# Patient Record
Sex: Female | Born: 1950 | Race: Black or African American | Hispanic: No | Marital: Married | Smoking: Never smoker
Health system: Southern US, Community
[De-identification: ages and names within clinical notes are randomized; demographics above are authoritative.]

## PROBLEM LIST (undated history)

## (undated) DIAGNOSIS — I10 Essential (primary) hypertension: Secondary | ICD-10-CM

## (undated) DIAGNOSIS — K219 Gastro-esophageal reflux disease without esophagitis: Secondary | ICD-10-CM

## (undated) DIAGNOSIS — M109 Gout, unspecified: Secondary | ICD-10-CM

## (undated) DIAGNOSIS — N183 Chronic kidney disease, stage 3 unspecified: Secondary | ICD-10-CM

## (undated) DIAGNOSIS — T7840XA Allergy, unspecified, initial encounter: Secondary | ICD-10-CM

## (undated) DIAGNOSIS — D649 Anemia, unspecified: Secondary | ICD-10-CM

## (undated) DIAGNOSIS — F419 Anxiety disorder, unspecified: Secondary | ICD-10-CM

## (undated) DIAGNOSIS — M1990 Unspecified osteoarthritis, unspecified site: Secondary | ICD-10-CM

## (undated) DIAGNOSIS — E538 Deficiency of other specified B group vitamins: Secondary | ICD-10-CM

## (undated) HISTORY — DX: Anxiety disorder, unspecified: F41.9

## (undated) HISTORY — DX: Deficiency of other specified B group vitamins: E53.8

## (undated) HISTORY — DX: Allergy, unspecified, initial encounter: T78.40XA

## (undated) HISTORY — PX: TUBAL LIGATION: SHX77

## (undated) HISTORY — DX: Essential (primary) hypertension: I10

## (undated) HISTORY — DX: Gout, unspecified: M10.9

## (undated) HISTORY — DX: Chronic kidney disease, stage 3 unspecified: N18.30

## (undated) HISTORY — DX: Gastro-esophageal reflux disease without esophagitis: K21.9

---

## 2002-06-25 ENCOUNTER — Ambulatory Visit (HOSPITAL_COMMUNITY): Admission: RE | Admit: 2002-06-25 | Discharge: 2002-06-25 | Payer: Self-pay | Admitting: *Deleted

## 2002-06-25 ENCOUNTER — Encounter (INDEPENDENT_AMBULATORY_CARE_PROVIDER_SITE_OTHER): Payer: Self-pay

## 2006-05-04 ENCOUNTER — Emergency Department (HOSPITAL_COMMUNITY): Admission: EM | Admit: 2006-05-04 | Discharge: 2006-05-04 | Payer: Self-pay | Admitting: Family Medicine

## 2007-01-23 ENCOUNTER — Ambulatory Visit: Payer: Self-pay | Admitting: Family Medicine

## 2007-01-23 DIAGNOSIS — I10 Essential (primary) hypertension: Secondary | ICD-10-CM | POA: Insufficient documentation

## 2007-01-23 DIAGNOSIS — R519 Headache, unspecified: Secondary | ICD-10-CM | POA: Insufficient documentation

## 2007-01-23 DIAGNOSIS — R51 Headache: Secondary | ICD-10-CM | POA: Insufficient documentation

## 2007-02-02 ENCOUNTER — Ambulatory Visit: Payer: Self-pay | Admitting: Family Medicine

## 2007-02-10 ENCOUNTER — Encounter: Payer: Self-pay | Admitting: Family Medicine

## 2007-02-24 ENCOUNTER — Ambulatory Visit: Payer: Self-pay | Admitting: Family Medicine

## 2007-02-25 ENCOUNTER — Telehealth: Payer: Self-pay | Admitting: Family Medicine

## 2007-02-25 ENCOUNTER — Ambulatory Visit: Payer: Self-pay | Admitting: Family Medicine

## 2007-02-25 DIAGNOSIS — E538 Deficiency of other specified B group vitamins: Secondary | ICD-10-CM

## 2007-02-25 LAB — CONVERTED CEMR LAB
ALT: 13 units/L (ref 0–35)
Albumin: 2.9 g/dL — ABNORMAL LOW (ref 3.5–5.2)
BUN: 10 mg/dL (ref 6–23)
Basophils Absolute: 0.1 10*3/uL (ref 0.0–0.1)
Bilirubin, Direct: 0.1 mg/dL (ref 0.0–0.3)
Cholesterol: 105 mg/dL (ref 0–200)
Creatinine, Ser: 0.9 mg/dL (ref 0.4–1.2)
Eosinophils Absolute: 0.1 10*3/uL (ref 0.0–0.6)
Eosinophils Relative: 2 % (ref 0.0–5.0)
GFR calc non Af Amer: 69 mL/min
Hemoglobin: 11.8 g/dL — ABNORMAL LOW (ref 12.0–15.0)
LDL Cholesterol: 71 mg/dL (ref 0–99)
Lymphocytes Relative: 24.9 % (ref 12.0–46.0)
Neutro Abs: 4.3 10*3/uL (ref 1.4–7.7)
Neutrophils Relative %: 68 % (ref 43.0–77.0)
Platelets: 347 10*3/uL (ref 150–400)
Potassium: 3.8 meq/L (ref 3.5–5.1)
RDW: 13.2 % (ref 11.5–14.6)
TSH: 0.63 microintl units/mL (ref 0.35–5.50)
Total CHOL/HDL Ratio: 4.3
Total Protein: 7 g/dL (ref 6.0–8.3)
Triglycerides: 48 mg/dL (ref 0–149)
VLDL: 10 mg/dL (ref 0–40)

## 2007-02-27 ENCOUNTER — Encounter: Payer: Self-pay | Admitting: Family Medicine

## 2007-02-27 ENCOUNTER — Ambulatory Visit: Payer: Self-pay

## 2007-02-27 ENCOUNTER — Telehealth: Payer: Self-pay | Admitting: Family Medicine

## 2007-03-06 ENCOUNTER — Ambulatory Visit: Payer: Self-pay | Admitting: Family Medicine

## 2007-03-16 ENCOUNTER — Ambulatory Visit: Payer: Self-pay | Admitting: Family Medicine

## 2007-03-23 ENCOUNTER — Ambulatory Visit: Payer: Self-pay | Admitting: Family Medicine

## 2007-03-30 ENCOUNTER — Ambulatory Visit: Payer: Self-pay | Admitting: Family Medicine

## 2007-04-01 ENCOUNTER — Ambulatory Visit: Payer: Self-pay | Admitting: Family Medicine

## 2007-04-01 DIAGNOSIS — R531 Weakness: Secondary | ICD-10-CM

## 2007-04-02 LAB — CONVERTED CEMR LAB: Vitamin B-12: >1500 pg/mL — ABNORMAL HIGH (ref 211–911)

## 2007-04-06 ENCOUNTER — Ambulatory Visit: Payer: Self-pay | Admitting: Cardiology

## 2007-04-13 ENCOUNTER — Ambulatory Visit: Payer: Self-pay | Admitting: Family Medicine

## 2007-04-13 LAB — CONVERTED CEMR LAB
Bilirubin Urine: NEGATIVE
pH: 7

## 2007-04-14 LAB — CONVERTED CEMR LAB
Alkaline Phosphatase: 76 units/L (ref 39–117)
BUN: 11 mg/dL (ref 6–23)
Basophils Absolute: 0 10*3/uL (ref 0.0–0.1)
Basophils Relative: 0.2 % (ref 0.0–1.0)
Bilirubin, Direct: 0.1 mg/dL (ref 0.0–0.3)
Calcium: 8.9 mg/dL (ref 8.4–10.5)
Chloride: 98 meq/L (ref 96–112)
Cholesterol: 121 mg/dL (ref 0–200)
Creatinine, Ser: 0.8 mg/dL (ref 0.4–1.2)
Eosinophils Absolute: 0.1 10*3/uL (ref 0.0–0.6)
Eosinophils Relative: 1.6 % (ref 0.0–5.0)
Glucose, Bld: 93 mg/dL (ref 70–99)
HCT: 35.1 % — ABNORMAL LOW (ref 36.0–46.0)
Lymphocytes Relative: 29.5 % (ref 12.0–46.0)
Monocytes Relative: 7.9 % (ref 3.0–11.0)
Neutrophils Relative %: 60.8 % (ref 43.0–77.0)
TSH: 1.19 microintl units/mL (ref 0.35–5.50)
Total CHOL/HDL Ratio: 3.9
VLDL: 11 mg/dL (ref 0–40)
WBC: 6.5 10*3/uL (ref 4.5–10.5)

## 2007-04-20 ENCOUNTER — Ambulatory Visit: Payer: Self-pay | Admitting: Family Medicine

## 2007-04-20 DIAGNOSIS — F411 Generalized anxiety disorder: Secondary | ICD-10-CM | POA: Insufficient documentation

## 2007-04-27 ENCOUNTER — Encounter: Payer: Self-pay | Admitting: Family Medicine

## 2007-04-27 ENCOUNTER — Ambulatory Visit: Payer: Self-pay | Admitting: Family Medicine

## 2007-04-27 ENCOUNTER — Other Ambulatory Visit: Admission: RE | Admit: 2007-04-27 | Discharge: 2007-04-27 | Payer: Self-pay | Admitting: Family Medicine

## 2007-05-05 ENCOUNTER — Encounter: Payer: Self-pay | Admitting: Family Medicine

## 2007-05-13 ENCOUNTER — Telehealth: Payer: Self-pay | Admitting: Family Medicine

## 2007-05-28 ENCOUNTER — Ambulatory Visit: Payer: Self-pay | Admitting: Family Medicine

## 2007-06-04 ENCOUNTER — Ambulatory Visit: Payer: Self-pay | Admitting: Gastroenterology

## 2007-06-22 ENCOUNTER — Ambulatory Visit: Payer: Self-pay | Admitting: Family Medicine

## 2007-07-01 ENCOUNTER — Ambulatory Visit: Payer: Self-pay | Admitting: Family Medicine

## 2007-07-13 ENCOUNTER — Encounter: Payer: Self-pay | Admitting: Gastroenterology

## 2007-07-13 ENCOUNTER — Ambulatory Visit: Payer: Self-pay | Admitting: Gastroenterology

## 2007-07-13 ENCOUNTER — Encounter: Payer: Self-pay | Admitting: Family Medicine

## 2007-07-13 HISTORY — PX: COLONOSCOPY: SHX174

## 2007-07-22 ENCOUNTER — Ambulatory Visit: Payer: Self-pay | Admitting: Family Medicine

## 2007-08-19 ENCOUNTER — Ambulatory Visit: Payer: Self-pay | Admitting: Family Medicine

## 2007-10-02 ENCOUNTER — Ambulatory Visit: Payer: Self-pay | Admitting: Family Medicine

## 2007-10-09 ENCOUNTER — Ambulatory Visit: Payer: Self-pay | Admitting: Family Medicine

## 2007-11-09 ENCOUNTER — Ambulatory Visit: Payer: Self-pay | Admitting: Family Medicine

## 2007-12-07 ENCOUNTER — Ambulatory Visit: Payer: Self-pay | Admitting: Family Medicine

## 2008-01-08 ENCOUNTER — Ambulatory Visit: Payer: Self-pay | Admitting: Family Medicine

## 2008-02-08 ENCOUNTER — Ambulatory Visit: Payer: Self-pay | Admitting: Family Medicine

## 2008-03-07 ENCOUNTER — Ambulatory Visit: Payer: Self-pay | Admitting: Family Medicine

## 2008-03-07 DIAGNOSIS — J351 Hypertrophy of tonsils: Secondary | ICD-10-CM | POA: Insufficient documentation

## 2008-04-04 ENCOUNTER — Ambulatory Visit: Payer: Self-pay | Admitting: Family Medicine

## 2008-05-06 ENCOUNTER — Ambulatory Visit: Payer: Self-pay | Admitting: Family Medicine

## 2008-06-10 ENCOUNTER — Ambulatory Visit: Payer: Self-pay | Admitting: Family Medicine

## 2008-07-08 ENCOUNTER — Ambulatory Visit: Payer: Self-pay | Admitting: Family Medicine

## 2008-08-12 ENCOUNTER — Ambulatory Visit: Payer: Self-pay | Admitting: Family Medicine

## 2008-09-09 ENCOUNTER — Ambulatory Visit: Payer: Self-pay | Admitting: Family Medicine

## 2008-10-12 ENCOUNTER — Ambulatory Visit: Payer: Self-pay | Admitting: Family Medicine

## 2008-11-10 ENCOUNTER — Ambulatory Visit: Payer: Self-pay | Admitting: Family Medicine

## 2008-12-13 ENCOUNTER — Ambulatory Visit: Payer: Self-pay | Admitting: Family Medicine

## 2008-12-23 ENCOUNTER — Ambulatory Visit: Payer: Self-pay | Admitting: Family Medicine

## 2008-12-23 LAB — CONVERTED CEMR LAB
Bilirubin Urine: NEGATIVE
Ketones, urine, test strip: NEGATIVE
Protein, U semiquant: NEGATIVE
WBC Urine, dipstick: NEGATIVE
pH: 5.5

## 2008-12-26 LAB — CONVERTED CEMR LAB
AST: 16 units/L (ref 0–37)
Alkaline Phosphatase: 80 units/L (ref 39–117)
BUN: 14 mg/dL (ref 6–23)
Basophils Absolute: 0.2 10*3/uL — ABNORMAL HIGH (ref 0.0–0.1)
Basophils Relative: 2.4 % (ref 0.0–3.0)
CO2: 34 meq/L — ABNORMAL HIGH (ref 19–32)
Chloride: 105 meq/L (ref 96–112)
Cholesterol: 112 mg/dL (ref 0–200)
Creatinine, Ser: 0.9 mg/dL (ref 0.4–1.2)
Eosinophils Relative: 1.8 % (ref 0.0–5.0)
GFR calc non Af Amer: 82.96 mL/min (ref >60)
HCT: 36.1 % (ref 36.0–46.0)
HDL: 33.2 mg/dL — ABNORMAL LOW (ref >39.00)
Hemoglobin: 12 g/dL (ref 12.0–15.0)
LDL Cholesterol: 68 mg/dL (ref 0–99)
MCV: 90.6 fL (ref 78.0–100.0)
Monocytes Absolute: 0.5 10*3/uL (ref 0.1–1.0)
RBC: 3.99 M/uL (ref 3.87–5.11)
Sodium: 144 meq/L (ref 135–145)
Total Bilirubin: 0.8 mg/dL (ref 0.3–1.2)
Total CHOL/HDL Ratio: 3
Triglycerides: 56 mg/dL (ref 0.0–149.0)
WBC: 6.4 10*3/uL (ref 4.5–10.5)

## 2009-01-02 ENCOUNTER — Encounter: Payer: Self-pay | Admitting: Family Medicine

## 2009-01-02 ENCOUNTER — Ambulatory Visit: Payer: Self-pay | Admitting: Family Medicine

## 2009-01-02 ENCOUNTER — Other Ambulatory Visit: Admission: RE | Admit: 2009-01-02 | Discharge: 2009-01-02 | Payer: Self-pay | Admitting: Family Medicine

## 2009-01-05 ENCOUNTER — Encounter: Payer: Self-pay | Admitting: Family Medicine

## 2009-01-11 ENCOUNTER — Ambulatory Visit: Payer: Self-pay | Admitting: Family Medicine

## 2009-02-09 ENCOUNTER — Ambulatory Visit: Payer: Self-pay | Admitting: Family Medicine

## 2009-03-24 ENCOUNTER — Ambulatory Visit: Payer: Self-pay | Admitting: Family Medicine

## 2009-05-12 ENCOUNTER — Ambulatory Visit: Payer: Self-pay | Admitting: Family Medicine

## 2009-06-16 ENCOUNTER — Ambulatory Visit: Payer: Self-pay | Admitting: Family Medicine

## 2009-07-09 ENCOUNTER — Emergency Department (HOSPITAL_COMMUNITY): Admission: EM | Admit: 2009-07-09 | Discharge: 2009-07-09 | Payer: Self-pay | Admitting: Family Medicine

## 2009-07-21 ENCOUNTER — Ambulatory Visit: Payer: Self-pay | Admitting: Family Medicine

## 2009-08-18 ENCOUNTER — Ambulatory Visit: Payer: Self-pay | Admitting: Family Medicine

## 2009-09-15 ENCOUNTER — Ambulatory Visit: Payer: Self-pay | Admitting: Family Medicine

## 2009-10-12 ENCOUNTER — Ambulatory Visit: Payer: Self-pay | Admitting: Family Medicine

## 2009-11-17 ENCOUNTER — Ambulatory Visit: Payer: Self-pay | Admitting: Family Medicine

## 2009-12-27 ENCOUNTER — Ambulatory Visit: Payer: Self-pay | Admitting: Family Medicine

## 2010-01-23 ENCOUNTER — Telehealth: Payer: Self-pay | Admitting: Family Medicine

## 2010-01-24 ENCOUNTER — Telehealth: Payer: Self-pay | Admitting: Family Medicine

## 2010-01-29 ENCOUNTER — Ambulatory Visit: Payer: Self-pay | Admitting: Family Medicine

## 2010-02-01 ENCOUNTER — Telehealth: Payer: Self-pay | Admitting: Family Medicine

## 2010-02-01 LAB — CONVERTED CEMR LAB
ALT: 14 units/L (ref 0–35)
Albumin: 2.9 g/dL — ABNORMAL LOW (ref 3.5–5.2)
BUN: 14 mg/dL (ref 6–23)
Basophils Absolute: 0 10*3/uL (ref 0.0–0.1)
Bilirubin, Direct: 0 mg/dL (ref 0.0–0.3)
CO2: 34 meq/L — ABNORMAL HIGH (ref 19–32)
Eosinophils Absolute: 0.1 10*3/uL (ref 0.0–0.7)
Eosinophils Relative: 1.9 % (ref 0.0–5.0)
HCT: 35.4 % — ABNORMAL LOW (ref 36.0–46.0)
Hemoglobin: 12 g/dL (ref 12.0–15.0)
Leukocytes, UA: NEGATIVE
MCHC: 34 g/dL (ref 30.0–36.0)
MCV: 89 fL (ref 78.0–100.0)
Monocytes Absolute: 0.3 10*3/uL (ref 0.1–1.0)
Monocytes Relative: 4.6 % (ref 3.0–12.0)
Neutro Abs: 4.8 10*3/uL (ref 1.4–7.7)
Neutrophils Relative %: 67.8 % (ref 43.0–77.0)
Nitrite: NEGATIVE
Sodium: 140 meq/L (ref 135–145)
Total Bilirubin: 0.3 mg/dL (ref 0.3–1.2)
Total CHOL/HDL Ratio: 4
Total Protein: 7 g/dL (ref 6.0–8.3)
Triglycerides: 52 mg/dL (ref 0.0–149.0)
Urine Glucose: NEGATIVE mg/dL
WBC: 7.1 10*3/uL (ref 4.5–10.5)

## 2010-02-05 ENCOUNTER — Encounter: Payer: Self-pay | Admitting: Family Medicine

## 2010-02-05 ENCOUNTER — Ambulatory Visit: Payer: Self-pay | Admitting: Family Medicine

## 2010-02-07 ENCOUNTER — Telehealth: Payer: Self-pay | Admitting: Family Medicine

## 2010-03-07 ENCOUNTER — Ambulatory Visit: Payer: Self-pay | Admitting: Family Medicine

## 2010-04-04 ENCOUNTER — Ambulatory Visit: Payer: Self-pay | Admitting: Family Medicine

## 2010-05-11 ENCOUNTER — Ambulatory Visit
Admission: RE | Admit: 2010-05-11 | Discharge: 2010-05-11 | Payer: Self-pay | Source: Home / Self Care | Attending: Family Medicine | Admitting: Family Medicine

## 2010-05-15 NOTE — Assessment & Plan Note (Signed)
Summary: B12 INJ // RS   Nurse Visit   Allergies: No Known Drug Allergies  Medication Administration  Injection # 1:    Medication: Vit B12 1000 mcg    Diagnosis: VITAMIN B12 DEFICIENCY (ICD-266.2)    Route: IM    Site: L deltoid    Exp Date: 02/13    Lot #: [id number]    Mfr: American Regent    Patient tolerated injection without complications    Given by: Raechel Ache, RN (October 12, 2009 9:13 AM)  Orders Added: 1)  Vit B12 1000 mcg [J3420] 2)  Admin of Therapeutic Inj  intramuscular or subcutaneous [54098]

## 2010-05-15 NOTE — Assessment & Plan Note (Signed)
Summary: B12 INJ//CCM rt delt   Nurse Visit   Allergies: No Known Drug Allergies  Medication Administration  Injection # 1:    Medication: Vit B12 1000 mcg    Diagnosis: VITAMIN B12 DEFICIENCY (ICD-266.2)    Route: IM    Site: R deltoid    Exp Date: 09/2011    Lot #: [id number]    Mfr: American Regent    Patient tolerated injection without complications    Given by: Pura Spice, RN (December 27, 2009 2:57 PM)  Orders Added: 1)  Vit B12 1000 mcg [J3420] 2)  Admin of Therapeutic Inj  intramuscular or subcutaneous [16109]

## 2010-05-15 NOTE — Assessment & Plan Note (Signed)
Summary: b12 inj/njr   Nurse Visit   Allergies: No Known Drug Allergies  Medication Administration  Injection # 1:    Medication: Vit B12 1000 mcg    Diagnosis: VITAMIN B12 DEFICIENCY (ICD-266.2)    Route: IM    Site: L deltoid    Exp Date: 09/12    Lot #: [id number]    Mfr: American Regent    Patient tolerated injection without complications    Given by: Raechel Ache, RN (November 17, 2009 2:51 PM)  Orders Added: 1)  Vit B12 1000 mcg [J3420] 2)  Admin of Therapeutic Inj  intramuscular or subcutaneous [11914]

## 2010-05-15 NOTE — Assessment & Plan Note (Signed)
Summary: CPX--PAP//CCM   Vital Signs:  Patient profile:   60 year old female Height:      62 inches Weight:      216 pounds BMI:     39.65 O2 Sat:      95 % Temp:     98.4 degrees F Pulse rate:   66 / minute BP sitting:   114 / 80  (left arm) Cuff size:   large  Vitals Entered By: Pura Spice, RN (February 05, 2010 1:54 PM) CC: cpx no pap  Is Patient Diabetic? No   History of Present Illness: 60 yr old female for a cpx. She feels well and has no concerns.   Allergies (verified): 1)  ! Ace Inhibitors  Past History:  Past Medical History: Reviewed history from 07/01/2007 and no changes required. Headache Hypertension Anxiety B12 deficiency   Past Surgical History: Reviewed history from 01/02/2009 and no changes required. Tubal ligation colonoscopy 07-13-07 per Dr. Russella Dar, repeat 5 yrs  Family History: Reviewed history from 02/24/2007 and no changes required. Family History of Colon CA 1st degree relative <60 Family History of Endometrial cancer Family History Hypertension Family History Arthritis Family History Diabetes 1st degree relative  Social History: Reviewed history from 01/23/2007 and no changes required. Married Never Smoked Alcohol use-no Drug use-no Regular exercise-no  Review of Systems  The patient denies anorexia, fever, weight loss, weight gain, vision loss, decreased hearing, hoarseness, chest pain, syncope, dyspnea on exertion, peripheral edema, prolonged cough, headaches, hemoptysis, abdominal pain, melena, hematochezia, severe indigestion/heartburn, hematuria, incontinence, genital sores, muscle weakness, suspicious skin lesions, transient blindness, difficulty walking, depression, unusual weight change, abnormal bleeding, enlarged lymph nodes, angioedema, breast masses, and testicular masses.    Physical Exam  General:  overweight-appearing.   Head:  Normocephalic and atraumatic without obvious abnormalities. No apparent alopecia or  balding. Eyes:  No corneal or conjunctival inflammation noted. EOMI. Perrla. Funduscopic exam benign, without hemorrhages, exudates or papilledema. Vision grossly normal. Ears:  External ear exam shows no significant lesions or deformities.  Otoscopic examination reveals clear canals, tympanic membranes are intact bilaterally without bulging, retraction, inflammation or discharge. Hearing is grossly normal bilaterally. Nose:  External nasal examination shows no deformity or inflammation. Nasal mucosa are pink and moist without lesions or exudates. Mouth:  Oral mucosa and oropharynx without lesions or exudates.  Teeth in good repair. Neck:  No deformities, masses, or tenderness noted. Chest Wall:  No deformities, masses, or tenderness noted. Breasts:  No mass, nodules, thickening, tenderness, bulging, retraction, inflamation, nipple discharge or skin changes noted.   Lungs:  Normal respiratory effort, chest expands symmetrically. Lungs are clear to auscultation, no crackles or wheezes. Heart:  Normal rate and regular rhythm. S1 and S2 normal without gallop, murmur, click, rub or other extra sounds. EKG normal except first degree AV block Abdomen:  Bowel sounds positive,abdomen soft and non-tender without masses, organomegaly or hernias noted. Msk:  No deformity or scoliosis noted of thoracic or lumbar spine.   Pulses:  R and L carotid,radial,femoral,dorsalis pedis and posterior tibial pulses are full and equal bilaterally Extremities:  No clubbing, cyanosis, edema, or deformity noted with normal full range of motion of all joints.   Neurologic:  No cranial nerve deficits noted. Station and gait are normal. Plantar reflexes are down-going bilaterally. DTRs are symmetrical throughout. Sensory, motor and coordinative functions appear intact. Skin:  Intact without suspicious lesions or rashes Cervical Nodes:  No lymphadenopathy noted Axillary Nodes:  No palpable lymphadenopathy Inguinal Nodes:  No  significant adenopathy Psych:  Cognition and judgment appear intact. Alert and cooperative with normal attention span and concentration. No apparent delusions, illusions, hallucinations   Impression & Recommendations:  Problem # 1:  PHYSICAL EXAMINATION (ICD-V70.0)  Orders: EKG w/ Interpretation (93000)  Complete Medication List: 1)  Hydrochlorothiazide 25 Mg Tabs (Hydrochlorothiazide) .... Take 1 tab by mouth every morning 2)  Aspir-low 81 Mg Tbec (Aspirin) .Marland Kitchen.. 1 once daily pc 3)  Metoprolol Tartrate 50 Mg Tabs (Metoprolol tartrate) .Marland Kitchen.. 1 by mouth two times a day 4)  Norvasc 5 Mg Tabs (Amlodipine besylate) .... Once daily 5)  Klor-con M10 10 Meq Cr-tabs (Potassium chloride crys cr) .... Once daily  Other Orders: Venipuncture (46962) TLB-B12, Serum-Total ONLY (95284-X32)  Patient Instructions: 1)  Start on potassium daily.  2)  It is important that you exercise reguarly at least 20 minutes 5 times a week. If you develop chest pain, have severe difficulty breathing, or feel very tired, stop exercising immediately and seek medical attention.  3)  You need to lose weight. Consider a lower calorie diet and regular exercise.  4)  Please schedule a follow-up appointment in 6 months .  Prescriptions: KLOR-CON M10 10 MEQ CR-TABS (POTASSIUM CHLORIDE CRYS CR) once daily  #30 x 11   Entered and Authorized by:   Nelwyn Salisbury MD   Signed by:   Nelwyn Salisbury MD on 02/05/2010   Method used:   Electronically to        [facility] [phone]* (retail)       [address]       [place], [place]  [address]       Ph: [phone]       Fax: [phone]   RxID:   [id number]    Medication Administration  Injection # 1:    Medication: Vit B12 1000 mcg    Diagnosis: VITAMIN B12 DEFICIENCY (ICD-266.2)    Route: IM    Site: R deltoid    Exp Date: 10/2011    Lot #: [id number]    Mfr: American Regent    Patient tolerated injection without complications    Given by: Pura Spice, RN  (February 05, 2010 2:16 PM)  Orders Added: 1)  Est. Patient 40-64 years [99396] 2)  EKG w/ Interpretation [93000] 3)  Venipuncture [36415] 4)  TLB-B12, Serum-Total ONLY [01601-U93]  Appended Document: Orders Update     Clinical Lists Changes  Orders: Added new Service order of Specimen Handling (23557) - Signed

## 2010-05-15 NOTE — Assessment & Plan Note (Signed)
Summary: b12 inj//ccm   Nurse Visit   Allergies: No Known Drug Allergies  Medication Administration  Injection # 1:    Medication: Vit B12 1000 mcg    Diagnosis: VITAMIN B12 DEFICIENCY (ICD-266.2)    Route: IM    Site: L deltoid    Exp Date: 12/2010    Lot #: [id number]    Mfr: American Regent    Patient tolerated injection without complications    Given by: Duard Brady LPN (July 21, 1476 3:03 PM)  Orders Added: 1)  Vit B12 1000 mcg [J3420] 2)  Admin of Therapeutic Inj  intramuscular or subcutaneous [29562]

## 2010-05-15 NOTE — Assessment & Plan Note (Signed)
Summary: b-12 inj/cjr   Nurse Visit   Allergies: 1)  ! Ace Inhibitors  Medication Administration  Injection # 1:    Medication: Vit B12 1000 mcg    Diagnosis: VITAMIN B12 DEFICIENCY (ICD-266.2)    Route: IM    Site: L deltoid    Exp Date: 10/14/2011    Lot #: [id number]    Mfr: American Regent    Patient tolerated injection without complications    Given by: Romualdo Bolk, CMA (AAMA) (March 07, 2010 9:09 AM)  Orders Added: 1)  Vit B12 1000 mcg [J3420] 2)  Admin of Therapeutic Inj  intramuscular or subcutaneous [15176]

## 2010-05-15 NOTE — Progress Notes (Signed)
Summary: rx to [facility] [address]   Phone Note Call from Patient   Caller: Patient Summary of Call: wants norvasc called to [facility] [address]  [facility] [address] notified.................... Initial call taken by: Pura Spice, RN,  January 24, 2010 4:46 PM  Follow-up for Phone Call        done.     Prescriptions: NORVASC 5 MG  TABS (AMLODIPINE BESYLATE) once daily  #30 x 6   Entered by:   Pura Spice, RN   Authorized by:   Nelwyn Salisbury MD   Signed by:   Pura Spice, RN on 01/24/2010   Method used:   Electronically to        [facility] [phone]* (retail)       [address]       [place], [place]  [address]       Ph: [phone]       Fax: [phone]   RxID:   [id number]

## 2010-05-15 NOTE — Assessment & Plan Note (Signed)
Summary: B12 INJ // RS   Nurse Visit   Allergies: No Known Drug Allergies  Medication Administration  Injection # 1:    Medication: Vit B12 1000 mcg    Diagnosis: VITAMIN B12 DEFICIENCY (ICD-266.2)    Route: IM    Site: L deltoid    Exp Date: 9/12    Lot #: [id number]    Mfr: American Regent    Patient tolerated injection without complications    Given by: Alfred Levins, CMA (May 12, 2009 2:58 PM)  Orders Added: 1)  Vit B12 1000 mcg [J3420] 2)  Admin of Therapeutic Inj  intramuscular or subcutaneous [40981]

## 2010-05-15 NOTE — Assessment & Plan Note (Signed)
Summary: B-12/CJR   Nurse Visit   Allergies: No Known Drug Allergies  Medication Administration  Injection # 1:    Medication: Vit B12 1000 mcg    Diagnosis: VITAMIN B12 DEFICIENCY (ICD-266.2)    Route: IM    Site: L deltoid    Exp Date: 9/12    Lot #: [id number]    Mfr: American Regent    Patient tolerated injection without complications    Given by: Alfred Levins, CMA (June 16, 2009 2:52 PM)  Orders Added: 1)  Vit B12 1000 mcg [J3420] 2)  Admin of Therapeutic Inj  intramuscular or subcutaneous [09811]

## 2010-05-15 NOTE — Progress Notes (Signed)
Summary: rtc  Phone Note Call from Patient Call back at Work Phone [phone]   Caller: Patient Call For: Nelwyn Salisbury MD Summary of Call: pt is return gina call Initial call taken by: Heron Sabins,  February 01, 2010 10:29 AM  Follow-up for Phone Call        pt called at number requested and a mess left at work to return call  Follow-up by: Pura Spice, RN,  February 01, 2010 10:39 AM  Additional Follow-up for Phone Call Additional follow up Details #1::        called pt again at home -messs left to return call.  Additional Follow-up by: Pura Spice, RN,  February 02, 2010 8:34 AM    Additional Follow-up for Phone Call Additional follow up Details #2::    pt has appt monday 02-05-10 will discusss lab then  Follow-up by: Pura Spice, RN,  February 02, 2010 10:01 AM

## 2010-05-15 NOTE — Assessment & Plan Note (Signed)
Summary: B-12 INJ/CJR   Nurse Visit   Allergies: No Known Drug Allergies  Medication Administration  Injection # 1:    Medication: Vit B12 1000 mcg    Diagnosis: VITAMIN B12 DEFICIENCY (ICD-266.2)    Route: IM    Site: R deltoid    Exp Date: 09/12    Lot #: [id number]    Mfr: American Regent    Patient tolerated injection without complications    Given by: Raechel Ache, RN (September 15, 2009 2:54 PM)  Orders Added: 1)  Vit B12 1000 mcg [J3420] 2)  Admin of Therapeutic Inj  intramuscular or subcutaneous [06301]

## 2010-05-15 NOTE — Progress Notes (Signed)
Summary: b12 inj   Phone Note Call from Patient   Caller: Patient Summary of Call: pt wants to know if she needs to  cont b12 inj ?  Initial call taken by: Pura Spice, RN,  February 07, 2010 11:21 AM  Follow-up for Phone Call        yes she needs to stay on monthly B12 shots because her level would drop again if we stopped  Follow-up by: Nelwyn Salisbury MD,  February 07, 2010 12:29 PM  Additional Follow-up for Phone Call Additional follow up Details #1::        pt notifed  Additional Follow-up by: Pura Spice, RN,  February 07, 2010 12:52 PM

## 2010-05-15 NOTE — Assessment & Plan Note (Signed)
Summary: b12 inj//ccm   Nurse Visit   Allergies: No Known Drug Allergies  Medication Administration  Injection # 1:    Medication: Vit B12 1000 mcg    Diagnosis: VITAMIN B12 DEFICIENCY (ICD-266.2)    Route: IM    Site: L deltoid    Exp Date: 09/12    Lot #: [id number]    Mfr: American Regent    Patient tolerated injection without complications    Given by: Raechel Ache, RN (Aug 18, 2009 9:08 AM)  Orders Added: 1)  Vit B12 1000 mcg [J3420] 2)  Admin of Therapeutic Inj  intramuscular or subcutaneous [58527]

## 2010-05-15 NOTE — Progress Notes (Signed)
Summary: Rx Refill  Phone Note Refill Request Call back at Work Phone [phone] Call back at [phone] wk Message from:  Patient on January 23, 2010 12:51 PM  Refills Requested: Medication #1:  NORVASC 5 MG  TABS once daily. pt has appt scheduled next week for cpx however took last pill today   Method Requested: Electronic Initial call taken by: Trixie Dredge,  January 23, 2010 12:53 PM  Follow-up for Phone Call        done [facility] to [facility] [address]  Follow-up by: Pura Spice, RN,  January 23, 2010 1:16 PM    New/Updated Medications: NORVASC 5 MG  TABS (AMLODIPINE BESYLATE) once daily Prescriptions: NORVASC 5 MG  TABS (AMLODIPINE BESYLATE) once daily  #30 x 6   Entered by:   Pura Spice, RN   Authorized by:   Nelwyn Salisbury MD   Signed by:   Pura Spice, RN on 01/23/2010   Method used:   [facility]        [facility]  [address] [id number]       [address]       [place], [place]  [address]       Ph: [phone]       Fax: [phone]   RxID:   [id number]

## 2010-05-17 NOTE — Assessment & Plan Note (Signed)
Summary: b12 inj//ccm rt deltoid    Nurse Visit   Allergies: 1)  ! Ace Inhibitors  Medication Administration  Injection # 1:    Medication: Vit B12 1000 mcg    Diagnosis: VITAMIN B12 DEFICIENCY (ICD-266.2)    Route: IM    Site: R deltoid    Exp Date: 01/2012    Lot #: [id number]    Mfr: American Regent    Patient tolerated injection without complications    Given by: Pura Spice, RN (April 04, 2010 10:33 AM)  Orders Added: 1)  Vit B12 1000 mcg [J3420] 2)  Admin of Therapeutic Inj  intramuscular or subcutaneous [04540]

## 2010-05-17 NOTE — Assessment & Plan Note (Signed)
Summary: B12 INJ/PT WILL BE HERE 4PM OK PER GINA/NJR left delt   Nurse Visit   Allergies: 1)  ! Ace Inhibitors  Medication Administration  Injection # 1:    Medication: Vit B12 1000 mcg    Diagnosis: VITAMIN B12 DEFICIENCY (ICD-266.2)    Route: IM    Site: L deltoid    Exp Date: 11/2011    Lot #: [id number]    Mfr: American Regent    Patient tolerated injection without complications    Given by: Pura Spice, RN (May 11, 2010 4:05 PM)  Orders Added: 1)  Vit B12 1000 mcg [J3420] 2)  Admin of Therapeutic Inj  intramuscular or subcutaneous [16109]

## 2010-07-05 ENCOUNTER — Encounter: Payer: Self-pay | Admitting: Family Medicine

## 2010-07-06 ENCOUNTER — Ambulatory Visit (INDEPENDENT_AMBULATORY_CARE_PROVIDER_SITE_OTHER): Payer: BC Managed Care – PPO | Admitting: Family Medicine

## 2010-07-06 DIAGNOSIS — D518 Other vitamin B12 deficiency anemias: Secondary | ICD-10-CM

## 2010-07-06 DIAGNOSIS — D519 Vitamin B12 deficiency anemia, unspecified: Secondary | ICD-10-CM

## 2010-07-06 MED ORDER — CYANOCOBALAMIN 1000 MCG/ML IJ SOLN
1000.0000 ug | Freq: Once | INTRAMUSCULAR | Status: AC
  Administered 2010-07-06: 1000 ug via INTRAMUSCULAR

## 2010-08-03 ENCOUNTER — Ambulatory Visit (INDEPENDENT_AMBULATORY_CARE_PROVIDER_SITE_OTHER): Payer: BC Managed Care – PPO | Admitting: Family Medicine

## 2010-08-03 DIAGNOSIS — D649 Anemia, unspecified: Secondary | ICD-10-CM

## 2010-08-03 MED ORDER — CYANOCOBALAMIN 1000 MCG/ML IJ SOLN
1000.0000 ug | Freq: Once | INTRAMUSCULAR | Status: AC
  Administered 2010-08-03: 1000 ug via INTRAMUSCULAR

## 2010-08-21 ENCOUNTER — Telehealth: Payer: Self-pay | Admitting: *Deleted

## 2010-08-21 MED ORDER — AMLODIPINE BESYLATE 5 MG PO TABS: 5.0000 mg | ORAL_TABLET | Freq: Every day | ORAL | Status: DC

## 2010-08-21 NOTE — Telephone Encounter (Signed)
Pt called to request Rx refill for Amlodipine to [facility] [facility].

## 2010-08-31 NOTE — H&P (Signed)
NAME:  Kiara, Ramirez                           ACCOUNT NO.:  [id number]   MEDICAL RECORD NO.:  [id number]                   PATIENT TYPE:  AMB   LOCATION:  SDC                                  FACILITY:  [facility]   PHYSICIAN:  Gerri Spore B. Earlene Plater, M.D.               DATE OF BIRTH:  12-Apr-1951   DATE OF ADMISSION:  DATE OF DISCHARGE:                                HISTORY & PHYSICAL   CHIEF COMPLAINT:  Menorrhagia and irregular bleeding with associated anemia.   HISTORY OF PRESENT ILLNESS:  This 60 year old African-American female  gravida 2, para 2, initially seen in the office at request of Dr. Perrin Maltese for  evaluation of irregular bleeding.  She had been without menses for about six  months previously.  However, for two months prior to being evaluated had had  heavy, irregular vaginal bleeding for up to three weeks at a time.  Has had  no vasomotor symptoms and an associated crampy, lower abdominal pain and  associated fatigue.  Has been found to be anemic and is on iron replacement  twice daily.  Has had recent Pap smear that was normal and TSH normal.  Pelvic ultrasound in the office showed a thickened endometrium with an area  that was hyperechoic and suggestive of endometrial polyp and also multiple  uterine fibroids in the 2-6 cm range noted.   Management options were discussed with the patient including saline infusion  ultrasound, hysteroscopy, or hysterectomy after a benign endometrial biopsy.  The patient's main concern was to rule out a cancer and was not yet ready  for definitive surgical therapy.  After all the options were discussed with  the patient, she decided that for her the best course would be the  hysteroscopy D&C and biopsy of any abnormal area that was visualized.  If a  polyp is identified, it would be removed at the same time.   PAST MEDICAL HISTORY:  Anemia.   PAST SURGICAL HISTORY:  Tubal ligation.   OBSTETRICAL HISTORY:  Vaginal delivery x2.   CURRENT  MEDICATIONS:  Ferrous sulfate 325 mg p.o. daily.   ALLERGIES:  None.   SOCIAL HISTORY:  No alcohol, tobacco, or drugs.   FAMILY HISTORY:  Mother with hypertension and heart failure, renal disease,  diabetes, and liver disease.  Rest is still otherwise noncontributory.   PHYSICAL EXAMINATION:  VITAL SIGNS:  Blood pressure 148/94, weight 206,  height 5 feet 2 inches.  GENERAL:  Alert and oriented, in no acute distress.  SKIN:  Warm, no lesions.  HEART:  Regular rate and rhythm, was clear to auscultation.  ABDOMEN:  Obese.  Liver, spleen normal.  No hernia.  PELVIC:  External genitalia, vagina, cervix normal.  Uterus enlarged, about  15 weeks' size consistent with known fibroids.  No adnexal mass or  tenderness noted.   ASSESSMENT:  Irregular bleeding with associated anemia.  This is at least  in  part due to uterine fibroids but also focally thickened endometrial stripe  suggestive of endometrial polyp.  The patient elects to proceed directly to  hysteroscopy dilatation and curettage for complete visualization of the  endometrial cavity as opposed to initially evaluating with saline sonogram  and/or endometrial biopsy.  Operative risks discussed including infection,  bleeding, uterine perforation, damage to bowel, bladder, or surrounding  organs.  All questions answered.  The patient wishes to proceed.                                               Gerri Spore B. Earlene Plater, M.D.    WBD/MEDQ  D:  06/22/2002  T:  06/22/2002  Job:  [id number]   cc:   Jonita Albee, M.D.  [facility]  [address]  [place]  [place] [address]  Fax: [phone]

## 2010-08-31 NOTE — Op Note (Signed)
NAMEALEXIA, Kiara Ramirez                             ACCOUNT NO.:  [id number]   MEDICAL RECORD NO.:  [id number]                   PATIENT TYPE:  AMB   LOCATION:  SDC                                  FACILITY:  [facility]   PHYSICIAN:  [place] B. Earlene Plater, M.D.               DATE OF BIRTH:  10-24-1950   DATE OF PROCEDURE:  06/25/2002  DATE OF DISCHARGE:                                 OPERATIVE REPORT   PREOPERATIVE DIAGNOSES:  1. Abnormal bleeding.  2. Uterine fibroids.  3. Questionable endometrial polyp on ultrasound.   POSTOPERATIVE DIAGNOSES:  1. Abnormal bleeding.  2. Uterine fibroids.  3. Questionable endometrial polyp on ultrasound.   PROCEDURE:  Hysteroscopy dilatation and curettage.   SURGEON:  Chester Holstein. Earlene Plater, M.D.   ANESTHESIA:  MAC and 10 cc of 1% Nesacaine paracervical block.   FINDINGS:  Eighteen-week size fibroid uterus on examination under  anesthesia.  Hysteroscopic findings shaggy endometrium, large submucosal  fibroid.   SPECIMENS:  Endometrial curettings.   FLUID DEFICIT:  50 mL of sorbitol.   IV FLUIDS:  1000 mL.   ESTIMATED BLOOD LOSS:  50 mL.   URINE OUTPUT:  75 mL preop.   COMPLICATIONS:  None.   INDICATIONS FOR PROCEDURE:  Abnormal bleeding, ultrasound showing multiple  uterine fibroids and an intracavitary mass suggestive of polyp or submucosal  fibroid.  The patient decline saline sonogram as an intermediate diagnostic  step.   PROCEDURE:  The patient was taken to the operating room and MAC anesthesia  obtained.  She was placed in the ski position and prepped and draped in  standard fashion.  The bladder was emptied with a red rubber catheter.  Examination under anesthesia showed the findings as outlined above.   The cervix was blocked with a paracervical block in standard fashion.  The  anterior lip of the cervix was grasped with a toothed tenaculum and the  cervix easily dilated to a #21.  The diagnostic hysteroscope was inserted  after being  flushed with sorbitol.  The endometrial cavity was inspected  with the above findings noted.  The patient was bleeding slightly and this  did limit visualization somewhat.  However, ___________ revealed a large  submucosal fibroid.  Tubal ostia were not visualized due the above  limitations.  Given the shaggy endometrium and no focal endometrial  abnormality noted, gentle sharp curettage was performed.  The instruments  were removed and the cervix was hemostatic.  The patient was taken to the  recovery room awake, alert, and in stable condition.                                               Gerri Spore B. Earlene Plater, M.D.    WBD/MEDQ  D:  06/25/2002  T:  06/25/2002  Job:  [id number]

## 2010-09-07 ENCOUNTER — Ambulatory Visit (INDEPENDENT_AMBULATORY_CARE_PROVIDER_SITE_OTHER): Payer: BC Managed Care – PPO | Admitting: Family Medicine

## 2010-09-07 DIAGNOSIS — E538 Deficiency of other specified B group vitamins: Secondary | ICD-10-CM

## 2010-09-07 MED ORDER — CYANOCOBALAMIN 1000 MCG/ML IJ SOLN
1000.0000 ug | Freq: Once | INTRAMUSCULAR | Status: AC
  Administered 2010-09-07: 1000 ug via INTRAMUSCULAR

## 2010-10-19 ENCOUNTER — Ambulatory Visit (INDEPENDENT_AMBULATORY_CARE_PROVIDER_SITE_OTHER): Payer: BC Managed Care – PPO | Admitting: Family Medicine

## 2010-10-19 DIAGNOSIS — E538 Deficiency of other specified B group vitamins: Secondary | ICD-10-CM

## 2010-10-19 MED ORDER — CYANOCOBALAMIN 1000 MCG/ML IJ SOLN
1000.0000 ug | Freq: Once | INTRAMUSCULAR | Status: AC
  Administered 2010-10-19: 1000 ug via INTRAMUSCULAR

## 2010-11-23 ENCOUNTER — Encounter: Payer: Self-pay | Admitting: Family Medicine

## 2010-11-26 ENCOUNTER — Ambulatory Visit (INDEPENDENT_AMBULATORY_CARE_PROVIDER_SITE_OTHER): Payer: BC Managed Care – PPO | Admitting: Family Medicine

## 2010-11-26 DIAGNOSIS — E539 Vitamin B deficiency, unspecified: Secondary | ICD-10-CM

## 2010-11-26 MED ORDER — CYANOCOBALAMIN 1000 MCG/ML IJ SOLN
1000.0000 ug | Freq: Once | INTRAMUSCULAR | Status: AC
  Administered 2010-11-26: 1000 ug via INTRAMUSCULAR

## 2010-12-25 ENCOUNTER — Other Ambulatory Visit: Payer: Self-pay | Admitting: Family Medicine

## 2010-12-26 ENCOUNTER — Ambulatory Visit (INDEPENDENT_AMBULATORY_CARE_PROVIDER_SITE_OTHER): Payer: BC Managed Care – PPO | Admitting: Family Medicine

## 2010-12-26 DIAGNOSIS — E539 Vitamin B deficiency, unspecified: Secondary | ICD-10-CM

## 2010-12-26 MED ORDER — CYANOCOBALAMIN 1000 MCG/ML IJ SOLN
1000.0000 ug | Freq: Once | INTRAMUSCULAR | Status: AC
  Administered 2010-12-26: 1000 ug via INTRAMUSCULAR

## 2010-12-26 NOTE — Telephone Encounter (Signed)
Script sent e-scribe 

## 2010-12-29 ENCOUNTER — Ambulatory Visit (INDEPENDENT_AMBULATORY_CARE_PROVIDER_SITE_OTHER): Payer: BC Managed Care – PPO | Admitting: Family Medicine

## 2010-12-29 ENCOUNTER — Encounter: Payer: Self-pay | Admitting: Family Medicine

## 2010-12-29 DIAGNOSIS — I10 Essential (primary) hypertension: Secondary | ICD-10-CM

## 2010-12-29 NOTE — Progress Notes (Signed)
  Subjective:    Patient ID: Kiara Ramirez, female    DOB: 02-Apr-1951, 60 y.o.   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 Patient presents with mild L leg pain. Present for approximately 2 months. Slightly worse today. 5/10 in intensity at its worst. The pain is somewhat poorly localized along the calf muscle. No muscle cramps. Achy quality. Denies any low back pain or any weakness or numbness. Nontender to palpation. Occasionally at rest but mostly with walking. She has not noted any color changes to her lower extremity and no visible swelling. No history of coagulopathy. B12 deficiency and takes monthly injections. Hypertension which has been well controlled.  Past Medical History  Diagnosis Date  . Hypertension   . Anxiety   . Headache   . B12 deficiency    Past Surgical History  Procedure Date  . Tubal ligation   . Colonoscopy 07/13/07    per Dr. Russella Dar, repeat in 5 yrs     reports that she has never smoked. She does not have any smokeless tobacco history on file. She reports that she does not drink alcohol or use illicit drugs. family history includes Arthritis in an unspecified family member; Cancer in an unspecified family member; Diabetes in an unspecified family member; and Hypertension in an unspecified family member. Allergies  Allergen Reactions  . Ace Inhibitors      Review of Systems  Constitutional: Negative for fever and chills.  Respiratory: Negative for shortness of breath.   Cardiovascular: Negative for chest pain.  Gastrointestinal: Negative for abdominal pain.  Musculoskeletal: Negative for myalgias, back pain, joint swelling, arthralgias and gait problem.  Skin: Negative for rash.  Neurological: Negative for dizziness, weakness and numbness.  Hematological: Negative for adenopathy.       Objective:   Physical Exam  Constitutional: She appears well-developed and well-nourished. No distress.  Cardiovascular: Normal rate, regular rhythm and normal heart  sounds.   No murmur heard. Pulmonary/Chest: Effort normal and breath sounds normal. No respiratory distress. She has no wheezes. She has no rales.  Musculoskeletal: She exhibits no edema.       Left leg reveals no edema. No color changes. Nontender to palpation. Warm to touch with good capillary refill. 2+ dorsalis pedis pulses bilaterally.  straight leg raise negative  Neurological:        Full strength lower extremities. Symmetric reflexes  Skin: No rash noted.          Assessment & Plan:  Left leg pain. No suspicion clinically for DVT. Nonfocal neurologic exam. Try Aleve 2 tablets every 12 hours as needed. Followup with primary if pain persists

## 2010-12-29 NOTE — Patient Instructions (Signed)
Consider over-the-counter Aleve one to 2 tablets every 12 hours as needed Followup promptly with Dr. Clent Ridges for any swelling, increased pain, or new symptoms.

## 2011-01-25 ENCOUNTER — Other Ambulatory Visit (INDEPENDENT_AMBULATORY_CARE_PROVIDER_SITE_OTHER): Payer: BC Managed Care – PPO

## 2011-01-25 ENCOUNTER — Telehealth: Payer: Self-pay | Admitting: Family Medicine

## 2011-01-25 DIAGNOSIS — E539 Vitamin B deficiency, unspecified: Secondary | ICD-10-CM

## 2011-01-25 DIAGNOSIS — Z0000 Encounter for general adult medical examination without abnormal findings: Secondary | ICD-10-CM

## 2011-01-25 LAB — LIPID PANEL
HDL: 33.1 mg/dL — ABNORMAL LOW (ref >39.00)
LDL Cholesterol: 65 mg/dL (ref 0–99)
Total CHOL/HDL Ratio: 3
VLDL: 14.2 mg/dL (ref 0.0–40.0)

## 2011-01-25 LAB — BASIC METABOLIC PANEL
CO2: 33 mEq/L — ABNORMAL HIGH (ref 19–32)
Calcium: 8.7 mg/dL (ref 8.4–10.5)
GFR: 82.36 mL/min (ref >60.00)
Glucose, Bld: 86 mg/dL (ref 70–99)
Potassium: 3.4 mEq/L — ABNORMAL LOW (ref 3.5–5.1)
Sodium: 142 mEq/L (ref 135–145)

## 2011-01-25 LAB — CBC WITH DIFFERENTIAL/PLATELET
Basophils Absolute: 0 10*3/uL (ref 0.0–0.1)
Basophils Relative: 0.6 % (ref 0.0–3.0)
Eosinophils Relative: 1.4 % (ref 0.0–5.0)
HCT: 38.6 % (ref 36.0–46.0)
Hemoglobin: 12.6 g/dL (ref 12.0–15.0)
Lymphocytes Relative: 22.3 % (ref 12.0–46.0)
Lymphs Abs: 1.5 10*3/uL (ref 0.7–4.0)
Monocytes Relative: 6 % (ref 3.0–12.0)
Neutro Abs: 4.6 10*3/uL (ref 1.4–7.7)
RBC: 4.34 Mil/uL (ref 3.87–5.11)
RDW: 14.3 % (ref 11.5–14.6)

## 2011-01-25 LAB — HEPATIC FUNCTION PANEL
ALT: 12 U/L (ref 0–35)
Bilirubin, Direct: 0 mg/dL (ref 0.0–0.3)
Total Bilirubin: 0.5 mg/dL (ref 0.3–1.2)

## 2011-01-25 LAB — POCT URINALYSIS DIPSTICK
Bilirubin, UA: NEGATIVE
Glucose, UA: NEGATIVE
Ketones, UA: NEGATIVE
Leukocytes, UA: NEGATIVE
Nitrite, UA: NEGATIVE
pH, UA: 7.5

## 2011-01-25 LAB — TSH: TSH: 1.37 u[IU]/mL (ref 0.35–5.50)

## 2011-01-25 LAB — VITAMIN B12: Vitamin B-12: 1036 pg/mL — ABNORMAL HIGH (ref 211–911)

## 2011-01-25 MED ORDER — CYANOCOBALAMIN 1000 MCG/ML IJ SOLN
1000.0000 ug | Freq: Once | INTRAMUSCULAR | Status: AC
  Administered 2011-01-25: 1000 ug via INTRAMUSCULAR

## 2011-01-25 NOTE — Telephone Encounter (Signed)
Spoke with pt and gave results. 

## 2011-01-25 NOTE — Telephone Encounter (Signed)
Message copied by Baldemar Friday on Fri Jan 25, 2011  5:03 PM ------      Message from: Gershon Crane A      Created: Fri Jan 25, 2011  4:38 PM       normal

## 2011-02-08 ENCOUNTER — Other Ambulatory Visit: Payer: Self-pay | Admitting: Family Medicine

## 2011-02-08 DIAGNOSIS — Z1231 Encounter for screening mammogram for malignant neoplasm of breast: Secondary | ICD-10-CM

## 2011-02-09 ENCOUNTER — Other Ambulatory Visit: Payer: Self-pay | Admitting: Family Medicine

## 2011-02-11 ENCOUNTER — Ambulatory Visit (INDEPENDENT_AMBULATORY_CARE_PROVIDER_SITE_OTHER): Payer: BC Managed Care – PPO | Admitting: Family Medicine

## 2011-02-11 ENCOUNTER — Encounter: Payer: Self-pay | Admitting: Family Medicine

## 2011-02-11 ENCOUNTER — Other Ambulatory Visit (HOSPITAL_COMMUNITY)
Admission: RE | Admit: 2011-02-11 | Discharge: 2011-02-11 | Disposition: A | Discharge status: Home / Self Care | Payer: BC Managed Care – PPO | Source: Ambulatory Visit | Attending: Family Medicine | Admitting: Family Medicine

## 2011-02-11 VITALS — BP 118/80 | HR 70 | Temp 98.3°F | Ht 62.25 in | Wt 219.0 lb

## 2011-02-11 DIAGNOSIS — Z0000 Encounter for general adult medical examination without abnormal findings: Secondary | ICD-10-CM

## 2011-02-11 DIAGNOSIS — Z01419 Encounter for gynecological examination (general) (routine) without abnormal findings: Secondary | ICD-10-CM | POA: Insufficient documentation

## 2011-02-11 MED ORDER — POTASSIUM CHLORIDE 10 MEQ PO TBCR: 10.0000 meq | EXTENDED_RELEASE_TABLET | Freq: Two times a day (BID) | ORAL | Status: DC

## 2011-02-11 MED ORDER — METOPROLOL TARTRATE 50 MG PO TABS: 50.0000 mg | ORAL_TABLET | Freq: Two times a day (BID) | ORAL | Status: DC

## 2011-02-11 MED ORDER — AMLODIPINE BESYLATE 5 MG PO TABS: 5.0000 mg | ORAL_TABLET | Freq: Every day | ORAL | Status: DC

## 2011-02-11 MED ORDER — HYDROCHLOROTHIAZIDE 25 MG PO TABS: 25.0000 mg | ORAL_TABLET | Freq: Every day | ORAL | Status: DC

## 2011-02-11 NOTE — Progress Notes (Signed)
Subjective:    Patient ID: Kiara Ramirez, female    DOB: 25-Feb-1951, 60 y.o.   MRN: [id number]  HPI 60 yr old female for a cpx. She feels fine and has no complaints. Her recent labs were notable for a low potassium at 3.4. She tries to watch her diet but does not get much exercise.    Review of Systems  Constitutional: Negative.  Negative for fever, diaphoresis, activity change, appetite change, fatigue and unexpected weight change.  HENT: Negative.  Negative for hearing loss, ear pain, nosebleeds, congestion, sore throat, trouble swallowing, neck pain, neck stiffness, voice change and tinnitus.   Eyes: Negative.  Negative for photophobia, pain, discharge, redness and visual disturbance.  Respiratory: Negative.  Negative for apnea, cough, choking, chest tightness, shortness of breath, wheezing and stridor.   Cardiovascular: Negative.  Negative for chest pain, palpitations and leg swelling.  Gastrointestinal: Negative.  Negative for nausea, vomiting, abdominal pain, diarrhea, constipation, blood in stool, abdominal distention and rectal pain.  Genitourinary: Negative.  Negative for dysuria, urgency, frequency, hematuria, flank pain, vaginal bleeding, vaginal discharge, enuresis, difficulty urinating, vaginal pain and menstrual problem.  Musculoskeletal: Negative.  Negative for myalgias, back pain, joint swelling, arthralgias and gait problem.  Skin: Negative.  Negative for color change, pallor, rash and wound.  Neurological: Negative.  Negative for dizziness, tremors, seizures, syncope, speech difficulty, weakness, light-headedness, numbness and headaches.  Hematological: Negative.  Negative for adenopathy. Does not bruise/bleed easily.  Psychiatric/Behavioral: Negative.  Negative for hallucinations, behavioral problems, confusion, sleep disturbance, dysphoric mood and agitation. The patient is not nervous/anxious.        Objective:   Physical Exam  Constitutional: She appears well-developed  and well-nourished. No distress.  HENT:  Head: Normocephalic and atraumatic.  Right Ear: External ear normal.  Left Ear: External ear normal.  Nose: Nose normal.  Mouth/Throat: Oropharynx is clear and moist. No oropharyngeal exudate.  Eyes: Conjunctivae and EOM are normal. Pupils are equal, round, and reactive to light. Right eye exhibits no discharge. Left eye exhibits no discharge. No scleral icterus.  Neck: Normal range of motion. Neck supple. No JVD present. No thyromegaly present.  Cardiovascular: Normal rate, regular rhythm, normal heart sounds and intact distal pulses.  Exam reveals no gallop and no friction rub.   No murmur heard.      EKG normal   Pulmonary/Chest: Effort normal and breath sounds normal. No stridor. No respiratory distress. She has no wheezes. She has no rales. She exhibits no tenderness.  Abdominal: Soft. Normal appearance and bowel sounds are normal. She exhibits no distension, no abdominal bruit, no ascites and no mass. There is no hepatosplenomegaly. There is no tenderness. There is no rigidity, no rebound and no guarding. No hernia.  Genitourinary: Rectum normal, vagina normal and uterus normal. No breast swelling, tenderness, discharge or bleeding. Cervix exhibits no motion tenderness, no discharge and no friability. Right adnexum displays no mass, no tenderness and no fullness. Left adnexum displays no mass, no tenderness and no fullness. No erythema, tenderness or bleeding around the vagina. No vaginal discharge found.  Musculoskeletal: Normal range of motion. She exhibits no edema and no tenderness.  Lymphadenopathy:    She has no cervical adenopathy.  Neurological: She is alert. She has normal reflexes. No cranial nerve deficit. She exhibits normal muscle tone. Coordination normal.  Skin: Skin is warm and dry. No rash noted. She is not diaphoretic. No erythema. No pallor.  Psychiatric: She has a normal mood and affect. Her behavior is normal.  Judgment and  thought content normal.          Assessment & Plan:  Well exam. Increase potassium to 2 a day. Increase exercise

## 2011-02-19 ENCOUNTER — Encounter: Payer: Self-pay | Admitting: Family Medicine

## 2011-02-22 ENCOUNTER — Encounter: Payer: BC Managed Care – PPO | Admitting: Family Medicine

## 2011-02-26 ENCOUNTER — Ambulatory Visit (INDEPENDENT_AMBULATORY_CARE_PROVIDER_SITE_OTHER): Payer: BC Managed Care – PPO | Admitting: Family Medicine

## 2011-02-26 DIAGNOSIS — E538 Deficiency of other specified B group vitamins: Secondary | ICD-10-CM

## 2011-02-27 MED ORDER — CYANOCOBALAMIN 1000 MCG/ML IJ SOLN
1000.0000 ug | Freq: Once | INTRAMUSCULAR | Status: AC
  Administered 2011-02-27: 1000 ug via INTRAMUSCULAR

## 2011-03-28 ENCOUNTER — Ambulatory Visit (INDEPENDENT_AMBULATORY_CARE_PROVIDER_SITE_OTHER): Payer: BC Managed Care – PPO | Admitting: Family Medicine

## 2011-03-28 DIAGNOSIS — E539 Vitamin B deficiency, unspecified: Secondary | ICD-10-CM

## 2011-03-28 MED ORDER — CYANOCOBALAMIN 1000 MCG/ML IJ SOLN
1000.0000 ug | Freq: Once | INTRAMUSCULAR | Status: AC
  Administered 2011-03-28: 1000 ug via INTRAMUSCULAR

## 2011-05-01 ENCOUNTER — Ambulatory Visit (INDEPENDENT_AMBULATORY_CARE_PROVIDER_SITE_OTHER): Payer: BC Managed Care – PPO | Admitting: Family Medicine

## 2011-05-01 DIAGNOSIS — E539 Vitamin B deficiency, unspecified: Secondary | ICD-10-CM

## 2011-05-01 MED ORDER — CYANOCOBALAMIN 1000 MCG/ML IJ SOLN
1000.0000 ug | Freq: Once | INTRAMUSCULAR | Status: AC
  Administered 2011-05-01: 1000 ug via INTRAMUSCULAR

## 2011-06-05 ENCOUNTER — Ambulatory Visit (INDEPENDENT_AMBULATORY_CARE_PROVIDER_SITE_OTHER): Payer: BC Managed Care – PPO | Admitting: Family Medicine

## 2011-06-05 DIAGNOSIS — E539 Vitamin B deficiency, unspecified: Secondary | ICD-10-CM

## 2011-06-05 MED ORDER — CYANOCOBALAMIN 1000 MCG/ML IJ SOLN
1000.0000 ug | Freq: Once | INTRAMUSCULAR | Status: AC
  Administered 2011-06-05: 1000 ug via INTRAMUSCULAR

## 2011-07-09 ENCOUNTER — Ambulatory Visit (INDEPENDENT_AMBULATORY_CARE_PROVIDER_SITE_OTHER): Payer: BC Managed Care – PPO | Admitting: Family Medicine

## 2011-07-09 DIAGNOSIS — E539 Vitamin B deficiency, unspecified: Secondary | ICD-10-CM

## 2011-07-09 MED ORDER — CYANOCOBALAMIN 1000 MCG/ML IJ SOLN
1000.0000 ug | Freq: Once | INTRAMUSCULAR | Status: AC
  Administered 2011-07-09: 1000 ug via INTRAMUSCULAR

## 2011-08-21 ENCOUNTER — Ambulatory Visit (INDEPENDENT_AMBULATORY_CARE_PROVIDER_SITE_OTHER): Payer: BC Managed Care – PPO | Admitting: Family Medicine

## 2011-08-21 DIAGNOSIS — E539 Vitamin B deficiency, unspecified: Secondary | ICD-10-CM

## 2011-08-21 MED ORDER — CYANOCOBALAMIN 1000 MCG/ML IJ SOLN
1000.0000 ug | Freq: Once | INTRAMUSCULAR | Status: AC
  Administered 2011-08-21: 1000 ug via INTRAMUSCULAR

## 2011-10-18 ENCOUNTER — Ambulatory Visit (INDEPENDENT_AMBULATORY_CARE_PROVIDER_SITE_OTHER): Payer: BC Managed Care – PPO | Admitting: Family Medicine

## 2011-10-18 DIAGNOSIS — E538 Deficiency of other specified B group vitamins: Secondary | ICD-10-CM

## 2011-10-18 MED ORDER — CYANOCOBALAMIN 1000 MCG/ML IJ SOLN
1000.0000 ug | Freq: Once | INTRAMUSCULAR | Status: AC
  Administered 2011-10-18: 1000 ug via INTRAMUSCULAR

## 2011-12-09 ENCOUNTER — Ambulatory Visit (INDEPENDENT_AMBULATORY_CARE_PROVIDER_SITE_OTHER): Payer: BC Managed Care – PPO | Admitting: Family Medicine

## 2011-12-09 DIAGNOSIS — E539 Vitamin B deficiency, unspecified: Secondary | ICD-10-CM

## 2011-12-09 MED ORDER — CYANOCOBALAMIN 1000 MCG/ML IJ SOLN
1000.0000 ug | Freq: Once | INTRAMUSCULAR | Status: AC
  Administered 2011-12-09: 1000 ug via INTRAMUSCULAR

## 2012-01-08 ENCOUNTER — Ambulatory Visit (INDEPENDENT_AMBULATORY_CARE_PROVIDER_SITE_OTHER): Payer: BC Managed Care – PPO | Admitting: Family Medicine

## 2012-01-08 DIAGNOSIS — E539 Vitamin B deficiency, unspecified: Secondary | ICD-10-CM

## 2012-01-08 MED ORDER — CYANOCOBALAMIN 1000 MCG/ML IJ SOLN
1000.0000 ug | Freq: Once | INTRAMUSCULAR | Status: AC
  Administered 2012-01-08: 1000 ug via INTRAMUSCULAR

## 2012-02-11 ENCOUNTER — Ambulatory Visit (INDEPENDENT_AMBULATORY_CARE_PROVIDER_SITE_OTHER): Payer: BC Managed Care – PPO | Admitting: Family Medicine

## 2012-02-11 DIAGNOSIS — E539 Vitamin B deficiency, unspecified: Secondary | ICD-10-CM

## 2012-02-11 MED ORDER — CYANOCOBALAMIN 1000 MCG/ML IJ SOLN
1000.0000 ug | Freq: Once | INTRAMUSCULAR | Status: AC
  Administered 2012-02-11: 1000 ug via INTRAMUSCULAR

## 2012-02-19 ENCOUNTER — Telehealth: Payer: Self-pay | Admitting: Family Medicine

## 2012-02-19 NOTE — Telephone Encounter (Signed)
Pt req refill of hydrochlorothiazide (HYDRODIURIL) 25 MG tablet,  amLODipine (NORVASC) 5 MG tablet, potassium chloride (KLOR-CON) 10 MEQ CR tablet,  metoprolol (LOPRESSOR) 50 MG tablet to [facility] at [employer] on [address]

## 2012-02-21 MED ORDER — METOPROLOL TARTRATE 50 MG PO TABS: 50.0000 mg | ORAL_TABLET | Freq: Two times a day (BID) | ORAL | Status: DC

## 2012-02-21 MED ORDER — HYDROCHLOROTHIAZIDE 25 MG PO TABS: 25.0000 mg | ORAL_TABLET | Freq: Every day | ORAL | Status: DC

## 2012-02-21 MED ORDER — POTASSIUM CHLORIDE ER 10 MEQ PO TBCR: 10.0000 meq | EXTENDED_RELEASE_TABLET | Freq: Every day | ORAL | Status: DC

## 2012-02-21 MED ORDER — AMLODIPINE BESYLATE 5 MG PO TABS: 5.0000 mg | ORAL_TABLET | Freq: Every day | ORAL | Status: DC

## 2012-02-21 NOTE — Telephone Encounter (Signed)
I sent all scripts e-scribe, pt does a office visit scheduled for December 2013.

## 2012-03-05 ENCOUNTER — Ambulatory Visit (INDEPENDENT_AMBULATORY_CARE_PROVIDER_SITE_OTHER): Payer: BC Managed Care – PPO | Admitting: Family Medicine

## 2012-03-05 ENCOUNTER — Encounter: Payer: Self-pay | Admitting: Family Medicine

## 2012-03-05 VITALS — BP 118/82 | HR 59 | Temp 97.9°F | Ht 63.25 in | Wt 213.0 lb

## 2012-03-05 DIAGNOSIS — E539 Vitamin B deficiency, unspecified: Secondary | ICD-10-CM

## 2012-03-05 DIAGNOSIS — Z0000 Encounter for general adult medical examination without abnormal findings: Secondary | ICD-10-CM

## 2012-03-05 DIAGNOSIS — Z136 Encounter for screening for cardiovascular disorders: Secondary | ICD-10-CM

## 2012-03-05 LAB — POCT URINALYSIS DIPSTICK
Bilirubin, UA: NEGATIVE
Blood, UA: NEGATIVE
Ketones, UA: NEGATIVE
Leukocytes, UA: NEGATIVE
Spec Grav, UA: 1.02
pH, UA: 6

## 2012-03-05 LAB — BASIC METABOLIC PANEL
Chloride: 100 mEq/L (ref 96–112)
GFR: 76.16 mL/min (ref >60.00)
Potassium: 3 mEq/L — ABNORMAL LOW (ref 3.5–5.1)
Sodium: 140 mEq/L (ref 135–145)

## 2012-03-05 LAB — LIPID PANEL
LDL Cholesterol: 70 mg/dL (ref 0–99)
VLDL: 10.2 mg/dL (ref 0.0–40.0)

## 2012-03-05 LAB — CBC WITH DIFFERENTIAL/PLATELET
Eosinophils Relative: 0.8 % (ref 0.0–5.0)
HCT: 37.7 % (ref 36.0–46.0)
Hemoglobin: 12.3 g/dL (ref 12.0–15.0)
Lymphs Abs: 1.8 10*3/uL (ref 0.7–4.0)
MCV: 88.5 fl (ref 78.0–100.0)
Monocytes Absolute: 0.4 10*3/uL (ref 0.1–1.0)
Monocytes Relative: 5.9 % (ref 3.0–12.0)
Neutro Abs: 4.3 10*3/uL (ref 1.4–7.7)
Platelets: 344 10*3/uL (ref 150.0–400.0)
RDW: 13.8 % (ref 11.5–14.6)
WBC: 6.5 10*3/uL (ref 4.5–10.5)

## 2012-03-05 LAB — HEPATIC FUNCTION PANEL
AST: 14 U/L (ref 0–37)
Alkaline Phosphatase: 85 U/L (ref 39–117)
Bilirubin, Direct: 0.1 mg/dL (ref 0.0–0.3)
Total Bilirubin: 0.3 mg/dL (ref 0.3–1.2)

## 2012-03-05 LAB — VITAMIN B12: Vitamin B-12: 718 pg/mL (ref 211–911)

## 2012-03-05 MED ORDER — HYDROCHLOROTHIAZIDE 25 MG PO TABS: 25.0000 mg | ORAL_TABLET | Freq: Every day | ORAL | Status: DC

## 2012-03-05 MED ORDER — METOPROLOL TARTRATE 50 MG PO TABS: 50.0000 mg | ORAL_TABLET | Freq: Two times a day (BID) | ORAL | Status: DC

## 2012-03-05 MED ORDER — AMLODIPINE BESYLATE 5 MG PO TABS: 5.0000 mg | ORAL_TABLET | Freq: Every day | ORAL | Status: DC

## 2012-03-05 MED ORDER — POTASSIUM CHLORIDE ER 10 MEQ PO TBCR: 10.0000 meq | EXTENDED_RELEASE_TABLET | Freq: Every day | ORAL | Status: DC

## 2012-03-05 MED ORDER — CYANOCOBALAMIN 1000 MCG/ML IJ SOLN
1000.0000 ug | Freq: Once | INTRAMUSCULAR | Status: AC
  Administered 2012-03-05: 1000 ug via INTRAMUSCULAR

## 2012-03-05 NOTE — Progress Notes (Signed)
  Subjective:    Patient ID: Kiara Ramirez, female    DOB: 08/02/1950, 61 y.o.   MRN: [id number]  HPI 61 yr old female for a cpx. 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 She is stiff and sore after she sits for any length of time.    Review of Systems  Constitutional: Negative.   HENT: Negative.   Eyes: Negative.   Respiratory: Negative.   Cardiovascular: Negative.   Gastrointestinal: Negative.   Genitourinary: Negative for dysuria, urgency, frequency, hematuria, flank pain, decreased urine volume, enuresis, difficulty urinating, pelvic pain and dyspareunia.  Musculoskeletal: Positive for arthralgias. Negative for myalgias, back pain, joint swelling and gait problem.  Skin: Negative.   Neurological: Negative.   Hematological: Negative.   Psychiatric/Behavioral: Negative.        Objective:   Physical Exam  Constitutional: She is oriented to person, place, and time. She appears well-developed and well-nourished. No distress.  HENT:  Head: Normocephalic and atraumatic.  Right Ear: External ear normal.  Left Ear: External ear normal.  Nose: Nose normal.  Mouth/Throat: Oropharynx is clear and moist. No oropharyngeal exudate.  Eyes: Conjunctivae normal and EOM are normal. Pupils are equal, round, and reactive to light. No scleral icterus.  Neck: Normal range of motion. Neck supple. No JVD present. No thyromegaly present.  Cardiovascular: Normal rate, regular rhythm, normal heart sounds and intact distal pulses.  Exam reveals no gallop and no friction rub.   No murmur heard.      EKG normal   Pulmonary/Chest: Effort normal and breath sounds normal. No respiratory distress. She has no wheezes. She has no rales. She exhibits no tenderness.  Abdominal: Soft. Bowel sounds are normal. She exhibits no distension and no mass. There is no tenderness. There is no rebound and no guarding.  Musculoskeletal: Normal range of motion. She exhibits no edema and no  tenderness.  Lymphadenopathy:    She has no cervical adenopathy.  Neurological: She is alert and oriented to person, place, and time. She has normal reflexes. No cranial nerve deficit. She exhibits normal muscle tone. Coordination normal.  Skin: Skin is warm and dry. No rash noted. No erythema.  Psychiatric: She has a normal mood and affect. Her behavior is normal. Judgment and thought content normal.          Assessment & Plan:  Well exam. Get fasting labs. Try Aleve bid for the joint pains.

## 2012-03-09 NOTE — Progress Notes (Signed)
Quick Note:  What about the B12 injections? ______

## 2012-03-10 NOTE — Progress Notes (Signed)
Quick Note:  I spoke with pt and put a copy of results in mail. ______ 

## 2012-03-16 ENCOUNTER — Other Ambulatory Visit: Payer: BC Managed Care – PPO

## 2012-03-23 ENCOUNTER — Encounter: Payer: BC Managed Care – PPO | Admitting: Family Medicine

## 2012-04-14 ENCOUNTER — Ambulatory Visit (INDEPENDENT_AMBULATORY_CARE_PROVIDER_SITE_OTHER): Payer: BC Managed Care – PPO | Admitting: Family Medicine

## 2012-04-14 DIAGNOSIS — E539 Vitamin B deficiency, unspecified: Secondary | ICD-10-CM

## 2012-04-14 DIAGNOSIS — Z23 Encounter for immunization: Secondary | ICD-10-CM

## 2012-04-14 MED ORDER — CYANOCOBALAMIN 1000 MCG/ML IJ SOLN
1000.0000 ug | Freq: Once | INTRAMUSCULAR | Status: AC
  Administered 2012-04-14: 1000 ug via INTRAMUSCULAR

## 2012-06-24 ENCOUNTER — Encounter: Payer: Self-pay | Admitting: Gastroenterology

## 2013-03-14 ENCOUNTER — Encounter (HOSPITAL_COMMUNITY): Payer: Self-pay | Admitting: Emergency Medicine

## 2013-03-14 ENCOUNTER — Emergency Department (HOSPITAL_COMMUNITY)
Admission: EM | Admit: 2013-03-14 | Discharge: 2013-03-14 | Disposition: A | Discharge status: Home / Self Care | Payer: BC Managed Care – PPO | Source: Home / Self Care

## 2013-03-14 DIAGNOSIS — B029 Zoster without complications: Secondary | ICD-10-CM

## 2013-03-14 MED ORDER — VALACYCLOVIR HCL 1 G PO TABS: 1000.0000 mg | ORAL_TABLET | Freq: Three times a day (TID) | ORAL | Status: AC

## 2013-03-14 MED ORDER — LIDOCAINE 5 % EX PTCH: 1.0000 | MEDICATED_PATCH | CUTANEOUS | Status: DC

## 2013-03-14 MED ORDER — METHYLPREDNISOLONE 4 MG PO KIT: PACK | ORAL | Status: DC

## 2013-03-14 MED ORDER — HYDROCODONE-ACETAMINOPHEN 5-325 MG PO TABS: 1.0000 | ORAL_TABLET | ORAL | Status: DC | PRN

## 2013-03-14 NOTE — ED Notes (Signed)
Pt. states she noticed a rash on right lower back on Thanksgiving.  Noted right lower back rash that  is red, has pus filled bumps.  The rash is located lower back and is around right flank area.  Area is painful, has burning sensation.  Denies itching.  Has not used any OTC medications for area.  States pain shoots through her lower back.

## 2013-03-14 NOTE — ED Provider Notes (Signed)
CSN: [id number]     Arrival date & time 03/14/13  3086 History   First MD Initiated Contact with Patient 03/14/13 1013     Chief Complaint  Patient presents with  . Herpes Zoster    Pt. states she noticed a rash on right lower back on Thanksgiving.  Noted right lower back rash that  is red, has pus filled bumps.  The rash is located lower back and is around right flank area.  Area is painful, has burning sensation   (Consider location/radiation/quality/duration/timing/severity/associated sxs/prior Treatment) HPI Comments: 62 y o F with gradual progression of pain in the low back an migrating across the right side of the back to the R anterior axillary line. There is a strip of papulovesicular lesions within a 6 cm band assoc with pain and tenderness. Burning pain Started on 03/11/13 Hx of varicela as a  child   Past Medical History  Diagnosis Date  . Hypertension   . Anxiety   . Headache(784.0)   . B12 deficiency    Past Surgical History  Procedure Laterality Date  . Tubal ligation    . Colonoscopy  07/13/07    per Dr. Russella Dar, repeat in 5 yrs    Family History  Problem Relation Age of Onset  . Cancer      colon/endometrial  . Hypertension      family hx  . Arthritis      family hx  . Diabetes      family hx   History  Substance Use Topics  . Smoking status: Never Smoker   . Smokeless tobacco: Never Used  . Alcohol Use: No   OB History   Grav Para Term Preterm Abortions TAB SAB Ect Mult Living                 Review of Systems  Constitutional: Negative.   Skin: Positive for rash. Negative for wound.  Neurological: Negative.   All other systems reviewed and are negative.    Allergies  Ace inhibitors  Home Medications   Current Outpatient Rx  Name  Route  Sig  Dispense  Refill  . amLODipine (NORVASC) 5 MG tablet   Oral   Take 1 tablet (5 mg total) by mouth daily.   90 tablet   3   . aspirin 81 MG tablet   Oral   Take 81 mg by mouth daily.           . cyanocobalamin (,VITAMIN B-12,) 1000 MCG/ML injection   Intramuscular   Inject 1,000 mcg into the muscle every 30 (thirty) days.           . hydrochlorothiazide (HYDRODIURIL) 25 MG tablet   Oral   Take 1 tablet (25 mg total) by mouth daily.   90 tablet   3   . metoprolol (LOPRESSOR) 50 MG tablet   Oral   Take 1 tablet (50 mg total) by mouth 2 (two) times daily.   180 tablet   3   . naproxen sodium (ANAPROX) 220 MG tablet   Oral   Take 220 mg by mouth 2 (two) times daily with a meal.         . potassium chloride (KLOR-CON 10) 10 MEQ tablet   Oral   Take 1 tablet (10 mEq total) by mouth daily.   90 tablet   3   . HYDROcodone-acetaminophen (NORCO/VICODIN) 5-325 MG per tablet   Oral   Take 1 tablet by mouth every 4 (four) hours as  needed.   15 tablet   0   . lidocaine (LIDODERM) 5 %   Transdermal   Place 1 patch onto the skin daily. Remove & Discard patch within 12 hours or as directed by MD   6 patch   0   . methylPREDNISolone (MEDROL DOSEPAK) 4 MG tablet      follow package directions   21 tablet   0   . valACYclovir (VALTREX) 1000 MG tablet   Oral   Take 1 tablet (1,000 mg total) by mouth 3 (three) times daily.   21 tablet   0    BP 125/81  Pulse 71  Temp(Src) 98.4 F (36.9 C) (Oral)  Resp 17  SpO2 95% Physical Exam  Nursing note and vitals reviewed. Constitutional: She is oriented to person, place, and time. She appears well-developed and well-nourished. No distress.  Eyes: Conjunctivae and EOM are normal.  Neck: Normal range of motion. Neck supple.  Cardiovascular: Normal rate.   Pulmonary/Chest: Effort normal and breath sounds normal. No respiratory distress.  Neurological: She is alert and oriented to person, place, and time.  Skin: Skin is warm and dry. Rash noted.  Papulovessicular rash on an erythematous base, tender. Follows a T11-L1 dermatome.   Psychiatric: She has a normal mood and affect.    ED Course  Procedures (including  critical care time) Labs Review Labs Reviewed - No data to display Imaging Review No results found.      MDM   1. Herpes zoster infection      lidoderm patches as directed Valtrex 1 gm tid x 7 d norco 5  #15 Medrol dose pack, take with food  Hayden Rasmussen, NP 03/14/13 1035

## 2013-03-15 ENCOUNTER — Telehealth: Payer: Self-pay | Admitting: Family Medicine

## 2013-03-15 NOTE — Telephone Encounter (Signed)
Per Dr. Clent Ridges, okay to have labs drawn and I did call pt and leave a voice message.

## 2013-03-15 NOTE — Telephone Encounter (Signed)
[facility]/ [facility]ing[facility]. 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 Pt states she is on 3 different meds and would like to know if she should still come for those?

## 2013-03-16 ENCOUNTER — Telehealth: Payer: Self-pay | Admitting: Family Medicine

## 2013-03-16 NOTE — Telephone Encounter (Signed)
Patient Information:  Caller Name: Kiara Ramirez  Phone: [phone]  Patient: Kiara Ramirez  Gender: Female  DOB: August 02, 1950  Age: 62 Years  PCP: Kiara Ramirez [facility])  Office Follow Up:  Does the office need to follow up with this patient?: No  Instructions For The Office: N/A  RN Note:  Patient states she was diagnosed with shingles 03/14/13 and started valcyclovir for this that day.  States has a "nagging headache" since taking the medication, and the patient information sheet says this can be a side effect.  Per headache protocol, emergent symptoms denied; advised appt within 24 hours.  Appt scheduled 03/17/13 0930 with Dr. Clent Ridges.  krs/can  Symptoms  Reason For Call & Symptoms: headache while taking valcyclovir  Reviewed Health History In EMR: Yes  Reviewed Medications In EMR: Yes  Reviewed Allergies In EMR: Yes  Reviewed Surgeries / Procedures: Yes  Date of Onset of Symptoms: 03/14/2013  Guideline(s) Used:  Headache  Disposition Per Guideline:   See Today or Tomorrow in Office  Reason For Disposition Reached:   Unexplained headache that is present > 24 hours  Advice Given:  N/A  Patient Will Follow Care Advice:  YES  Appointment Scheduled:  03/17/2013 09:30:00 Appointment Scheduled Provider:  Gershon Ramirez [facility])

## 2013-03-17 ENCOUNTER — Ambulatory Visit (INDEPENDENT_AMBULATORY_CARE_PROVIDER_SITE_OTHER): Payer: BC Managed Care – PPO | Admitting: Family Medicine

## 2013-03-17 ENCOUNTER — Other Ambulatory Visit: Payer: BC Managed Care – PPO

## 2013-03-17 ENCOUNTER — Encounter: Payer: Self-pay | Admitting: Family Medicine

## 2013-03-17 VITALS — BP 130/82 | Temp 98.5°F | Wt 207.0 lb

## 2013-03-17 DIAGNOSIS — B029 Zoster without complications: Secondary | ICD-10-CM

## 2013-03-17 DIAGNOSIS — Z0000 Encounter for general adult medical examination without abnormal findings: Secondary | ICD-10-CM

## 2013-03-17 LAB — LIPID PANEL
HDL: 36.5 mg/dL — ABNORMAL LOW (ref >39.00)
Total CHOL/HDL Ratio: 3
Triglycerides: 45 mg/dL (ref 0.0–149.0)
VLDL: 9 mg/dL (ref 0.0–40.0)

## 2013-03-17 LAB — HEPATIC FUNCTION PANEL
AST: 12 U/L (ref 0–37)
Albumin: 3.3 g/dL — ABNORMAL LOW (ref 3.5–5.2)
Alkaline Phosphatase: 72 U/L (ref 39–117)
Bilirubin, Direct: 0.1 mg/dL (ref 0.0–0.3)
Total Bilirubin: 0.4 mg/dL (ref 0.3–1.2)
Total Protein: 8.2 g/dL (ref 6.0–8.3)

## 2013-03-17 LAB — CBC WITH DIFFERENTIAL/PLATELET
Basophils Absolute: 0 10*3/uL (ref 0.0–0.1)
Basophils Relative: 0.2 % (ref 0.0–3.0)
Eosinophils Absolute: 0 10*3/uL (ref 0.0–0.7)
HCT: 40.7 % (ref 36.0–46.0)
Hemoglobin: 13.5 g/dL (ref 12.0–15.0)
Lymphs Abs: 1.7 10*3/uL (ref 0.7–4.0)
MCHC: 33 g/dL (ref 30.0–36.0)
MCV: 88.2 fl (ref 78.0–100.0)
Monocytes Absolute: 0.8 10*3/uL (ref 0.1–1.0)
Monocytes Relative: 9.9 % (ref 3.0–12.0)
Neutro Abs: 5.7 10*3/uL (ref 1.4–7.7)
Platelets: 349 10*3/uL (ref 150.0–400.0)
RDW: 14.2 % (ref 11.5–14.6)

## 2013-03-17 LAB — BASIC METABOLIC PANEL
CO2: 30 mEq/L (ref 19–32)
Calcium: 8.9 mg/dL (ref 8.4–10.5)
Creatinine, Ser: 1 mg/dL (ref 0.4–1.2)
GFR: 75.9 mL/min (ref >60.00)
Sodium: 137 mEq/L (ref 135–145)

## 2013-03-17 LAB — POCT URINALYSIS DIPSTICK
Glucose, UA: NEGATIVE
Nitrite, UA: NEGATIVE
pH, UA: 6

## 2013-03-17 NOTE — Progress Notes (Signed)
   Subjective:    Patient ID: Kiara Ramirez, female    DOB: [birth year], 62 y.o.   MRN: [id number]  HPI Here to follow up on shingles which was diagnosed at Urgent Care on 03-14-13. This involves the right flank. She is using Valtrex, a Medrol dose pack, and Lidoderm patches. She is feeling a little better.   Review of Systems  Constitutional: Negative.   Skin: Positive for rash.       Objective:   Physical Exam  Constitutional: She appears well-developed and well-nourished.  Skin:  There is a band of erythema with vesicles on the right lower flank          Assessment & Plan:  Shingles which is resolving as expected. Recheck prn.

## 2013-03-17 NOTE — ED Provider Notes (Signed)
Medical screening examination/treatment/procedure(s) were performed by resident physician or non-physician practitioner and as supervising physician I was immediately available for consultation/collaboration.   KINDL,JAMES DOUGLAS MD.   James D Kindl, MD 03/17/13 1905 

## 2013-03-19 MED ORDER — POTASSIUM CHLORIDE ER 10 MEQ PO TBCR: 10.0000 meq | EXTENDED_RELEASE_TABLET | Freq: Two times a day (BID) | ORAL | Status: DC

## 2013-03-19 NOTE — Addendum Note (Signed)
Addended by: Aniceto Boss A on: 03/19/2013 05:21 PM   Modules accepted: Orders

## 2013-03-24 ENCOUNTER — Encounter: Payer: Self-pay | Admitting: Family Medicine

## 2013-03-24 ENCOUNTER — Ambulatory Visit (INDEPENDENT_AMBULATORY_CARE_PROVIDER_SITE_OTHER): Payer: BC Managed Care – PPO | Admitting: Family Medicine

## 2013-03-24 VITALS — BP 124/76 | HR 73 | Temp 98.3°F | Ht 62.25 in | Wt 207.0 lb

## 2013-03-24 DIAGNOSIS — E538 Deficiency of other specified B group vitamins: Secondary | ICD-10-CM

## 2013-03-24 DIAGNOSIS — N950 Postmenopausal bleeding: Secondary | ICD-10-CM

## 2013-03-24 DIAGNOSIS — Z0000 Encounter for general adult medical examination without abnormal findings: Secondary | ICD-10-CM

## 2013-03-24 NOTE — Progress Notes (Signed)
Pre visit review using our clinic review tool, if applicable. No additional management support is needed unless otherwise documented below in the visit note. 

## 2013-03-24 NOTE — Progress Notes (Signed)
   Subjective:    Patient ID: Kiara Ramirez, female    DOB: 01/18/1951, 62 y.o.   MRN: [id number]  HPI 63 yr old female for a cpx. She feels well in general but has a few items to discuss. First she is alarmed because last week she started having vaginal bleeding after intercourse with her husband, and she has had bleeding ever since. Prior to that she had not had a menses for about 4 years. No pelvic pain or DC. Her last pelvic exam and Pap smear were normal in 2012. She gets a mammogram every year. She has not had a B12 level checked in several years. She was due for a colonoscopy this past spring but she never got around to setting this up.    Review of Systems  Constitutional: Negative.   HENT: Negative.   Eyes: Negative.   Respiratory: Negative.   Cardiovascular: Negative.   Gastrointestinal: Negative.   Genitourinary: Negative for dysuria, urgency, frequency, hematuria, flank pain, decreased urine volume, enuresis, difficulty urinating, pelvic pain and dyspareunia.  Musculoskeletal: Negative.   Skin: Negative.   Neurological: Negative.   Psychiatric/Behavioral: Negative.        Objective:   Physical Exam  Constitutional: She is oriented to person, place, and time. She appears well-developed and well-nourished. No distress.  HENT:  Head: Normocephalic and atraumatic.  Right Ear: External ear normal.  Left Ear: External ear normal.  Nose: Nose normal.  Mouth/Throat: Oropharynx is clear and moist. No oropharyngeal exudate.  Eyes: Conjunctivae and EOM are normal. Pupils are equal, round, and reactive to light. No scleral icterus.  Neck: Normal range of motion. Neck supple. No JVD present. No thyromegaly present.  Cardiovascular: Normal rate, regular rhythm, normal heart sounds and intact distal pulses.  Exam reveals no gallop and no friction rub.   No murmur heard. EKG normal   Pulmonary/Chest: Effort normal and breath sounds normal. No respiratory distress. She has no wheezes. She  has no rales. She exhibits no tenderness.  Abdominal: Soft. Bowel sounds are normal. She exhibits no distension and no mass. There is no tenderness. There is no rebound and no guarding.  Musculoskeletal: Normal range of motion. She exhibits no edema and no tenderness.  Lymphadenopathy:    She has no cervical adenopathy.  Neurological: She is alert and oriented to person, place, and time. She has normal reflexes. No cranial nerve deficit. She exhibits normal muscle tone. Coordination normal.  Skin: Skin is warm and dry. No rash noted. No erythema.  Psychiatric: She has a normal mood and affect. Her behavior is normal. Judgment and thought content normal.          Assessment & Plan:  Well exam. We will refer her to GYN to evaluate post menopausal bleeding. Get a B12 level today. I encouraged her to contact Dr. Ardell Isaacs office to set up a colonoscopy.

## 2013-03-25 ENCOUNTER — Other Ambulatory Visit: Payer: Self-pay | Admitting: Family Medicine

## 2013-03-26 ENCOUNTER — Other Ambulatory Visit: Payer: Self-pay

## 2013-03-26 MED ORDER — AMLODIPINE BESYLATE 5 MG PO TABS: 5.0000 mg | ORAL_TABLET | Freq: Every day | ORAL | Status: DC

## 2013-04-23 ENCOUNTER — Other Ambulatory Visit: Payer: Self-pay | Admitting: Obstetrics and Gynecology

## 2013-04-26 ENCOUNTER — Encounter: Payer: Self-pay | Admitting: Family Medicine

## 2013-06-15 ENCOUNTER — Emergency Department (HOSPITAL_COMMUNITY)
Admission: EM | Admit: 2013-06-15 | Discharge: 2013-06-15 | Disposition: A | Discharge status: Home / Self Care | Payer: Self-pay | Source: Home / Self Care

## 2013-06-15 ENCOUNTER — Other Ambulatory Visit: Payer: Self-pay | Admitting: Family Medicine

## 2013-06-15 ENCOUNTER — Telehealth: Payer: Self-pay | Admitting: Family Medicine

## 2013-06-15 MED ORDER — HYDROCHLOROTHIAZIDE 25 MG PO TABS: 25.0000 mg | ORAL_TABLET | Freq: Every day | ORAL | Status: DC

## 2013-06-15 NOTE — Telephone Encounter (Signed)
Pt is needing rx hydrochlorothiazide (HYDRODIURIL) 25 MG tablet, sent to [facility] on [address]. 90 day supply. Pt states she is out of meds.

## 2013-06-15 NOTE — Telephone Encounter (Signed)
I sent script e-scribe. 

## 2013-07-21 ENCOUNTER — Encounter: Payer: Self-pay | Admitting: Family

## 2013-07-21 ENCOUNTER — Ambulatory Visit (INDEPENDENT_AMBULATORY_CARE_PROVIDER_SITE_OTHER): Payer: BC Managed Care – PPO | Admitting: Family

## 2013-07-21 VITALS — BP 108/74 | HR 84 | Temp 98.3°F | Wt 205.0 lb

## 2013-07-21 DIAGNOSIS — J209 Acute bronchitis, unspecified: Secondary | ICD-10-CM

## 2013-07-21 DIAGNOSIS — J019 Acute sinusitis, unspecified: Secondary | ICD-10-CM

## 2013-07-21 MED ORDER — METHYLPREDNISOLONE 4 MG PO KIT: PACK | ORAL | Status: DC

## 2013-07-21 MED ORDER — AMOXICILLIN-POT CLAVULANATE 875-125 MG PO TABS: 1.0000 | ORAL_TABLET | Freq: Two times a day (BID) | ORAL | Status: DC

## 2013-07-21 NOTE — Patient Instructions (Addendum)

## 2013-07-21 NOTE — Progress Notes (Signed)
Pre visit review using our clinic review tool, if applicable. No additional management support is needed unless otherwise documented below in the visit note. 

## 2013-07-21 NOTE — Progress Notes (Signed)
Subjective:    Patient ID: Kiara Ramirez, female    DOB: 06-10-1950, 63 y.o.   MRN: [id number]  HPI  63 year old African American female, nonsmoker is in today with complaints of cough, congestion, wheezing x1 week and worsening. She keeps her grandson was recently treated for bacterial infection. Has not been taking any over-the-counter medication but has been drinking tea and honey.  Review of Systems  Constitutional: Negative.   HENT: Positive for congestion and sore throat.   Respiratory: Positive for cough, shortness of breath and wheezing.   Cardiovascular: Negative.   Musculoskeletal: Negative.   Skin: Negative.   Allergic/Immunologic: Negative.   Neurological: Negative.   Psychiatric/Behavioral: Negative.    Past Medical History  Diagnosis Date  . Hypertension   . Anxiety   . Headache(784.0)   . B12 deficiency     History   Social History  . Marital Status: Married    Spouse Name: N/A    Number of Children: N/A  . Years of Education: N/A   Occupational History  . Not on file.   Social History Main Topics  . Smoking status: Never Smoker   . Smokeless tobacco: Never Used  . Alcohol Use: No  . Drug Use: No  . Sexual Activity: Not on file   Other Topics Concern  . Not on file   Social History Narrative  . No narrative on file    Past Surgical History  Procedure Laterality Date  . Tubal ligation    . Colonoscopy  07/13/07    per Dr. Fuller Ramirez, repeat in 5 yrs     Family History  Problem Relation Age of Onset  . Cancer      colon/endometrial  . Hypertension      family hx  . Arthritis      family hx  . Diabetes      family hx    Allergies  Allergen Reactions  . Ace Inhibitors   . Hydrocodone-Acetaminophen Nausea Only    Current Outpatient Prescriptions on File Prior to Visit  Medication Sig Dispense Refill  . amLODipine (NORVASC) 5 MG tablet Take 1 tablet (5 mg total) by mouth daily.  90 tablet  3  . aspirin 81 MG tablet Take 81 mg by mouth  daily.        . cyanocobalamin (,VITAMIN B-12,) 1000 MCG/ML injection Inject 1,000 mcg into the muscle every 30 (thirty) days.        . hydrochlorothiazide (HYDRODIURIL) 25 MG tablet Take 1 tablet (25 mg total) by mouth daily.  90 tablet  3  . lidocaine (LIDODERM) 5 % Place 1 patch onto the skin daily. Remove & Discard patch within 12 hours or as directed by MD  6 patch  0  . metoprolol (LOPRESSOR) 50 MG tablet Take 1 tablet (50 mg total) by mouth 2 (two) times daily.  180 tablet  3  . naproxen sodium (ANAPROX) 220 MG tablet Take 220 mg by mouth 2 (two) times daily with a meal.      . potassium chloride (KLOR-CON 10) 10 MEQ tablet Take 1 tablet (10 mEq total) by mouth 2 (two) times daily.  180 tablet  3   No current facility-administered medications on file prior to visit.    BP 108/74  Pulse 84  Temp(Src) 98.3 F (36.8 C) (Oral)  Wt 205 lb (92.987 kg)  SpO2 98%chart    Objective:   Physical Exam  Constitutional: She is oriented to person, place, and time. She appears  well-developed and well-nourished.  HENT:  Right Ear: External ear normal.  Left Ear: External ear normal.  Nose: Nose normal.  Mouth/Throat: Oropharynx is clear and moist.  Neck: Normal range of motion. Neck supple.  Cardiovascular: Normal rate, regular rhythm and normal heart sounds.   Pulmonary/Chest: Effort normal. She has wheezes.  Musculoskeletal: Normal range of motion.  Neurological: She is alert and oriented to person, place, and time.  Skin: Skin is warm and dry.  Psychiatric: She has a normal mood and affect.          Assessment & Ramirez:  Kiara Ramirez was seen today for cough.  Diagnoses and associated orders for this visit:  Sinusitis, acute  Acute bronchitis  Other Orders - amoxicillin-clavulanate (AUGMENTIN) 875-125 MG per tablet; Take 1 tablet by mouth 2 (two) times daily. - Discontinue: methylPREDNISolone (MEDROL DOSEPAK) 4 MG tablet; follow package directions - methylPREDNISolone (MEDROL  DOSEPAK) 4 MG tablet; follow package directions   Call the office if symptoms worsen or persist. Recheck as needed.

## 2013-10-15 ENCOUNTER — Other Ambulatory Visit: Payer: Self-pay | Admitting: Family Medicine

## 2013-11-09 ENCOUNTER — Encounter: Payer: Self-pay | Admitting: Gastroenterology

## 2014-03-28 ENCOUNTER — Other Ambulatory Visit (INDEPENDENT_AMBULATORY_CARE_PROVIDER_SITE_OTHER): Payer: BC Managed Care – PPO

## 2014-03-28 DIAGNOSIS — Z0000 Encounter for general adult medical examination without abnormal findings: Secondary | ICD-10-CM

## 2014-03-28 LAB — COMPREHENSIVE METABOLIC PANEL
ALT: 8 U/L (ref 0–35)
AST: 13 U/L (ref 0–37)
Albumin: 3 g/dL — ABNORMAL LOW (ref 3.5–5.2)
Alkaline Phosphatase: 79 U/L (ref 39–117)
BUN: 14 mg/dL (ref 6–23)
CHLORIDE: 102 meq/L (ref 96–112)
CO2: 31 mEq/L (ref 19–32)
Calcium: 8.8 mg/dL (ref 8.4–10.5)
Creatinine, Ser: 0.8 mg/dL (ref 0.4–1.2)
GFR: 92.03 mL/min (ref >60.00)
Glucose, Bld: 83 mg/dL (ref 70–99)
Potassium: 3.1 mEq/L — ABNORMAL LOW (ref 3.5–5.1)
Sodium: 139 mEq/L (ref 135–145)
Total Bilirubin: 0.3 mg/dL (ref 0.2–1.2)
Total Protein: 7.4 g/dL (ref 6.0–8.3)

## 2014-03-28 LAB — CBC WITH DIFFERENTIAL/PLATELET
BASOS ABS: 0 10*3/uL (ref 0.0–0.1)
Basophils Relative: 0.5 % (ref 0.0–3.0)
Eosinophils Absolute: 0.1 10*3/uL (ref 0.0–0.7)
Eosinophils Relative: 1.6 % (ref 0.0–5.0)
HCT: 36.9 % (ref 36.0–46.0)
HEMOGLOBIN: 12 g/dL (ref 12.0–15.0)
Lymphocytes Relative: 24.8 % (ref 12.0–46.0)
Lymphs Abs: 1.7 10*3/uL (ref 0.7–4.0)
MCHC: 32.4 g/dL (ref 30.0–36.0)
MCV: 88.5 fl (ref 78.0–100.0)
MONOS PCT: 6.6 % (ref 3.0–12.0)
Monocytes Absolute: 0.4 10*3/uL (ref 0.1–1.0)
NEUTROS PCT: 66.5 % (ref 43.0–77.0)
Neutro Abs: 4.5 10*3/uL (ref 1.4–7.7)
PLATELETS: 331 10*3/uL (ref 150.0–400.0)
RBC: 4.17 Mil/uL (ref 3.87–5.11)
RDW: 14.6 % (ref 11.5–15.5)
WBC: 6.7 10*3/uL (ref 4.0–10.5)

## 2014-03-28 LAB — LIPID PANEL
CHOL/HDL RATIO: 4
Cholesterol: 120 mg/dL (ref 0–200)
HDL: 33.4 mg/dL — ABNORMAL LOW (ref >39.00)
LDL Cholesterol: 76 mg/dL (ref 0–99)
NONHDL: 86.6
Triglycerides: 54 mg/dL (ref 0.0–149.0)
VLDL: 10.8 mg/dL (ref 0.0–40.0)

## 2014-03-28 LAB — POCT URINALYSIS DIPSTICK
Bilirubin, UA: NEGATIVE
GLUCOSE UA: NEGATIVE
Ketones, UA: NEGATIVE
LEUKOCYTES UA: NEGATIVE
Nitrite, UA: NEGATIVE
PH UA: 5.5
Protein, UA: NEGATIVE
RBC UA: NEGATIVE
Spec Grav, UA: 1.02
UROBILINOGEN UA: 0.2

## 2014-03-28 LAB — TSH: TSH: 1.66 u[IU]/mL (ref 0.35–4.50)

## 2014-04-01 MED ORDER — POTASSIUM CHLORIDE ER 10 MEQ PO TBCR: 20.0000 meq | EXTENDED_RELEASE_TABLET | Freq: Two times a day (BID) | ORAL | Status: DC

## 2014-04-01 NOTE — Addendum Note (Signed)
Addended by: Aggie Hacker A on: 04/01/2014 01:27 PM   Modules accepted: Orders

## 2014-04-04 ENCOUNTER — Encounter: Payer: Self-pay | Admitting: Family Medicine

## 2014-04-04 ENCOUNTER — Ambulatory Visit (INDEPENDENT_AMBULATORY_CARE_PROVIDER_SITE_OTHER): Payer: BC Managed Care – PPO | Admitting: Family Medicine

## 2014-04-04 DIAGNOSIS — Z0000 Encounter for general adult medical examination without abnormal findings: Secondary | ICD-10-CM

## 2014-04-04 DIAGNOSIS — E538 Deficiency of other specified B group vitamins: Secondary | ICD-10-CM

## 2014-04-04 DIAGNOSIS — I10 Essential (primary) hypertension: Secondary | ICD-10-CM

## 2014-04-04 NOTE — Progress Notes (Signed)
   Subjective:    Patient ID: Kiara Ramirez, female    DOB: [birth year], 63 y.o.   MRN: [id number]  HPI 63 yr old female for a cpx. She feels well in general but she has been having some cramps at night in the calves. She was supposed to be taking potassium but she admits that she stopped taking this some months ago because she was "tired of taking pills". She is past due for a colonoscopy. She sees her GYN in January.    Review of Systems  Constitutional: Negative.   HENT: Negative.   Eyes: Negative.   Respiratory: Negative.   Cardiovascular: Negative.   Gastrointestinal: Negative.   Genitourinary: Negative for dysuria, urgency, frequency, hematuria, flank pain, decreased urine volume, enuresis, difficulty urinating, pelvic pain and dyspareunia.  Musculoskeletal: Negative.   Skin: Negative.   Neurological: Negative.   Psychiatric/Behavioral: Negative.        Objective:   Physical Exam  Constitutional: She is oriented to person, place, and time. She appears well-developed and well-nourished. No distress.  HENT:  Head: Normocephalic and atraumatic.  Right Ear: External ear normal.  Left Ear: External ear normal.  Nose: Nose normal.  Mouth/Throat: Oropharynx is clear and moist. No oropharyngeal exudate.  Eyes: Conjunctivae and EOM are normal. Pupils are equal, round, and reactive to light. No scleral icterus.  Neck: Normal range of motion. Neck supple. No JVD present. No thyromegaly present.  Cardiovascular: Normal rate, regular rhythm, normal heart sounds and intact distal pulses.  Exam reveals no gallop and no friction rub.   No murmur heard. EKG normal   Pulmonary/Chest: Effort normal and breath sounds normal. No respiratory distress. She has no wheezes. She has no rales. She exhibits no tenderness.  Abdominal: Soft. Bowel sounds are normal. She exhibits no distension and no mass. There is no tenderness. There is no rebound and no guarding.  Musculoskeletal: Normal range of motion.  She exhibits no edema or tenderness.  Lymphadenopathy:    She has no cervical adenopathy.  Neurological: She is alert and oriented to person, place, and time. She has normal reflexes. No cranial nerve deficit. She exhibits normal muscle tone. Coordination normal.  Skin: Skin is warm and dry. No rash noted. No erythema.  Psychiatric: She has a normal mood and affect. Her behavior is normal. Judgment and thought content normal.          Assessment & Plan:  Well exam. Check a B12 level today. I urged her to take her potassium meds and she agreed. We will recheck this in one month. Set up another colonoscopy.

## 2014-04-04 NOTE — Progress Notes (Signed)
Pre visit review using our clinic review tool, if applicable. No additional management support is needed unless otherwise documented below in the visit note. 

## 2014-04-05 LAB — VITAMIN B12: VITAMIN B 12: 240 pg/mL (ref 211–911)

## 2014-04-06 ENCOUNTER — Telehealth: Payer: Self-pay | Admitting: Family Medicine

## 2014-04-06 NOTE — Telephone Encounter (Signed)
emmi emailed °

## 2014-04-14 ENCOUNTER — Ambulatory Visit (INDEPENDENT_AMBULATORY_CARE_PROVIDER_SITE_OTHER): Payer: BC Managed Care – PPO | Admitting: Family Medicine

## 2014-04-14 DIAGNOSIS — E538 Deficiency of other specified B group vitamins: Secondary | ICD-10-CM

## 2014-04-14 MED ORDER — CYANOCOBALAMIN 1000 MCG/ML IJ SOLN
1000.0000 ug | Freq: Once | INTRAMUSCULAR | Status: AC
  Administered 2014-04-14: 1000 ug via INTRAMUSCULAR

## 2014-04-29 ENCOUNTER — Ambulatory Visit (INDEPENDENT_AMBULATORY_CARE_PROVIDER_SITE_OTHER): Payer: BC Managed Care – PPO | Admitting: Family Medicine

## 2014-04-29 DIAGNOSIS — E538 Deficiency of other specified B group vitamins: Secondary | ICD-10-CM

## 2014-04-29 MED ORDER — CYANOCOBALAMIN 1000 MCG/ML IJ SOLN
1000.0000 ug | Freq: Once | INTRAMUSCULAR | Status: AC
  Administered 2014-04-29: 1000 ug via INTRAMUSCULAR

## 2014-05-01 ENCOUNTER — Other Ambulatory Visit: Payer: Self-pay | Admitting: Family Medicine

## 2014-05-12 ENCOUNTER — Ambulatory Visit (INDEPENDENT_AMBULATORY_CARE_PROVIDER_SITE_OTHER): Payer: BC Managed Care – PPO | Admitting: Family Medicine

## 2014-05-12 ENCOUNTER — Other Ambulatory Visit: Payer: Self-pay | Admitting: Family Medicine

## 2014-05-12 DIAGNOSIS — E538 Deficiency of other specified B group vitamins: Secondary | ICD-10-CM

## 2014-05-12 MED ORDER — CYANOCOBALAMIN 1000 MCG/ML IJ SOLN
1000.0000 ug | Freq: Once | INTRAMUSCULAR | Status: AC
  Administered 2014-05-12: 1000 ug via INTRAMUSCULAR

## 2014-05-13 ENCOUNTER — Ambulatory Visit: Payer: BC Managed Care – PPO | Admitting: Family Medicine

## 2014-05-27 ENCOUNTER — Ambulatory Visit (INDEPENDENT_AMBULATORY_CARE_PROVIDER_SITE_OTHER): Payer: BC Managed Care – PPO | Admitting: Family Medicine

## 2014-05-27 DIAGNOSIS — E538 Deficiency of other specified B group vitamins: Secondary | ICD-10-CM

## 2014-05-27 MED ORDER — CYANOCOBALAMIN 1000 MCG/ML IJ SOLN
1000.0000 ug | Freq: Once | INTRAMUSCULAR | Status: AC
  Administered 2014-05-27: 1000 ug via INTRAMUSCULAR

## 2014-06-01 ENCOUNTER — Ambulatory Visit (INDEPENDENT_AMBULATORY_CARE_PROVIDER_SITE_OTHER): Payer: BC Managed Care – PPO | Admitting: Physician Assistant

## 2014-06-01 VITALS — BP 116/80 | HR 64 | Temp 97.7°F | Resp 20 | Ht 62.0 in | Wt 205.4 lb

## 2014-06-01 DIAGNOSIS — R531 Weakness: Secondary | ICD-10-CM

## 2014-06-01 DIAGNOSIS — R002 Palpitations: Secondary | ICD-10-CM

## 2014-06-01 LAB — POCT CBC
Granulocyte percent: 65 %G (ref 37–80)
HEMATOCRIT: 44.5 % (ref 37.7–47.9)
Hemoglobin: 13.5 g/dL (ref 12.2–16.2)
Lymph, poc: 2.4 (ref 0.6–3.4)
MCH: 28.1 pg (ref 27–31.2)
MCHC: 30.4 g/dL — AB (ref 31.8–35.4)
MCV: 92.2 fL (ref 80–97)
MID (cbc): 0.6 (ref 0–0.9)
MPV: 7.3 fL (ref 0–99.8)
PLATELET COUNT, POC: 382 10*3/uL (ref 142–424)
POC Granulocyte: 5.6 (ref 2–6.9)
POC LYMPH PERCENT: 27.8 %L (ref 10–50)
POC MID %: 7.2 %M (ref 0–12)
RBC: 4.82 M/uL (ref 4.04–5.48)
RDW, POC: 16.3 %
WBC: 8.6 10*3/uL (ref 4.6–10.2)

## 2014-06-01 LAB — POCT URINALYSIS DIPSTICK
BILIRUBIN UA: NEGATIVE
Glucose, UA: NEGATIVE
Ketones, UA: NEGATIVE
Nitrite, UA: NEGATIVE
Protein, UA: NEGATIVE
Spec Grav, UA: 1.02
Urobilinogen, UA: 0.2
pH, UA: 5

## 2014-06-01 LAB — POCT UA - MICROSCOPIC ONLY
CASTS, UR, LPF, POC: NEGATIVE
CRYSTALS, UR, HPF, POC: NEGATIVE
Yeast, UA: NEGATIVE

## 2014-06-01 LAB — GLUCOSE, POCT (MANUAL RESULT ENTRY): POC Glucose: 114 mg/dl — AB (ref 70–99)

## 2014-06-01 NOTE — Patient Instructions (Signed)
Your ekg was normal today. Your exam and vital signs were normal today. You are not anemic, your blood sugar was normal, you are not fighting an infection.  Please cut back on your metoprolol. Please take 25 mg twice daily. Please follow up with your pcp asap. If the weakness worsens in the meantime please return to clinic or go to the ED asap. We drew labs to check your electrolytes today and I will be in contact with you with those results.

## 2014-06-01 NOTE — Progress Notes (Signed)
Subjective:    Patient ID: Kiara Ramirez, female    DOB: October 31, 1950, 64 y.o.   MRN: [id number]  PCP: Laurey Morale, MD  Chief Complaint  Patient presents with  . Dizziness    has not felt well today.  feels like she is going to pass out.     Patient Active Problem List   Diagnosis Date Noted  . HYPERTROPHY OF TONSILS ALONE 03/07/2008  . ANXIETY 04/20/2007  . WEAKNESS 04/01/2007  . VITAMIN B12 DEFICIENCY 02/25/2007  . HYPERTENSION 01/23/2007  . HEADACHE 01/23/2007   Prior to Admission medications   Medication Sig Start Date End Date Taking? Authorizing Provider  amLODipine (NORVASC) 5 MG tablet TAKE ONE TABLET BY MOUTH ONCE DAILY 05/13/14  Yes Laurey Morale, MD  aspirin 81 MG tablet Take 81 mg by mouth daily.     Yes Historical Provider, MD  cyanocobalamin (,VITAMIN B-12,) 1000 MCG/ML injection Inject 1,000 mcg into the muscle every 14 (fourteen) days.    Yes Historical Provider, MD  hydrochlorothiazide (HYDRODIURIL) 25 MG tablet Take 1 tablet (25 mg total) by mouth daily. 06/15/13  Yes Laurey Morale, MD  metoprolol (LOPRESSOR) 50 MG tablet TAKE ONE TABLET BY MOUTH TWICE DAILY   Yes Laurey Morale, MD  potassium chloride (KLOR-CON 10) 10 MEQ tablet Take 2 tablets (20 mEq total) by mouth 2 (two) times daily. 04/01/14  Yes Laurey Morale, MD   Medications, allergies, past medical history, surgical history, family history, social history and problem list reviewed and updated.  HPI  24 yof with pmh htn, anxiety, hypokalemia presents with weakness today.  Sx started today soon after waking. Initially started with feeling jittery, anxious on way to work. Thought she would settle down but continued to feel this way. Went home after work and started bible study and still felt jittery. Also describes her feelings as "weak, limp" at that time. No particular extremity or region, just felt exhausted, weak overall. Has had occasional palps past few months. Hx hypokalemia most recent k 3.1 2 months  ago. Taking oral k.   Denies dizziness, vision changes, cp, sob, vertigo.   She has been making dietary changes for lent the past 1.5 wks. Eating no meat/dairy so feels that she is eating less than normal. Today she has eaten broccoli, carrots, fruit, nuts, and drank water. Thinks she is drinking good amnt water but states her urine has been dark recently. Denies dysuria, freq,urgency. States that she still spots and has since pre menopause. Currently spotting.   No hx dm2, cad. Has hx B12 deficiency, receives injxns, last check one month ago was normal.   Review of Systems See HPI.     Objective:   Physical Exam  Constitutional: She is oriented to person, place, and time. She appears well-developed and well-nourished.  Non-toxic appearance. She does not have a sickly appearance. She does not appear ill. No distress.  BP 116/80 mmHg  Pulse 64  Temp(Src) 97.7 F (36.5 C) (Oral)  Resp 20  Ht 5\' 2"  (1.575 m)  Wt 205 lb 6 oz (93.157 kg)  BMI 37.55 kg/m2  SpO2 99%   Eyes: Conjunctivae and EOM are normal. Pupils are equal, round, and reactive to light.  Neck: Trachea normal and normal range of motion. Carotid bruit is not present.  Cardiovascular: Normal rate, regular rhythm and normal heart sounds.  Exam reveals no gallop.   No murmur heard. Pulses:      Posterior tibial pulses are 2+ on  the right side, and 2+ on the left side.  Pulmonary/Chest: Effort normal and breath sounds normal. She has no decreased breath sounds. She has no wheezes. She has no rhonchi. She has no rales.  Abdominal: There is no CVA tenderness.  Lymphadenopathy:       Head (right side): No submental, no submandibular and no tonsillar adenopathy present.       Head (left side): No submental, no submandibular and no tonsillar adenopathy present.  Neurological: She is alert and oriented to person, place, and time. She has normal strength. No cranial nerve deficit or sensory deficit. She displays a negative Romberg  sign.  Normal heel to shin, rapid alternating movements.   Skin: Skin is warm and dry. No rash noted.  Psychiatric: She has a normal mood and affect. Her speech is normal and behavior is normal.   EKG read by Dr. Carlota Raspberry.  Findings: Sinus brady with 1st degree avb. Non specific t wave inversion in lead III but otherwise no specific findings.   Results for orders placed or performed in visit on 06/01/14  POCT CBC  Result Value Ref Range   WBC 8.6 4.6 - 10.2 K/uL   Lymph, poc 2.4 0.6 - 3.4   POC LYMPH PERCENT 27.8 10 - 50 %L   MID (cbc) 0.6 0 - 0.9   POC MID % 7.2 0 - 12 %M   POC Granulocyte 5.6 2 - 6.9   Granulocyte percent 65.0 37 - 80 %G   RBC 4.82 4.04 - 5.48 M/uL   Hemoglobin 13.5 12.2 - 16.2 g/dL   HCT, POC 44.5 37.7 - 47.9 %   MCV 92.2 80 - 97 fL   MCH, POC 28.1 27 - 31.2 pg   MCHC 30.4 (A) 31.8 - 35.4 g/dL   RDW, POC 16.3 %   Platelet Count, POC 382 142 - 424 K/uL   MPV 7.3 0 - 99.8 fL  POCT glucose (manual entry)  Result Value Ref Range   POC Glucose 114 (A) 70 - 99 mg/dl  POCT UA - Microscopic Only  Result Value Ref Range   WBC, Ur, HPF, POC 0-2    RBC, urine, microscopic 0-2    Bacteria, U Microscopic trace    Mucus, UA trace    Epithelial cells, urine per micros 1-3    Crystals, Ur, HPF, POC neg    Casts, Ur, LPF, POC neg    Yeast, UA neg   POCT urinalysis dipstick  Result Value Ref Range   Color, UA yellow    Clarity, UA clear    Glucose, UA neg    Bilirubin, UA neg    Ketones, UA neg    Spec Grav, UA 1.020    Blood, UA small    pH, UA 5.0    Protein, UA neg    Urobilinogen, UA 0.2    Nitrite, UA neg    Leukocytes, UA Trace       Assessment & Plan:   4 yof with pmh htn, anxiety, hypokalemia presents with weakness today.  Weakness - Plan: POCT CBC, POCT glucose (manual entry), EKG 77-SFSE, Basic metabolic panel, POCT UA - Microscopic Only, POCT urinalysis dipstick --doubt cva/tia with normal neuro exam, no unilateral weakness --doubt sepsis with  normal cbc, vitals --doubt cad with normal ekg, lack of cp --doubt palps with normal ekg today --doubt dehydration with normal ua today --could be slight hypoglycemia with pt not eating as much as normal recently due to lent, bg normal  today, encouraged good fat/protein intake --stay hydrated --could be slightly fatigued/weak due to beta blocker with bradycardia and 1st deg avb on ekg today --> decrease dose to 25 mg bid --f/u with pcp this week or early next week --er if worsens   Palpitations - Plan: EKG 09-TOIZ, Basic metabolic panel --normal ekg --awaiting bmp to check k as has been hypokalemic  Julieta Gutting, PA-C Physician Assistant-Certified [facility]  06/01/2014 9:38 PM

## 2014-06-02 LAB — BASIC METABOLIC PANEL
BUN: 13 mg/dL (ref 6–23)
CO2: 31 mEq/L (ref 19–32)
Calcium: 9.3 mg/dL (ref 8.4–10.5)
Chloride: 97 mEq/L (ref 96–112)
Creat: 0.9 mg/dL (ref 0.50–1.10)
Glucose, Bld: 87 mg/dL (ref 70–99)
Potassium: 3.9 mEq/L (ref 3.5–5.3)
SODIUM: 136 meq/L (ref 135–145)

## 2014-06-08 ENCOUNTER — Ambulatory Visit (INDEPENDENT_AMBULATORY_CARE_PROVIDER_SITE_OTHER): Payer: BC Managed Care – PPO | Admitting: Family Medicine

## 2014-06-08 ENCOUNTER — Encounter: Payer: Self-pay | Admitting: Family Medicine

## 2014-06-08 VITALS — BP 109/72 | HR 61 | Temp 98.8°F | Ht 62.0 in | Wt 205.0 lb

## 2014-06-08 DIAGNOSIS — R531 Weakness: Secondary | ICD-10-CM

## 2014-06-08 DIAGNOSIS — E538 Deficiency of other specified B group vitamins: Secondary | ICD-10-CM

## 2014-06-08 DIAGNOSIS — I10 Essential (primary) hypertension: Secondary | ICD-10-CM

## 2014-06-08 NOTE — Progress Notes (Signed)
   Subjective:    Patient ID: Kiara Ramirez, female    DOB: 30-Mar-1951, 64 y.o.   MRN: [id number]  HPI Here to follow up on a visit to Urgent Care on 06-01-14 for generalized weakness. Her labs were all normal and her exam was normal, although her heart rate and BP were a little low. She was advised to decrease her Metoprolol to 1/2 tablet (25 mg) bid and see [place]. Since then she has felt fine with no problems at all. Of note about 2 weeks prior to this she had dramatically changed her diet to reduce sugar and salt intake and she has stopped eating meat.    Review of Systems  Constitutional: Negative.   Respiratory: Negative.   Cardiovascular: Negative.   Neurological: Negative.        Objective:   Physical Exam  Constitutional: She appears well-developed and well-nourished.  Cardiovascular: Normal rate, regular rhythm, normal heart sounds and intact distal pulses.   Pulmonary/Chest: Effort normal and breath sounds normal.          Assessment & Plan:  She seems to be doing well but we will go ahead and stop her Amlodipine. Stay on metoprolol 25 mg bid and HCTZ. Recheck one month

## 2014-06-08 NOTE — Progress Notes (Signed)
Pre visit review using our clinic review tool, if applicable. No additional management support is needed unless otherwise documented below in the visit note. 

## 2014-06-17 ENCOUNTER — Ambulatory Visit (INDEPENDENT_AMBULATORY_CARE_PROVIDER_SITE_OTHER): Payer: BC Managed Care – PPO | Admitting: Family Medicine

## 2014-06-17 DIAGNOSIS — E538 Deficiency of other specified B group vitamins: Secondary | ICD-10-CM

## 2014-06-17 MED ORDER — CYANOCOBALAMIN 1000 MCG/ML IJ SOLN
1000.0000 ug | Freq: Once | INTRAMUSCULAR | Status: AC
  Administered 2014-06-17: 1000 ug via INTRAMUSCULAR

## 2014-07-01 ENCOUNTER — Ambulatory Visit (INDEPENDENT_AMBULATORY_CARE_PROVIDER_SITE_OTHER): Payer: BC Managed Care – PPO | Admitting: Family Medicine

## 2014-07-01 DIAGNOSIS — E538 Deficiency of other specified B group vitamins: Secondary | ICD-10-CM

## 2014-07-04 MED ORDER — CYANOCOBALAMIN 1000 MCG/ML IJ SOLN
1000.0000 ug | Freq: Once | INTRAMUSCULAR | Status: AC
  Administered 2014-07-01: 1000 ug via INTRAMUSCULAR

## 2014-07-11 ENCOUNTER — Other Ambulatory Visit (INDEPENDENT_AMBULATORY_CARE_PROVIDER_SITE_OTHER): Payer: BC Managed Care – PPO

## 2014-07-11 DIAGNOSIS — E538 Deficiency of other specified B group vitamins: Secondary | ICD-10-CM

## 2014-07-11 LAB — VITAMIN B12: VITAMIN B 12: 623 pg/mL (ref 211–911)

## 2014-07-14 ENCOUNTER — Other Ambulatory Visit: Payer: Self-pay | Admitting: Family Medicine

## 2014-07-14 ENCOUNTER — Ambulatory Visit: Payer: BC Managed Care – PPO | Admitting: Family Medicine

## 2014-07-14 ENCOUNTER — Ambulatory Visit (INDEPENDENT_AMBULATORY_CARE_PROVIDER_SITE_OTHER): Payer: BC Managed Care – PPO | Admitting: Family Medicine

## 2014-07-14 ENCOUNTER — Encounter: Payer: Self-pay | Admitting: Family Medicine

## 2014-07-14 VITALS — BP 122/82 | HR 70 | Temp 97.5°F | Ht 62.0 in | Wt 197.9 lb

## 2014-07-14 DIAGNOSIS — M25562 Pain in left knee: Secondary | ICD-10-CM | POA: Diagnosis not present

## 2014-07-14 NOTE — Progress Notes (Signed)
HPI:  L knee pain: -stood up and turned yesterday and injured L knee yesterday -reports hx of knee problems remotely -she thinks the L knee was swollen and she had sig pain in the L knee yesterday -reports iced this and took aleve and is improving today, but still hurts with certain movements today -denies: weakness, numbness, redness, rash, fever, falls, giving away, clicking or popping  ROS: See pertinent positives and negatives per HPI.  Past Medical History  Diagnosis Date  . Hypertension   . Anxiety   . Headache(784.0)   . B12 deficiency     Past Surgical History  Procedure Laterality Date  . Tubal ligation    . Colonoscopy  07/13/07    per Dr. Fuller Plan, repeat in 5 yrs     Family History  Problem Relation Age of Onset  . Cancer      colon/endometrial  . Hypertension      family hx  . Arthritis      family hx  . Diabetes      family hx  . Breast cancer Sister     History   Social History  . Marital Status: Married    Spouse Name: N/A  . Number of Children: N/A  . Years of Education: N/A   Social History Main Topics  . Smoking status: Never Smoker   . Smokeless tobacco: Never Used  . Alcohol Use: No  . Drug Use: No  . Sexual Activity: Not on file   Other Topics Concern  . None   Social History Narrative     Current outpatient prescriptions:  .  aspirin 81 MG tablet, Take 81 mg by mouth daily.  , Disp: , Rfl:  .  cyanocobalamin (,VITAMIN B-12,) 1000 MCG/ML injection, Inject 1,000 mcg into the muscle every 14 (fourteen) days. , Disp: , Rfl:  .  hydrochlorothiazide (HYDRODIURIL) 25 MG tablet, Take 1 tablet (25 mg total) by mouth daily., Disp: 90 tablet, Rfl: 3 .  metoprolol (LOPRESSOR) 50 MG tablet, TAKE ONE TABLET BY MOUTH TWICE DAILY, Disp: 180 tablet, Rfl: 1 .  potassium chloride (KLOR-CON 10) 10 MEQ tablet, Take 2 tablets (20 mEq total) by mouth 2 (two) times daily., Disp: 360 tablet, Rfl: 3  EXAM:  Filed Vitals:   07/14/14 1308  BP: 122/82   Pulse: 70  Temp: 97.5 F (36.4 C)    Body mass index is 36.19 kg/(m^2).  GENERAL: vitals reviewed and listed above, alert, oriented, appears well hydrated and in no acute distress  HEENT: atraumatic, conjunttiva clear, no obvious abnormalities on inspection of external nose and ears  NECK: no obvious masses on inspection  MS: moves all extremities without noticeable abnormality -no swelling on inspection of either knee -TTP along the medial collateral lig of the L knee and some medial jt line TTP -normal sensation to liht touch and strength in L LE with good pedal pulses -no patellar crepitus, neg j sign, neg lachman and drawer tests, neg mcmurry, some pain with val stress  PSYCH: pleasant and cooperative, no obvious depression or anxiety  ASSESSMENT AND PLAN:  Discussed the following assessment and plan:  Knee pain, acute, left  -we discussed possible serious and likely etiologies - suspect strain and possible underlying OA , workup and treatment, treatment risks and return precautions -after this discussion, Kiara Ramirez opted for HEP, weight loss, tylenol -follow up advised in 1 month - plain films and potentially steroid inj if OA and not improving -of course, we advised Kiara Ramirez  to return or notify a doctor immediately if symptoms worsen or persist or new concerns arise.  -Patient advised to return or notify a doctor immediately if symptoms worsen or persist or new concerns arise.  Patient Instructions  BEFORE YOU LEAVE: -knee strain exercises -follow up appointment in 1 month  Do the exercises 4 days per week and when feeling better start to gradually add regular cardiovascular exercise to your daily routine - water aerobics, stationary cycling or walking would be good  Tylenol 500-1000mg  up to 3 times daily is ok if needed for pain     KIM, HANNAH R.

## 2014-07-14 NOTE — Patient Instructions (Signed)
BEFORE YOU LEAVE: -knee strain exercises -follow up appointment in 1 month  Do the exercises 4 days per week and when feeling better start to gradually add regular cardiovascular exercise to your daily routine - water aerobics, stationary cycling or walking would be good  Tylenol 500-1000mg  up to 3 times daily is ok if needed for pain

## 2014-07-14 NOTE — Progress Notes (Signed)
Pre visit review using our clinic review tool, if applicable. No additional management support is needed unless otherwise documented below in the visit note. 

## 2014-07-19 ENCOUNTER — Other Ambulatory Visit: Payer: Self-pay | Admitting: Family Medicine

## 2014-07-21 ENCOUNTER — Telehealth: Payer: Self-pay | Admitting: *Deleted

## 2014-07-21 NOTE — Telephone Encounter (Signed)
Dr.Fry, pt wants to know is she suppose to continue B12 shots every 2 weeks or go back to every month? Lab was in normal range. Please advise.

## 2014-07-22 NOTE — Telephone Encounter (Signed)
Lets go to getting shots every 4 weeks and recheck a level in 90 days

## 2014-07-22 NOTE — Telephone Encounter (Signed)
Spoke to pt, told her Dr. Sarajane Jews said go to getting shots every 4 weeks and recheck level in 3 months. Pt verbalized understanding.

## 2014-10-27 ENCOUNTER — Other Ambulatory Visit: Payer: Self-pay | Admitting: Family Medicine

## 2014-10-28 NOTE — Telephone Encounter (Signed)
Patient called about the below re-fill request. She is completely out.

## 2014-11-09 ENCOUNTER — Ambulatory Visit (INDEPENDENT_AMBULATORY_CARE_PROVIDER_SITE_OTHER): Payer: BC Managed Care – PPO

## 2014-11-09 ENCOUNTER — Ambulatory Visit (INDEPENDENT_AMBULATORY_CARE_PROVIDER_SITE_OTHER): Payer: BC Managed Care – PPO | Admitting: Emergency Medicine

## 2014-11-09 VITALS — BP 132/86 | HR 66 | Temp 98.2°F | Resp 16 | Ht 63.0 in | Wt 194.8 lb

## 2014-11-09 DIAGNOSIS — M10072 Idiopathic gout, left ankle and foot: Secondary | ICD-10-CM

## 2014-11-09 DIAGNOSIS — M25572 Pain in left ankle and joints of left foot: Secondary | ICD-10-CM | POA: Diagnosis not present

## 2014-11-09 DIAGNOSIS — M109 Gout, unspecified: Secondary | ICD-10-CM

## 2014-11-09 MED ORDER — INDOMETHACIN 25 MG PO CAPS: 25.0000 mg | ORAL_CAPSULE | Freq: Three times a day (TID) | ORAL | Status: DC

## 2014-11-09 MED ORDER — COLCHICINE 0.6 MG PO TABS: ORAL_TABLET | ORAL | Status: DC

## 2014-11-09 NOTE — Patient Instructions (Signed)

## 2014-11-09 NOTE — Progress Notes (Signed)
Subjective:  Patient ID: Kiara Ramirez, female    DOB: 1950-06-16  Age: 64 y.o. MRN: [id number]  CC: Foot Swelling   HPI Kiara Ramirez presents   With sudden onset of pain in her left foot in the second third metatarsal phalangeal joints started this morning. She said she first noticed pain and swelling and redness in the dorsum of foot. She has no history of injury or overuse. She has no history of gout.  Or other arthritis had no improvement with over-the-counter medication  History Kiara Ramirez has a past medical history of Hypertension; Anxiety; Headache(784.0); and B12 deficiency.   She has past surgical history that includes Tubal ligation and Colonoscopy (07/13/07).   Her  family history includes Arthritis in an other family member; Breast cancer in her sister; Cancer in an other family member; Diabetes in an other family member; Hypertension in an other family member.  She   reports that she has never smoked. She has never used smokeless tobacco. She reports that she does not drink alcohol or use illicit drugs.  Outpatient Prescriptions Prior to Visit  Medication Sig Dispense Refill  . aspirin 81 MG tablet Take 81 mg by mouth daily.      . cyanocobalamin (,VITAMIN B-12,) 1000 MCG/ML injection Inject 1,000 mcg into the muscle every 14 (fourteen) days.     . hydrochlorothiazide (HYDRODIURIL) 25 MG tablet TAKE ONE TABLET BY MOUTH ONCE DAILY 90 tablet 0  . metoprolol (LOPRESSOR) 50 MG tablet TAKE ONE TABLET BY MOUTH TWICE DAILY 180 tablet 0  . potassium chloride (KLOR-CON 10) 10 MEQ tablet Take 2 tablets (20 mEq total) by mouth 2 (two) times daily. 360 tablet 3   No facility-administered medications prior to visit.    History   Social History  . Marital Status: Married    Spouse Name: N/A  . Number of Children: N/A  . Years of Education: N/A   Social History Main Topics  . Smoking status: Never Smoker   . Smokeless tobacco: Never Used  . Alcohol Use: No  . Drug Use: No  .  Sexual Activity: Not on file   Other Topics Concern  . None   Social History Narrative     Review of Systems  Constitutional: Negative for fever, chills and appetite change.  HENT: Negative for congestion, ear pain, postnasal drip, sinus pressure and sore throat.   Eyes: Negative for pain and redness.  Respiratory: Negative for cough, shortness of breath and wheezing.   Cardiovascular: Negative for leg swelling.  Gastrointestinal: Negative for nausea, vomiting, abdominal pain, diarrhea, constipation and blood in stool.  Endocrine: Negative for polyuria.  Genitourinary: Negative for dysuria, urgency, frequency and flank pain.  Musculoskeletal: Negative for gait problem.  Skin: Negative for rash.  Neurological: Negative for weakness and headaches.  Psychiatric/Behavioral: Negative for confusion and decreased concentration. The patient is not nervous/anxious.     Objective:  BP 132/86 mmHg  Pulse 66  Temp(Src) 98.2 F (36.8 C) (Oral)  Resp 16  Ht 5\' 3"  (1.6 m)  Wt 194 lb 12.8 oz (88.361 kg)  BMI 34.52 kg/m2  SpO2 96%  Physical Exam  Constitutional: She is oriented to person, place, and time. She appears well-developed and well-nourished.  HENT:  Head: Normocephalic and atraumatic.  Eyes: Conjunctivae are normal. Pupils are equal, round, and reactive to light.  Pulmonary/Chest: Effort normal.  Musculoskeletal: She exhibits no edema.       Left foot: There is tenderness and swelling.  Neurological: She is  alert and oriented to person, place, and time.  Skin: Skin is dry.  Psychiatric: She has a normal mood and affect. Her behavior is normal. Thought content normal.      Assessment & Plan:   Kiara Ramirez was seen today for foot swelling.  Diagnoses and all orders for this visit:  Pain in joint, ankle and foot, left Orders: -     DG Foot Complete Left; Future  Acute gout of left foot, unspecified cause  Other orders -     colchicine 0.6 MG tablet; 2 at once then 1 in  one hour.  Tomorrow one daily -     indomethacin (INDOCIN) 25 MG capsule; Take 1 capsule (25 mg total) by mouth 3 (three) times daily with meals.   I am having Kiara Ramirez start on colchicine and indomethacin. I am also having her maintain her aspirin, cyanocobalamin, potassium chloride, metoprolol, and hydrochlorothiazide.  Meds ordered this encounter  Medications  . colchicine 0.6 MG tablet    Sig: 2 at once then 1 in one hour.  Tomorrow one daily    Dispense:  20 tablet    Refill:  0  . indomethacin (INDOCIN) 25 MG capsule    Sig: Take 1 capsule (25 mg total) by mouth 3 (three) times daily with meals.    Dispense:  45 capsule    Refill:  0    Appropriate red flag conditions were discussed with the patient as well as actions that should be taken.  Patient expressed his understanding.  Follow-up: Return if symptoms worsen or fail to improve.  Roselee Culver, MD   [facility] reading (PRIMARY) by  Dr. Ouida Sills.  negative.

## 2014-12-09 ENCOUNTER — Ambulatory Visit (INDEPENDENT_AMBULATORY_CARE_PROVIDER_SITE_OTHER): Payer: BC Managed Care – PPO | Admitting: Family Medicine

## 2014-12-09 DIAGNOSIS — E538 Deficiency of other specified B group vitamins: Secondary | ICD-10-CM | POA: Diagnosis not present

## 2014-12-09 MED ORDER — CYANOCOBALAMIN 1000 MCG/ML IJ SOLN
1000.0000 ug | Freq: Once | INTRAMUSCULAR | Status: AC
  Administered 2014-12-09: 1000 ug via INTRAMUSCULAR

## 2014-12-15 ENCOUNTER — Encounter: Payer: Self-pay | Admitting: Family Medicine

## 2014-12-15 ENCOUNTER — Ambulatory Visit (INDEPENDENT_AMBULATORY_CARE_PROVIDER_SITE_OTHER): Payer: BC Managed Care – PPO | Admitting: Family Medicine

## 2014-12-15 VITALS — BP 100/66 | HR 87 | Temp 98.6°F | Ht 63.0 in | Wt 189.0 lb

## 2014-12-15 DIAGNOSIS — R1110 Vomiting, unspecified: Secondary | ICD-10-CM

## 2014-12-15 LAB — CBC WITH DIFFERENTIAL/PLATELET
BASOS ABS: 0 10*3/uL (ref 0.0–0.1)
BASOS PCT: 0.3 % (ref 0.0–3.0)
Eosinophils Absolute: 0 10*3/uL (ref 0.0–0.7)
Eosinophils Relative: 0.1 % (ref 0.0–5.0)
HEMATOCRIT: 39.4 % (ref 36.0–46.0)
Hemoglobin: 13 g/dL (ref 12.0–15.0)
LYMPHS PCT: 53.8 % — AB (ref 12.0–46.0)
Lymphs Abs: 4.3 10*3/uL — ABNORMAL HIGH (ref 0.7–4.0)
MCHC: 33 g/dL (ref 30.0–36.0)
MCV: 86.8 fl (ref 78.0–100.0)
MONOS PCT: 6.6 % (ref 3.0–12.0)
Monocytes Absolute: 0.5 10*3/uL (ref 0.1–1.0)
NEUTROS ABS: 3.2 10*3/uL (ref 1.4–7.7)
Neutrophils Relative %: 39.2 % — ABNORMAL LOW (ref 43.0–77.0)
Platelets: 335 10*3/uL (ref 150.0–400.0)
RBC: 4.54 Mil/uL (ref 3.87–5.11)
RDW: 14.3 % (ref 11.5–15.5)
WBC: 8.1 10*3/uL (ref 4.0–10.5)

## 2014-12-15 LAB — BASIC METABOLIC PANEL
BUN: 16 mg/dL (ref 6–23)
CO2: 32 mEq/L (ref 19–32)
Calcium: 8.6 mg/dL (ref 8.4–10.5)
Chloride: 99 mEq/L (ref 96–112)
Creatinine, Ser: 0.99 mg/dL (ref 0.40–1.20)
GFR: 72.84 mL/min (ref >60.00)
Glucose, Bld: 86 mg/dL (ref 70–99)
Potassium: 3.3 mEq/L — ABNORMAL LOW (ref 3.5–5.1)
SODIUM: 138 meq/L (ref 135–145)

## 2014-12-15 LAB — HEPATIC FUNCTION PANEL
ALK PHOS: 73 U/L (ref 39–117)
ALT: 23 U/L (ref 0–35)
AST: 30 U/L (ref 0–37)
Albumin: 3.1 g/dL — ABNORMAL LOW (ref 3.5–5.2)
BILIRUBIN DIRECT: 0.1 mg/dL (ref 0.0–0.3)
Total Bilirubin: 0.3 mg/dL (ref 0.2–1.2)
Total Protein: 7.2 g/dL (ref 6.0–8.3)

## 2014-12-15 LAB — POCT URINALYSIS DIPSTICK
Blood, UA: NEGATIVE
GLUCOSE UA: NEGATIVE
KETONES UA: NEGATIVE
LEUKOCYTES UA: NEGATIVE
Nitrite, UA: NEGATIVE
PROTEIN UA: 15
Spec Grav, UA: >=1.03
Urobilinogen, UA: 0.2
pH, UA: 5.5

## 2014-12-15 LAB — VITAMIN B12: Vitamin B-12: 1359 pg/mL — ABNORMAL HIGH (ref 211–911)

## 2014-12-15 LAB — TSH: TSH: 0.83 u[IU]/mL (ref 0.35–4.50)

## 2014-12-15 MED ORDER — PROMETHAZINE HCL 50 MG/ML IJ SOLN
50.0000 mg | Freq: Once | INTRAMUSCULAR | Status: AC
  Administered 2014-12-15: 50 mg via INTRAMUSCULAR

## 2014-12-15 MED ORDER — ONDANSETRON HCL 8 MG PO TABS: 8.0000 mg | ORAL_TABLET | Freq: Three times a day (TID) | ORAL | Status: DC | PRN

## 2014-12-15 NOTE — Addendum Note (Signed)
Addended by: Colleen Can on: 12/15/2014 12:37 PM   Modules accepted: Orders

## 2014-12-15 NOTE — Progress Notes (Signed)
Pre visit review using our clinic review tool, if applicable. No additional management support is needed unless otherwise documented below in the visit note. 

## 2014-12-15 NOTE — Addendum Note (Signed)
Addended by: Aggie Hacker A on: 12/15/2014 12:48 PM   Modules accepted: Orders

## 2014-12-15 NOTE — Progress Notes (Signed)
   Subjective:    Patient ID: Kiara Ramirez, female    DOB: 02-Oct-1950, 64 y.o.   MRN: [id number]  HPI Here for 2 days of weakness, nausea and occasional dry heaving. No fever. No URI sx, no ST and no cough. No change in BMs. Urinations are normal. No abdominal pain.    Review of Systems  Constitutional: Positive for fatigue. Negative for fever, chills and diaphoresis.  HENT: Negative.   Eyes: Negative.   Respiratory: Negative.   Cardiovascular: Negative.   Gastrointestinal: Positive for nausea and vomiting. Negative for abdominal pain, diarrhea, constipation, blood in stool, abdominal distention, anal bleeding and rectal pain.  Endocrine: Negative.   Genitourinary: Negative.   Neurological: Negative.        Objective:   Physical Exam  Constitutional: She is oriented to person, place, and time. She appears well-developed and well-nourished.  Ill appearing but alert  HENT:  Right Ear: External ear normal.  Left Ear: External ear normal.  Nose: Nose normal.  Mouth/Throat: Oropharynx is clear and moist.  Eyes: Conjunctivae are normal.  Neck: No thyromegaly present.  Cardiovascular: Normal rate, regular rhythm, normal heart sounds and intact distal pulses.   Pulmonary/Chest: Effort normal and breath sounds normal. No respiratory distress. She has no wheezes. She has no rales.  Abdominal: Soft. Bowel sounds are normal. She exhibits no distension and no mass. There is no tenderness. There is no rebound and no guarding.  Lymphadenopathy:    She has no cervical adenopathy.  Neurological: She is alert and oriented to person, place, and time.          Assessment & Plan:  Nausea and vomiting with weakness, possible viral illness. We will send her for labs today. Given a shot of Phenergan and a rx for Zofran to quiet the nausea. Drink plenty of fluids.

## 2014-12-17 LAB — URINE CULTURE: Colony Count: 70000

## 2014-12-20 ENCOUNTER — Telehealth: Payer: Self-pay | Admitting: Family Medicine

## 2014-12-20 NOTE — Addendum Note (Signed)
Addended by: Aggie Hacker A on: 12/20/2014 01:23 PM   Modules accepted: Medications

## 2014-12-20 NOTE — Telephone Encounter (Signed)
I spoke with pt and went over results. 

## 2014-12-20 NOTE — Telephone Encounter (Signed)
Pt would like results of labs. Please call back  Done last thurs.Marland Kitchen

## 2015-02-17 LAB — HM MAMMOGRAPHY

## 2015-02-21 ENCOUNTER — Encounter: Payer: Self-pay | Admitting: Family Medicine

## 2015-02-24 ENCOUNTER — Other Ambulatory Visit: Payer: Self-pay | Admitting: Family Medicine

## 2015-04-28 ENCOUNTER — Ambulatory Visit
Admission: RE | Admit: 2015-04-28 | Discharge: 2015-04-28 | Disposition: A | Discharge status: Home / Self Care | Payer: BC Managed Care – PPO | Source: Ambulatory Visit | Attending: Physician Assistant | Admitting: Physician Assistant

## 2015-04-28 ENCOUNTER — Ambulatory Visit (INDEPENDENT_AMBULATORY_CARE_PROVIDER_SITE_OTHER): Payer: BC Managed Care – PPO | Admitting: Physician Assistant

## 2015-04-28 VITALS — BP 118/72 | HR 64 | Temp 98.5°F | Resp 18 | Ht 63.0 in | Wt 189.0 lb

## 2015-04-28 DIAGNOSIS — R209 Unspecified disturbances of skin sensation: Secondary | ICD-10-CM | POA: Diagnosis not present

## 2015-04-28 DIAGNOSIS — R202 Paresthesia of skin: Secondary | ICD-10-CM

## 2015-04-28 NOTE — Patient Instructions (Signed)
YOU ARE TO GO OVER TO [facility] NOW AS A WALK-IN FOR YOUR CT SCAN.  CJ:[id number]

## 2015-04-28 NOTE — Progress Notes (Signed)
04/28/2015 1:05 PM   DOB: 1951/01/10 / MRN: [id number]  SUBJECTIVE:  Kiara Ramirez is a 65 y.o. female presenting for for the evaluation of right ear fullness that started 1 days ago.  Associated symptoms include {no other symtpoms today and she denies runny nose, congestion, fever, cough, sore throat, difficulty breathing, headache and jaw pain. Treatments tried thus far include nothing with poor relief. She denies sick contacts.  She complains of left facial paresthesia along the T2 distribution.  Feels that it is as if she went to the dentist and she was given numbing medications.  Associates left arm paresthesia, states that she feels as if her arm is "asleep."  Denies weakness, difficulty with speech, confusion.  Denies a history of stroke.  Takes ASA daily.    Lab review reveals a mild dyslipidemia.  She is a controlled hypertensive.  No family history of stroke.  She has never smoked.    She is allergic to ace inhibitors and hydrocodone-acetaminophen.   She  has a past medical history of Hypertension; Anxiety; Headache(784.0); and B12 deficiency.    She  reports that she has never smoked. She has never used smokeless tobacco. She reports that she does not drink alcohol or use illicit drugs. She  has no sexual activity history on file. The patient  has past surgical history that includes Tubal ligation and Colonoscopy (07/13/07).  Her family history includes Breast cancer in her sister.  ROS  Per HPI.   Problem list and medications reviewed and updated by myself where necessary, and exist elsewhere in the encounter.   OBJECTIVE:  BP 118/72 mmHg  Pulse 64  Temp(Src) 98.5 F (36.9 C) (Oral)  Resp 18  Ht 5\' 3"  (1.6 m)  Wt 189 lb (85.73 kg)  BMI 33.49 kg/m2  SpO2 96%  Physical Exam  Constitutional: She is oriented to person, place, and time. She appears well-developed and well-nourished. No distress.  HENT:  Right Ear: Tympanic membrane normal. Tympanic membrane is not injected.  No middle ear effusion.  Left Ear: Tympanic membrane normal. Tympanic membrane is not injected.  No middle ear effusion.  Cardiovascular: Normal rate and regular rhythm.   Pulmonary/Chest: Effort normal and breath sounds normal. She has no wheezes.  Abdominal: Soft.  Musculoskeletal: Normal range of motion.  Neurological: She is alert and oriented to person, place, and time. She has normal reflexes. She displays normal reflexes. A cranial nerve deficit (Mildly decrease sensation along the left T2 distribution) is present. She exhibits normal muscle tone. Coordination and gait normal.  Gait, FTN, Heel to [place], Vibratory and position sense intact to challenge.   Skin: Skin is warm and dry. She is not diaphoretic.    No results found for this or any previous visit (from the past 48 hour(s)).  ASSESSMENT AND PLAN:  Havan was seen today for ear fullness and fatigue.  Diagnoses and all orders for this visit:  Facial paresthesia: I am concerned that she may be having a stroke. Most recent B-12 elevated.  She has few risk factors for this however she is elderly and the cost of missing a neurological problem could be catastrophic to her health.  Will stat CT her today and call her with the results.   -     Cancel: CT Head W Contrast; Future -     Cancel: CT Head Wo Contrast; Future -     CT Head Wo Contrast; Future   The patient was advised to call or return  to clinic if she does not see an improvement in symptoms or to seek the care of the closest emergency department if she worsens with the above plan.   Philis Fendt, MHS, PA-C [facility] 04/28/2015 1:05 PM   Addendum: Head CT negative.  Advised that patient follow up with her PCP for this problem.

## 2015-04-30 NOTE — Progress Notes (Signed)
  Medical screening examination/treatment/procedure(s) were performed by non-physician practitioner and as supervising physician I was immediately available for consultation/collaboration.     

## 2015-06-13 ENCOUNTER — Other Ambulatory Visit: Payer: Self-pay | Admitting: Family Medicine

## 2015-06-21 ENCOUNTER — Other Ambulatory Visit (INDEPENDENT_AMBULATORY_CARE_PROVIDER_SITE_OTHER): Payer: BC Managed Care – PPO

## 2015-06-21 DIAGNOSIS — Z0000 Encounter for general adult medical examination without abnormal findings: Secondary | ICD-10-CM | POA: Diagnosis not present

## 2015-06-21 LAB — CBC WITH DIFFERENTIAL/PLATELET
Basophils Absolute: 0 10*3/uL (ref 0.0–0.1)
Basophils Relative: 0.5 % (ref 0.0–3.0)
EOS ABS: 0.1 10*3/uL (ref 0.0–0.7)
EOS PCT: 1.1 % (ref 0.0–5.0)
HCT: 36.2 % (ref 36.0–46.0)
HEMOGLOBIN: 12.2 g/dL (ref 12.0–15.0)
LYMPHS ABS: 2.4 10*3/uL (ref 0.7–4.0)
Lymphocytes Relative: 44.9 % (ref 12.0–46.0)
MCHC: 33.8 g/dL (ref 30.0–36.0)
MCV: 88.8 fl (ref 78.0–100.0)
MONO ABS: 0.4 10*3/uL (ref 0.1–1.0)
Monocytes Relative: 7.3 % (ref 3.0–12.0)
NEUTROS PCT: 46.2 % (ref 43.0–77.0)
Neutro Abs: 2.5 10*3/uL (ref 1.4–7.7)
Platelets: 300 10*3/uL (ref 150.0–400.0)
RBC: 4.08 Mil/uL (ref 3.87–5.11)
RDW: 14 % (ref 11.5–15.5)
WBC: 5.4 10*3/uL (ref 4.0–10.5)

## 2015-06-21 LAB — BASIC METABOLIC PANEL
BUN: 11 mg/dL (ref 6–23)
CALCIUM: 9.1 mg/dL (ref 8.4–10.5)
CO2: 32 mEq/L (ref 19–32)
CREATININE: 0.79 mg/dL (ref 0.40–1.20)
Chloride: 100 mEq/L (ref 96–112)
GFR: 94.35 mL/min (ref >60.00)
GLUCOSE: 84 mg/dL (ref 70–99)
POTASSIUM: 3.9 meq/L (ref 3.5–5.1)
Sodium: 139 mEq/L (ref 135–145)

## 2015-06-21 LAB — HEPATIC FUNCTION PANEL
ALT: 8 U/L (ref 0–35)
AST: 12 U/L (ref 0–37)
Albumin: 3.4 g/dL — ABNORMAL LOW (ref 3.5–5.2)
Alkaline Phosphatase: 76 U/L (ref 39–117)
BILIRUBIN DIRECT: 0.1 mg/dL (ref 0.0–0.3)
BILIRUBIN TOTAL: 0.5 mg/dL (ref 0.2–1.2)
Total Protein: 7 g/dL (ref 6.0–8.3)

## 2015-06-21 LAB — POC URINALSYSI DIPSTICK (AUTOMATED)
Bilirubin, UA: NEGATIVE
Blood, UA: NEGATIVE
Glucose, UA: NEGATIVE
Ketones, UA: NEGATIVE
LEUKOCYTES UA: NEGATIVE
Nitrite, UA: NEGATIVE
PH UA: 8.5
Spec Grav, UA: 1.015
Urobilinogen, UA: 1

## 2015-06-21 LAB — LIPID PANEL
CHOL/HDL RATIO: 3
CHOLESTEROL: 122 mg/dL (ref 0–200)
HDL: 35.6 mg/dL — ABNORMAL LOW (ref >39.00)
LDL Cholesterol: 72 mg/dL (ref 0–99)
NonHDL: 86.77
TRIGLYCERIDES: 75 mg/dL (ref 0.0–149.0)
VLDL: 15 mg/dL (ref 0.0–40.0)

## 2015-06-21 LAB — TSH: TSH: 0.76 u[IU]/mL (ref 0.35–4.50)

## 2015-06-28 ENCOUNTER — Encounter: Payer: Self-pay | Admitting: Family Medicine

## 2015-06-28 ENCOUNTER — Ambulatory Visit (INDEPENDENT_AMBULATORY_CARE_PROVIDER_SITE_OTHER): Payer: BC Managed Care – PPO | Admitting: Family Medicine

## 2015-06-28 VITALS — BP 101/65 | HR 62 | Temp 97.9°F | Ht 63.0 in | Wt 192.0 lb

## 2015-06-28 DIAGNOSIS — Z0000 Encounter for general adult medical examination without abnormal findings: Secondary | ICD-10-CM

## 2015-06-28 DIAGNOSIS — E538 Deficiency of other specified B group vitamins: Secondary | ICD-10-CM | POA: Diagnosis not present

## 2015-06-28 MED ORDER — CYANOCOBALAMIN 1000 MCG/ML IJ SOLN
1000.0000 ug | Freq: Once | INTRAMUSCULAR | Status: AC
  Administered 2015-06-28: 1000 ug via INTRAMUSCULAR

## 2015-06-28 MED ORDER — POTASSIUM CHLORIDE ER 10 MEQ PO TBCR: 20.0000 meq | EXTENDED_RELEASE_TABLET | Freq: Two times a day (BID) | ORAL | Status: DC

## 2015-06-28 MED ORDER — METOPROLOL TARTRATE 50 MG PO TABS: 50.0000 mg | ORAL_TABLET | Freq: Two times a day (BID) | ORAL | Status: DC

## 2015-06-28 MED ORDER — HYDROCHLOROTHIAZIDE 25 MG PO TABS: 25.0000 mg | ORAL_TABLET | Freq: Every day | ORAL | Status: DC

## 2015-06-28 NOTE — Progress Notes (Signed)
Pre visit review using our clinic review tool, if applicable. No additional management support is needed unless otherwise documented below in the visit note. 

## 2015-06-28 NOTE — Addendum Note (Signed)
Addended by: Aggie Hacker A on: 06/28/2015 04:10 PM   Modules accepted: Orders

## 2015-06-28 NOTE — Progress Notes (Signed)
   Subjective:    Patient ID: Kiara Ramirez, female    DOB: 1950/12/02, 65 y.o.   MRN: [id number]  HPI 65 yr old female for a cpx. She feels well. She saw Urgent Care in January for an episode of tingling in the left face that has since resolved. Her workup then included a normal head CT scan.    Review of Systems  Constitutional: Negative.   HENT: Negative.   Eyes: Negative.   Respiratory: Negative.   Cardiovascular: Negative.   Gastrointestinal: Negative.   Genitourinary: Negative for dysuria, urgency, frequency, hematuria, flank pain, decreased urine volume, enuresis, difficulty urinating, pelvic pain and dyspareunia.  Musculoskeletal: Negative.   Skin: Negative.   Neurological: Negative.   Psychiatric/Behavioral: Negative.        Objective:   Physical Exam  Constitutional: She is oriented to person, place, and time. She appears well-developed and well-nourished. No distress.  HENT:  Head: Normocephalic and atraumatic.  Right Ear: External ear normal.  Left Ear: External ear normal.  Nose: Nose normal.  Mouth/Throat: Oropharynx is clear and moist. No oropharyngeal exudate.  Eyes: Conjunctivae and EOM are normal. Pupils are equal, round, and reactive to light. No scleral icterus.  Neck: Normal range of motion. Neck supple. No JVD present. No thyromegaly present.  Cardiovascular: Normal rate, regular rhythm, normal heart sounds and intact distal pulses.  Exam reveals no gallop and no friction rub.   No murmur heard. EKG normal with first degree AVB   Pulmonary/Chest: Effort normal and breath sounds normal. No respiratory distress. She has no wheezes. She has no rales. She exhibits no tenderness.  Abdominal: Soft. Bowel sounds are normal. She exhibits no distension and no mass. There is no tenderness. There is no rebound and no guarding.  Musculoskeletal: Normal range of motion. She exhibits no edema or tenderness.  Lymphadenopathy:    She has no cervical adenopathy.    Neurological: She is alert and oriented to person, place, and time. She has normal reflexes. No cranial nerve deficit. She exhibits normal muscle tone. Coordination normal.  Skin: Skin is warm and dry. No rash noted. No erythema.  Psychiatric: She has a normal mood and affect. Her behavior is normal. Judgment and thought content normal.          Assessment & Plan:  Well exam. We discussed diet and exercise. She needs to get back on monthly B12 shots. She is past due for a colonoscopy so she was directed to follow up with the GI office.

## 2015-07-24 ENCOUNTER — Ambulatory Visit: Payer: BC Managed Care – PPO | Admitting: *Deleted

## 2015-07-27 ENCOUNTER — Ambulatory Visit (INDEPENDENT_AMBULATORY_CARE_PROVIDER_SITE_OTHER): Payer: BC Managed Care – PPO | Admitting: *Deleted

## 2015-07-27 DIAGNOSIS — E538 Deficiency of other specified B group vitamins: Secondary | ICD-10-CM

## 2015-07-27 MED ORDER — CYANOCOBALAMIN 1000 MCG/ML IJ SOLN
1000.0000 ug | Freq: Once | INTRAMUSCULAR | Status: AC
  Administered 2015-07-27: 1000 ug via INTRAMUSCULAR

## 2016-02-12 ENCOUNTER — Other Ambulatory Visit: Payer: Self-pay | Admitting: Obstetrics and Gynecology

## 2016-02-16 ENCOUNTER — Ambulatory Visit (INDEPENDENT_AMBULATORY_CARE_PROVIDER_SITE_OTHER): Payer: BC Managed Care – PPO

## 2016-02-16 DIAGNOSIS — E538 Deficiency of other specified B group vitamins: Secondary | ICD-10-CM

## 2016-02-16 DIAGNOSIS — Z23 Encounter for immunization: Secondary | ICD-10-CM

## 2016-02-16 MED ORDER — CYANOCOBALAMIN 1000 MCG/ML IJ SOLN
1000.0000 ug | Freq: Once | INTRAMUSCULAR | Status: AC
  Administered 2016-02-16: 1000 ug via INTRAMUSCULAR

## 2016-02-16 MED ORDER — CYANOCOBALAMIN 1000 MCG/ML IJ SOLN
1000.0000 ug | Freq: Once | INTRAMUSCULAR | Status: DC
Start: 2016-02-16 — End: 2016-02-16

## 2016-04-12 ENCOUNTER — Ambulatory Visit (INDEPENDENT_AMBULATORY_CARE_PROVIDER_SITE_OTHER): Payer: BC Managed Care – PPO

## 2016-04-12 DIAGNOSIS — E539 Vitamin B deficiency, unspecified: Secondary | ICD-10-CM | POA: Diagnosis not present

## 2016-04-12 DIAGNOSIS — E538 Deficiency of other specified B group vitamins: Secondary | ICD-10-CM

## 2016-04-12 MED ORDER — CYANOCOBALAMIN 1000 MCG/ML IJ SOLN
1000.0000 ug | Freq: Once | INTRAMUSCULAR | Status: AC
  Administered 2016-04-12: 1000 ug via INTRAMUSCULAR

## 2016-04-12 NOTE — Progress Notes (Signed)
Patient was given a B12 injection for her Vit B deficiency which was given on 04/12/16 at 11am by Rosealee Albee CMA

## 2016-05-28 ENCOUNTER — Ambulatory Visit (INDEPENDENT_AMBULATORY_CARE_PROVIDER_SITE_OTHER): Payer: BC Managed Care – PPO | Admitting: Family Medicine

## 2016-05-28 ENCOUNTER — Encounter: Payer: Self-pay | Admitting: Family Medicine

## 2016-05-28 VITALS — BP 128/78 | HR 67 | Temp 98.1°F | Ht 63.0 in | Wt 211.0 lb

## 2016-05-28 DIAGNOSIS — I10 Essential (primary) hypertension: Secondary | ICD-10-CM

## 2016-05-28 DIAGNOSIS — E538 Deficiency of other specified B group vitamins: Secondary | ICD-10-CM | POA: Diagnosis not present

## 2016-05-28 DIAGNOSIS — R531 Weakness: Secondary | ICD-10-CM

## 2016-05-28 MED ORDER — CYANOCOBALAMIN 1000 MCG/ML IJ SOLN
1000.0000 ug | Freq: Once | INTRAMUSCULAR | Status: AC
  Administered 2016-05-28: 1000 ug via INTRAMUSCULAR

## 2016-05-28 NOTE — Progress Notes (Signed)
Pre visit review using our clinic review tool, if applicable. No additional management support is needed unless otherwise documented below in the visit note. 

## 2016-05-28 NOTE — Progress Notes (Signed)
   Subjective:    Patient ID: Kiara Ramirez, female    DOB: [birth year], 66 y.o.   MRN: [id number]  HPI Here for several episodes of lightheadedness or weakness over the past month. She has another one today. She felt fine this morning but around lunch time she began to feel very fatigued all over. She denies any SOB or chest pain or palpitations. No headaches or vertigo. Of note she has been getting B12 shots for some time and we have been cutting back on her dosing. She is now getting these once a month. The last time we checked a serum level was Sept. 2016.   Review of Systems  Constitutional: Positive for fatigue. Negative for chills and fever.  HENT: Negative.   Respiratory: Negative.   Cardiovascular: Negative.   Gastrointestinal: Negative.   Genitourinary: Negative.   Neurological: Positive for light-headedness. Negative for dizziness, tremors, seizures, syncope, facial asymmetry, speech difficulty, weakness, numbness and headaches.       Objective:   Physical Exam  Constitutional: She is oriented to person, place, and time. She appears well-developed and well-nourished. No distress.  Eyes: Conjunctivae and EOM are normal. Pupils are equal, round, and reactive to light.  Neck: No thyromegaly present.  Cardiovascular: Normal rate, regular rhythm, normal heart sounds and intact distal pulses.   Pulmonary/Chest: Effort normal and breath sounds normal. No respiratory distress. She has no wheezes. She has no rales.  Abdominal: Soft. Bowel sounds are normal. She exhibits no distension and no mass. There is no tenderness. There is no rebound and no guarding.  Musculoskeletal: She exhibits no edema.  Lymphadenopathy:    She has no cervical adenopathy.  Neurological: She is alert and oriented to person, place, and time.          Assessment & Plan:  She is having periods of fatigue or weakness and I suspect her B12 level has been falling. She has a cpx wit labs coming up soon and I will  add a B12 level to them.  Alysia Penna, MD

## 2016-06-12 ENCOUNTER — Encounter: Payer: BC Managed Care – PPO | Admitting: Family Medicine

## 2016-06-24 ENCOUNTER — Other Ambulatory Visit (INDEPENDENT_AMBULATORY_CARE_PROVIDER_SITE_OTHER): Payer: BC Managed Care – PPO

## 2016-06-24 DIAGNOSIS — E538 Deficiency of other specified B group vitamins: Secondary | ICD-10-CM

## 2016-06-24 DIAGNOSIS — Z0000 Encounter for general adult medical examination without abnormal findings: Secondary | ICD-10-CM | POA: Diagnosis not present

## 2016-06-24 LAB — HEPATIC FUNCTION PANEL
ALT: 8 U/L (ref 0–35)
AST: 12 U/L (ref 0–37)
Albumin: 3.4 g/dL — ABNORMAL LOW (ref 3.5–5.2)
Alkaline Phosphatase: 73 U/L (ref 39–117)
BILIRUBIN DIRECT: 0.1 mg/dL (ref 0.0–0.3)
TOTAL PROTEIN: 7 g/dL (ref 6.0–8.3)
Total Bilirubin: 0.3 mg/dL (ref 0.2–1.2)

## 2016-06-24 LAB — LIPID PANEL
CHOL/HDL RATIO: 4
CHOLESTEROL: 118 mg/dL (ref 0–200)
HDL: 31 mg/dL — ABNORMAL LOW (ref >39.00)
LDL Cholesterol: 72 mg/dL (ref 0–99)
NonHDL: 87.33
Triglycerides: 77 mg/dL (ref 0.0–149.0)
VLDL: 15.4 mg/dL (ref 0.0–40.0)

## 2016-06-24 LAB — CBC WITH DIFFERENTIAL/PLATELET
BASOS PCT: 0.2 % (ref 0.0–3.0)
Basophils Absolute: 0 10*3/uL (ref 0.0–0.1)
EOS PCT: 2.1 % (ref 0.0–5.0)
Eosinophils Absolute: 0.1 10*3/uL (ref 0.0–0.7)
HEMATOCRIT: 37.5 % (ref 36.0–46.0)
HEMOGLOBIN: 12.3 g/dL (ref 12.0–15.0)
LYMPHS PCT: 43.9 % (ref 12.0–46.0)
Lymphs Abs: 2.6 10*3/uL (ref 0.7–4.0)
MCHC: 32.8 g/dL (ref 30.0–36.0)
MCV: 90.2 fl (ref 78.0–100.0)
MONOS PCT: 6.8 % (ref 3.0–12.0)
Monocytes Absolute: 0.4 10*3/uL (ref 0.1–1.0)
Neutro Abs: 2.8 10*3/uL (ref 1.4–7.7)
Neutrophils Relative %: 47 % (ref 43.0–77.0)
Platelets: 309 10*3/uL (ref 150.0–400.0)
RBC: 4.15 Mil/uL (ref 3.87–5.11)
RDW: 13.7 % (ref 11.5–15.5)
WBC: 6 10*3/uL (ref 4.0–10.5)

## 2016-06-24 LAB — POC URINALSYSI DIPSTICK (AUTOMATED)
Bilirubin, UA: NEGATIVE
Glucose, UA: NEGATIVE
Ketones, UA: NEGATIVE
LEUKOCYTES UA: NEGATIVE
NITRITE UA: NEGATIVE
PROTEIN UA: NEGATIVE
SPEC GRAV UA: 1.02
UROBILINOGEN UA: 0.2
pH, UA: 5

## 2016-06-24 LAB — BASIC METABOLIC PANEL
BUN: 12 mg/dL (ref 6–23)
CO2: 33 mEq/L — ABNORMAL HIGH (ref 19–32)
Calcium: 9.1 mg/dL (ref 8.4–10.5)
Chloride: 102 mEq/L (ref 96–112)
Creatinine, Ser: 0.83 mg/dL (ref 0.40–1.20)
GFR: 88.84 mL/min (ref >60.00)
Glucose, Bld: 88 mg/dL (ref 70–99)
Potassium: 3.8 mEq/L (ref 3.5–5.1)
Sodium: 140 mEq/L (ref 135–145)

## 2016-06-25 LAB — VITAMIN B12: VITAMIN B 12: 477 pg/mL (ref 211–911)

## 2016-06-25 LAB — TSH: TSH: 1.08 u[IU]/mL (ref 0.35–4.50)

## 2016-06-26 ENCOUNTER — Other Ambulatory Visit: Payer: BC Managed Care – PPO

## 2016-07-01 ENCOUNTER — Encounter: Payer: Self-pay | Admitting: Family Medicine

## 2016-07-01 ENCOUNTER — Ambulatory Visit (INDEPENDENT_AMBULATORY_CARE_PROVIDER_SITE_OTHER): Payer: BC Managed Care – PPO | Admitting: Family Medicine

## 2016-07-01 VITALS — BP 131/88 | HR 60 | Temp 98.2°F | Ht 63.0 in | Wt 208.0 lb

## 2016-07-01 DIAGNOSIS — E538 Deficiency of other specified B group vitamins: Secondary | ICD-10-CM | POA: Diagnosis not present

## 2016-07-01 DIAGNOSIS — Z0000 Encounter for general adult medical examination without abnormal findings: Secondary | ICD-10-CM | POA: Diagnosis not present

## 2016-07-01 MED ORDER — HYDROCHLOROTHIAZIDE 25 MG PO TABS: 25.0000 mg | ORAL_TABLET | Freq: Every day | ORAL | 3 refills | Status: DC

## 2016-07-01 MED ORDER — CYANOCOBALAMIN 1000 MCG/ML IJ SOLN
1000.0000 ug | Freq: Once | INTRAMUSCULAR | Status: AC
  Administered 2016-07-01: 1000 ug via INTRAMUSCULAR

## 2016-07-01 MED ORDER — METOPROLOL TARTRATE 50 MG PO TABS: 50.0000 mg | ORAL_TABLET | Freq: Every day | ORAL | 3 refills | Status: DC

## 2016-07-01 MED ORDER — POTASSIUM CHLORIDE ER 10 MEQ PO TBCR: 20.0000 meq | EXTENDED_RELEASE_TABLET | Freq: Two times a day (BID) | ORAL | 3 refills | Status: DC

## 2016-07-01 NOTE — Addendum Note (Signed)
Addended by: Aggie Hacker A on: 07/01/2016 10:46 AM   Modules accepted: Orders

## 2016-07-01 NOTE — Progress Notes (Signed)
Pre visit review using our clinic review tool, if applicable. No additional management support is needed unless otherwise documented below in the visit note. 

## 2016-07-01 NOTE — Progress Notes (Signed)
   Subjective:    Patient ID: Kiara Ramirez, female    DOB: 1950-11-18, 66 y.o.   MRN: [id number]  HPI 66 yr old female for a well exam. She feels well.    Review of Systems  Constitutional: Negative.   HENT: Negative.   Eyes: Negative.   Respiratory: Negative.   Cardiovascular: Negative.   Gastrointestinal: Negative.   Genitourinary: Negative for decreased urine volume, difficulty urinating, dyspareunia, dysuria, enuresis, flank pain, frequency, hematuria, pelvic pain and urgency.  Musculoskeletal: Negative.   Skin: Negative.   Neurological: Negative.   Psychiatric/Behavioral: Negative.        Objective:   Physical Exam  Constitutional: She is oriented to person, place, and time. She appears well-developed and well-nourished. No distress.  HENT:  Head: Normocephalic and atraumatic.  Right Ear: External ear normal.  Left Ear: External ear normal.  Nose: Nose normal.  Mouth/Throat: Oropharynx is clear and moist. No oropharyngeal exudate.  Eyes: Conjunctivae and EOM are normal. Pupils are equal, round, and reactive to light. No scleral icterus.  Neck: Normal range of motion. Neck supple. No JVD present. No thyromegaly present.  Cardiovascular: Normal rate, regular rhythm, normal heart sounds and intact distal pulses.  Exam reveals no gallop and no friction rub.   No murmur heard. Pulmonary/Chest: Effort normal and breath sounds normal. No respiratory distress. She has no wheezes. She has no rales. She exhibits no tenderness.  Abdominal: Soft. Bowel sounds are normal. She exhibits no distension and no mass. There is no tenderness. There is no rebound and no guarding.  Musculoskeletal: Normal range of motion. She exhibits no edema or tenderness.  Lymphadenopathy:    She has no cervical adenopathy.  Neurological: She is alert and oriented to person, place, and time. She has normal reflexes. No cranial nerve deficit. She exhibits normal muscle tone. Coordination normal.  Skin: Skin is  warm and dry. No rash noted. No erythema.  Psychiatric: She has a normal mood and affect. Her behavior is normal. Judgment and thought content normal.          Assessment & Plan:  Well exam. We discussed diet and exercise. I reminded her that she is overdue for a colonoscopy and she agreed to set this up soon. Alysia Penna, MD

## 2016-07-19 ENCOUNTER — Telehealth: Payer: Self-pay | Admitting: Family Medicine

## 2016-07-19 NOTE — Telephone Encounter (Signed)
Pt need the following Rx hydrochlorothiazide #5,metoprolol #5 and potassium chloride #5 due to her leaving it at home in [place] she is traveling to [place] and would like to see if someone can send it to [place], [place]  [phone] since Dr. Sarajane Jews will not be back until Monday.  Pt would like to have a call back if they are or are not able to assist with this matter.

## 2016-07-19 NOTE — Telephone Encounter (Signed)
Spoke to patient and advised her to contact her pharmacy [facility] in [place] and have them transfer her prescriptions to the [facility] in [place] so she can pick up the medications when she gets there since Dr Sarajane Jews is not in the office today.

## 2016-08-30 ENCOUNTER — Ambulatory Visit (INDEPENDENT_AMBULATORY_CARE_PROVIDER_SITE_OTHER): Payer: BC Managed Care – PPO | Admitting: *Deleted

## 2016-08-30 DIAGNOSIS — E538 Deficiency of other specified B group vitamins: Secondary | ICD-10-CM | POA: Diagnosis not present

## 2016-08-30 MED ORDER — CYANOCOBALAMIN 1000 MCG/ML IJ SOLN
1000.0000 ug | Freq: Once | INTRAMUSCULAR | Status: AC
  Administered 2016-08-30: 1000 ug via INTRAMUSCULAR

## 2016-08-30 NOTE — Progress Notes (Signed)
Patient here for B12 injection; injection given to rt deltoid IM; patient tolerated well; no s/s of reactions.

## 2016-09-05 ENCOUNTER — Encounter (HOSPITAL_COMMUNITY): Payer: Self-pay | Admitting: *Deleted

## 2016-09-05 ENCOUNTER — Emergency Department (HOSPITAL_COMMUNITY)
Admission: EM | Admit: 2016-09-05 | Discharge: 2016-09-05 | Disposition: A | Discharge status: Home / Self Care | Payer: BC Managed Care – PPO | Attending: Emergency Medicine | Admitting: Emergency Medicine

## 2016-09-05 ENCOUNTER — Emergency Department (HOSPITAL_COMMUNITY): Payer: BC Managed Care – PPO

## 2016-09-05 ENCOUNTER — Ambulatory Visit (INDEPENDENT_AMBULATORY_CARE_PROVIDER_SITE_OTHER): Payer: BC Managed Care – PPO | Admitting: Physician Assistant

## 2016-09-05 VITALS — BP 152/84 | HR 82 | Resp 16

## 2016-09-05 DIAGNOSIS — R202 Paresthesia of skin: Secondary | ICD-10-CM | POA: Diagnosis not present

## 2016-09-05 DIAGNOSIS — R200 Anesthesia of skin: Secondary | ICD-10-CM | POA: Diagnosis present

## 2016-09-05 DIAGNOSIS — Z79899 Other long term (current) drug therapy: Secondary | ICD-10-CM | POA: Diagnosis not present

## 2016-09-05 DIAGNOSIS — Z7982 Long term (current) use of aspirin: Secondary | ICD-10-CM | POA: Insufficient documentation

## 2016-09-05 DIAGNOSIS — I10 Essential (primary) hypertension: Secondary | ICD-10-CM | POA: Diagnosis not present

## 2016-09-05 LAB — COMPREHENSIVE METABOLIC PANEL
ALK PHOS: 81 U/L (ref 38–126)
ALT: 11 U/L — ABNORMAL LOW (ref 14–54)
AST: 15 U/L (ref 15–41)
Albumin: 3.5 g/dL (ref 3.5–5.0)
Anion gap: 7 (ref 5–15)
BUN: 17 mg/dL (ref 6–20)
CALCIUM: 9.1 mg/dL (ref 8.9–10.3)
CHLORIDE: 104 mmol/L (ref 101–111)
CO2: 30 mmol/L (ref 22–32)
Creatinine, Ser: 0.89 mg/dL (ref 0.44–1.00)
GFR calc non Af Amer: >60 mL/min (ref >60)
GLUCOSE: 93 mg/dL (ref 65–99)
Potassium: 3.5 mmol/L (ref 3.5–5.1)
SODIUM: 141 mmol/L (ref 135–145)
Total Bilirubin: 0.3 mg/dL (ref 0.3–1.2)
Total Protein: 8 g/dL (ref 6.5–8.1)

## 2016-09-05 LAB — CBC WITH DIFFERENTIAL/PLATELET
Basophils Absolute: 0 10*3/uL (ref 0.0–0.1)
Basophils Relative: 0 %
EOS ABS: 0.1 10*3/uL (ref 0.0–0.7)
EOS PCT: 2 %
HCT: 37.1 % (ref 36.0–46.0)
HEMOGLOBIN: 12.3 g/dL (ref 12.0–15.0)
LYMPHS ABS: 2.1 10*3/uL (ref 0.7–4.0)
Lymphocytes Relative: 31 %
MCH: 29.9 pg (ref 26.0–34.0)
MCHC: 33.2 g/dL (ref 30.0–36.0)
MCV: 90 fL (ref 78.0–100.0)
MONO ABS: 0.4 10*3/uL (ref 0.1–1.0)
MONOS PCT: 6 %
Neutro Abs: 4 10*3/uL (ref 1.7–7.7)
Neutrophils Relative %: 61 %
PLATELETS: 317 10*3/uL (ref 150–400)
RBC: 4.12 MIL/uL (ref 3.87–5.11)
RDW: 14.2 % (ref 11.5–15.5)
WBC: 6.6 10*3/uL (ref 4.0–10.5)

## 2016-09-05 LAB — MAGNESIUM: Magnesium: 1.9 mg/dL (ref 1.7–2.4)

## 2016-09-05 NOTE — Patient Instructions (Signed)
Go straight to [facility] ED.

## 2016-09-05 NOTE — ED Triage Notes (Signed)
Pt reports numbness in her left leg x 3 weeks.  Pt reports her MD told her it was "poor circulation"  Pt reports that today the numbness feels worse and the leg feels tighter.  Pt reports being ambulatory.  Pt also reports that her left side of face feels numb like she has been to the dentist.  Pt reports left arm tingling.  Pt has no drooping to her face, slurred speech, or drift in triage.  Pt denies any pain, fever, or SOB.  Pt seen at urgent care earlier and was told to come to ED. Pt a/o x 4.

## 2016-09-05 NOTE — Progress Notes (Signed)
  09/05/2016 3:06 PM   DOB: 1951-03-21 / MRN: [id number]  SUBJECTIVE:  Kiara Ramirez is a 66 y.o. female presenting for left leg tingling.  She denies leg weakness.  She has a history of chronic knee pain and does not note any change in this.  Her back is not bothering her today either.  The numbness of the face also started today and describes this as numbness along the T2 left dermatome.    Tells me she has also felt lightheaded and tells me this is presyncopal.  Denies chest pain, palpitations, SOB, new DOE.  She also associates some arm left arm tingling.    She is allergic to ace inhibitors and hydrocodone-acetaminophen.   She  has a past medical history of Anxiety; B12 deficiency; Headache(784.0); and Hypertension.    She  reports that she has never smoked. She has never used smokeless tobacco. She reports that she does not drink alcohol or use drugs. She  has no sexual activity history on file. The patient  has a past surgical history that includes Tubal ligation and Colonoscopy (07/13/07).  Her family history includes Breast cancer in her sister.  Review of Systems  Constitutional: Negative for chills and fever.  Eyes: Negative.   Musculoskeletal: Positive for myalgias. Negative for falls.  Skin: Negative for itching and rash.  Neurological: Positive for dizziness, tingling, sensory change and focal weakness. Negative for speech change, seizures, loss of consciousness and headaches.    The problem list and medications were reviewed and updated by myself where necessary and exist elsewhere in the encounter.   OBJECTIVE:  BP (!) 152/84   Pulse 82   Resp 16   SpO2 98%   Physical Exam  Constitutional: She is oriented to person, place, and time. She is active.  Non-toxic appearance.  Cardiovascular: Normal rate and regular rhythm.   Pulmonary/Chest: Effort normal and breath sounds normal. No tachypnea.  Musculoskeletal: Normal range of motion. She exhibits no edema, tenderness or  deformity.  Neurological: She is alert and oriented to person, place, and time. She displays normal reflexes. No cranial nerve deficit. She exhibits normal muscle tone. Coordination normal.  Skin: Skin is warm and dry. She is not diaphoretic. No pallor.  Psychiatric: Her mood appears anxious.    No results found for this or any previous visit (from the past 72 hour(s)).  No results found.  ASSESSMENT AND PLAN:  Kyliyah was seen today for facial injury, leg pain and nausea.  Diagnoses and all orders for this visit:  Paresthesia:  Comments: I can not rule out an intracranial event.  Adivsed she call a family member and have them take her to the ED for neurological rule out. She is stable.     The patient is advised to call or return to clinic if she does not see an improvement in symptoms, or to seek the care of the closest emergency department if she worsens with the above plan.   Philis Fendt, MHS, PA-C [facility] 09/05/2016 3:06 PM

## 2016-09-05 NOTE — ED Provider Notes (Signed)
[facility] DEPT Provider Note   CSN: [id number] Arrival date & time: 09/05/16  1519     History   Chief Complaint Chief Complaint  Patient presents with  . Numbness    left leg, arm, face    HPI Kiara Ramirez is a 66 y.o. female.  HPI Patient has had episodic left leg paresthesias for several weeks. Today she developed left facial and left upper extremity tingling sensation. States that feels like "anesthetic is wearing off". Denies any focal weakness, vision changes or voice changes. Denies neck pain or low back pain. Symptoms started sometime around "mid-morning". She believes that the paresthesias have improved. Seen by her primary physician and referred to the emergency department for evaluation. Past Medical History:  Diagnosis Date  . Anxiety   . B12 deficiency   . Headache(784.0)   . Hypertension     Patient Active Problem List   Diagnosis Date Noted  . HYPERTROPHY OF TONSILS ALONE 03/07/2008  . ANXIETY 04/20/2007  . Weakness generalized 04/01/2007  . B12 deficiency 02/25/2007  . Essential hypertension 01/23/2007  . HEADACHE 01/23/2007    Past Surgical History:  Procedure Laterality Date  . COLONOSCOPY  07/13/07   per Dr. Fuller Plan, repeat in 5 yrs   . TUBAL LIGATION      OB History    No data available       Home Medications    Prior to Admission medications   Medication Sig Start Date End Date Taking? Authorizing Provider  aspirin 81 MG tablet Take 81 mg by mouth daily.     Yes [provider]  cyanocobalamin (,VITAMIN B-12,) 1000 MCG/ML injection Inject 1,000 mcg into the muscle every 30 (thirty) days. Reported on 06/28/2015   Yes [provider]  hydrochlorothiazide (HYDRODIURIL) 25 MG tablet Take 1 tablet (25 mg total) by mouth daily. 07/01/16  Yes Laurey Morale, MD  metoprolol (LOPRESSOR) 50 MG tablet Take 1 tablet (50 mg total) by mouth daily. 07/01/16  Yes Laurey Morale, MD  potassium chloride (KLOR-CON 10) 10 MEQ tablet Take 2  tablets (20 mEq total) by mouth 2 (two) times daily. 07/01/16  Yes Laurey Morale, MD    Family History Family History  Problem Relation Age of Onset  . Breast cancer Sister   . Cancer Unknown        colon/endometrial  . Hypertension Unknown        family hx  . Arthritis Unknown        family hx  . Diabetes Unknown        family hx    Social History Social History  Substance Use Topics  . Smoking status: Never Smoker  . Smokeless tobacco: Never Used  . Alcohol use No     Allergies   Oxycodone; Ace inhibitors; and Hydrocodone-acetaminophen   Review of Systems Review of Systems  Constitutional: Negative for chills and fever.  Eyes: Negative for visual disturbance.  Respiratory: Negative for cough and shortness of breath.   Cardiovascular: Negative for chest pain.  Gastrointestinal: Negative for abdominal pain, constipation, diarrhea, nausea and vomiting.  Musculoskeletal: Negative for back pain, gait problem, myalgias and neck pain.  Skin: Negative for rash and wound.  Neurological: Positive for numbness. Negative for dizziness, syncope, speech difficulty, weakness, light-headedness and headaches.  All other systems reviewed and are negative.    Physical Exam Updated Vital Signs BP (!) 154/85 (BP Location: Right Arm)   Pulse 78   Temp 98.1 F (36.7 C) (Oral)  Resp 18   Ht 5\' 2"  (1.575 m)   Wt 94.3 kg (208 lb)   SpO2 99%   BMI 38.04 kg/m   Physical Exam  Constitutional: She is oriented to person, place, and time. She appears well-developed and well-nourished. No distress.  HENT:  Head: Normocephalic and atraumatic.  Mouth/Throat: Oropharynx is clear and moist. No oropharyngeal exudate.  Eyes: EOM are normal. Pupils are equal, round, and reactive to light.  Neck: Normal range of motion. Neck supple.  No midline cervical tenderness to palpation.  Cardiovascular: Normal rate and regular rhythm.  Exam reveals no gallop and no friction rub.   No murmur  heard. Pulmonary/Chest: Effort normal and breath sounds normal. No respiratory distress. She has no wheezes. She has no rales. She exhibits no tenderness.  Abdominal: Soft. Bowel sounds are normal. There is no tenderness. There is no rebound and no guarding.  Musculoskeletal: Normal range of motion. She exhibits no edema or tenderness.  Distal pulses are 2+. No lower extremity swelling, asymmetry or tenderness. No midline thoracic or lumbar tenderness to palpation.  Neurological: She is alert and oriented to person, place, and time.  Mild paresthesias to the left lower face, dorsum of the left hand and left forearm. Sensation otherwise intact. 5/5 motor in all extremities. Speech is clear.  Skin: Skin is warm and dry. Capillary refill takes less than 2 seconds. No rash noted. No erythema.  Psychiatric: She has a normal mood and affect. Her behavior is normal.  Nursing note and vitals reviewed.    ED Treatments / Results  Labs (all labs ordered are listed, but only abnormal results are displayed) Labs Reviewed  COMPREHENSIVE METABOLIC PANEL - Abnormal; Notable for the following:       Result Value   ALT 11 (*)    All other components within normal limits  CBC WITH DIFFERENTIAL/PLATELET  MAGNESIUM    EKG  EKG Interpretation None       Radiology Mr Brain Wo Contrast  Result Date: 09/05/2016 CLINICAL DATA:  Left leg numbness for 3 weeks and new left facial numbness. EXAM: MRI HEAD WITHOUT CONTRAST TECHNIQUE: Multiplanar, multiecho pulse sequences of the brain and surrounding structures were obtained without intravenous contrast. COMPARISON:  Head CT 04/28/2015 FINDINGS: Brain: The midline structures are normal. There is no focal diffusion restriction to indicate acute infarct. The brain parenchymal signal is normal and there is no mass lesion. No intraparenchymal hematoma or chronic microhemorrhage. Brain volume is normal for age without age-advanced or lobar predominant atrophy. The  dura is normal and there is no extra-axial collection. Vascular: Major intracranial arterial and venous sinus flow voids are preserved. Skull and upper cervical spine: The visualized skull base, calvarium, upper cervical spine and extracranial soft tissues are normal. Sinuses/Orbits: No fluid levels or advanced mucosal thickening. No mastoid effusion. Normal orbits. IMPRESSION: Normal MRI of the brain. Electronically Signed   By: Ulyses Jarred M.D.   On: 09/05/2016 17:18    Procedures Procedures (including critical care time)  Medications Ordered in ED Medications - No data to display   Initial Impression / Assessment and Plan / ED Course  I have reviewed the triage vital signs and the nursing notes.  Pertinent labs & imaging results that were available during my care of the patient were reviewed by me and considered in my medical decision making (see chart for details).     MRI with no abnormalities. Electrolytes are normal. Patient received her B-12 shot today. Advised to follow-up with  neurology for further evaluation. Return precautions given. Final Clinical Impressions(s) / ED Diagnoses   Final diagnoses:  Paresthesias    New Prescriptions New Prescriptions   No medications on file     Julianne Rice, MD 09/05/16 1743

## 2016-09-05 NOTE — ED Notes (Signed)
Pt transported to MRI 

## 2016-09-06 DIAGNOSIS — Z79899 Other long term (current) drug therapy: Secondary | ICD-10-CM | POA: Diagnosis not present

## 2016-09-06 DIAGNOSIS — R209 Unspecified disturbances of skin sensation: Secondary | ICD-10-CM | POA: Diagnosis not present

## 2016-09-06 DIAGNOSIS — I10 Essential (primary) hypertension: Secondary | ICD-10-CM | POA: Diagnosis not present

## 2016-09-06 DIAGNOSIS — E876 Hypokalemia: Secondary | ICD-10-CM | POA: Diagnosis not present

## 2016-09-06 DIAGNOSIS — Z7982 Long term (current) use of aspirin: Secondary | ICD-10-CM | POA: Diagnosis not present

## 2016-09-06 DIAGNOSIS — R42 Dizziness and giddiness: Secondary | ICD-10-CM | POA: Diagnosis present

## 2016-09-06 LAB — CBC
HCT: 37.3 % (ref 36.0–46.0)
Hemoglobin: 12.5 g/dL (ref 12.0–15.0)
MCH: 30.1 pg (ref 26.0–34.0)
MCHC: 33.5 g/dL (ref 30.0–36.0)
MCV: 89.9 fL (ref 78.0–100.0)
PLATELETS: 299 10*3/uL (ref 150–400)
RBC: 4.15 MIL/uL (ref 3.87–5.11)
RDW: 14.1 % (ref 11.5–15.5)
WBC: 7.9 10*3/uL (ref 4.0–10.5)

## 2016-09-06 LAB — CBG MONITORING, ED: Glucose-Capillary: 113 mg/dL — ABNORMAL HIGH (ref 65–99)

## 2016-09-06 LAB — BASIC METABOLIC PANEL
Anion gap: 6 (ref 5–15)
BUN: 19 mg/dL (ref 6–20)
CALCIUM: 8.6 mg/dL — AB (ref 8.9–10.3)
CO2: 29 mmol/L (ref 22–32)
CREATININE: 1.09 mg/dL — AB (ref 0.44–1.00)
Chloride: 105 mmol/L (ref 101–111)
GFR calc Af Amer: >60 mL/min (ref >60)
GFR, EST NON AFRICAN AMERICAN: 52 mL/min — AB (ref >60)
GLUCOSE: 111 mg/dL — AB (ref 65–99)
Potassium: 3.3 mmol/L — ABNORMAL LOW (ref 3.5–5.1)
SODIUM: 140 mmol/L (ref 135–145)

## 2016-09-06 NOTE — ED Triage Notes (Signed)
Pt reports worsening numbness along with nausea. Pt denies pain at this time. No facial droop, slurred speech or limb drift noted in triage. Pt A+OX4, speaking in complete sentences.

## 2016-09-07 ENCOUNTER — Emergency Department (HOSPITAL_COMMUNITY)
Admission: EM | Admit: 2016-09-07 | Discharge: 2016-09-07 | Disposition: A | Discharge status: Home / Self Care | Payer: BC Managed Care – PPO | Attending: Emergency Medicine | Admitting: Emergency Medicine

## 2016-09-07 DIAGNOSIS — R202 Paresthesia of skin: Secondary | ICD-10-CM

## 2016-09-07 DIAGNOSIS — E876 Hypokalemia: Secondary | ICD-10-CM

## 2016-09-07 LAB — URINALYSIS, ROUTINE W REFLEX MICROSCOPIC
Bacteria, UA: NONE SEEN
Bilirubin Urine: NEGATIVE
Glucose, UA: NEGATIVE mg/dL
KETONES UR: NEGATIVE mg/dL
Leukocytes, UA: NEGATIVE
NITRITE: NEGATIVE
PROTEIN: NEGATIVE mg/dL
SPECIFIC GRAVITY, URINE: 1.019 (ref 1.005–1.030)
pH: 5 (ref 5.0–8.0)

## 2016-09-07 MED ORDER — POTASSIUM CHLORIDE CRYS ER 20 MEQ PO TBCR: 20.0000 meq | EXTENDED_RELEASE_TABLET | Freq: Two times a day (BID) | ORAL | 0 refills | Status: DC

## 2016-09-07 NOTE — Discharge Instructions (Signed)
You have had a MRI that does not show any evidence of an acute stroke and your physical exam today does not show any evidence of a stroke. Please get an appointment with a neurologist, and I gave you 2 office's that you can call, to have a neurologist evaluate your numbness. Take the potassium pills until gone.

## 2016-09-07 NOTE — ED Provider Notes (Signed)
[facility] DEPT Provider Note   CSN: [id number] Arrival date & time: 09/06/16  2257   By signing my name below, I, Eunice Blase, attest that this documentation has been prepared under the direction and in the presence of Rolland Porter, MD. Electronically signed, Eunice Blase, ED Scribe. 09/07/16. 1:55 AM.   Time seen 12:38 AM  History   Chief Complaint Chief Complaint  Patient presents with  . Numbness  . Nausea   The history is provided by the patient and medical records. No language interpreter was used.    Kiara Ramirez is a 66 y.o. female with h/o medication controlled HTN who presents to the Emergency Department with concern for sudden onset lightheadedness while sitting at her computer ~10:30 tonight. She states this lasted 5-10 minutes and this last occurred the day prior with L leg tingling. This has allegedly been a worsening problem x ~3 months. Associated near syncope sensation and nausea noted; these have since subsided. Increased stress noted recently. Pt seen for similar symptoms in [facility] ED on 09/05/2016. She had a MRI of her brain done that was normal. She notes L leg numbness and L calf "knotting" after sitting for extended periods of time. It gets better when she stands up and shakes her leg. Pt also notes sensation of "anesthesia going away" intermittently on the L side of her face yesterday; she states these episodes last for a few minutes at a time. Pt adds her extremity numbness is relieved by moving the affected areas when she experiences the sensations. She states the facial numbness and leg numbness normally do not occur together until yesterday. Pt further notes increased fatigue x 3 weeks. Pt last saw PCP in Feb and March 2018 and has discussed these symptoms with him. Pt receives vitamin B-12 shots and she last received one ~1 week ago. She states this has relieved sensation of "bursts all over the inside of my body". Pt works in a Education [employer], environmental. FMHx of  CHF noted. No family history of strokes. No alcohol or tobacco use noted. No headache, chest pain, diaphoresis, SOB or vomiting noted. She denies any numbness or tingling in her left upper extremity. No FMHx of stroke. No other complaints at this time.   PCP Laurey Morale, MD   Past Medical History:  Diagnosis Date  . Anxiety   . B12 deficiency   . Headache(784.0)   . Hypertension     Patient Active Problem List   Diagnosis Date Noted  . HYPERTROPHY OF TONSILS ALONE 03/07/2008  . ANXIETY 04/20/2007  . Weakness generalized 04/01/2007  . B12 deficiency 02/25/2007  . Essential hypertension 01/23/2007  . HEADACHE 01/23/2007    Past Surgical History:  Procedure Laterality Date  . COLONOSCOPY  07/13/07   per Dr. Fuller Plan, repeat in 5 yrs   . TUBAL LIGATION      OB History    No data available       Home Medications    Prior to Admission medications   Medication Sig Start Date End Date Taking? Authorizing Provider  aspirin 81 MG tablet Take 81 mg by mouth daily.     Yes [provider]  cyanocobalamin (,VITAMIN B-12,) 1000 MCG/ML injection Inject 1,000 mcg into the muscle every 30 (thirty) days. Reported on 06/28/2015   Yes [provider]  hydrochlorothiazide (HYDRODIURIL) 25 MG tablet Take 1 tablet (25 mg total) by mouth daily. 07/01/16  Yes Laurey Morale, MD  metoprolol (LOPRESSOR) 50 MG tablet Take  1 tablet (50 mg total) by mouth daily. 07/01/16  Yes Laurey Morale, MD  potassium chloride (KLOR-CON 10) 10 MEQ tablet Take 2 tablets (20 mEq total) by mouth 2 (two) times daily. 07/01/16  Yes Laurey Morale, MD  potassium chloride SA (K-DUR,KLOR-CON) 20 MEQ tablet Take 1 tablet (20 mEq total) by mouth 2 (two) times daily. 09/07/16   Rolland Porter, MD    Family History Family History  Problem Relation Age of Onset  . Breast cancer Sister   . Cancer Unknown        colon/endometrial  . Hypertension Unknown        family hx  . Arthritis Unknown        family hx    . Diabetes Unknown        family hx    Social History Social History  Substance Use Topics  . Smoking status: Never Smoker  . Smokeless tobacco: Never Used  . Alcohol use No  employed   Allergies   Oxycodone; Ace inhibitors; and Hydrocodone-acetaminophen   Review of Systems Review of Systems  Constitutional: Negative for diaphoresis.  Respiratory: Negative for shortness of breath.   Cardiovascular: Negative for chest pain.  Gastrointestinal: Positive for nausea. Negative for abdominal pain and vomiting.  Neurological: Positive for light-headedness and numbness. Negative for headaches.  All other systems reviewed and are negative.    Physical Exam Updated Vital Signs BP (!) 161/86 (BP Location: Right Arm)   Pulse 76   Temp 98.3 F (36.8 C) (Oral)   Resp 20   SpO2 95%   Vital signs normal except for hypertension   Physical Exam  Constitutional: She is oriented to person, place, and time. She appears well-developed and well-nourished.  Non-toxic appearance. She does not appear ill. No distress.  HENT:  Head: Normocephalic and atraumatic.  Right Ear: External ear normal.  Left Ear: External ear normal.  Nose: Nose normal. No mucosal edema or rhinorrhea.  Mouth/Throat: Oropharynx is clear and moist and mucous membranes are normal. No dental abscesses or uvula swelling.  Eyes: Conjunctivae and EOM are normal. Pupils are equal, round, and reactive to light.  Neck: Normal range of motion and full passive range of motion without pain. Neck supple. Carotid bruit is not present.  No bruits bilaterally  Cardiovascular: Normal rate, regular rhythm and normal heart sounds.  Exam reveals no gallop and no friction rub.   No murmur heard. Pulmonary/Chest: Effort normal and breath sounds normal. No respiratory distress. She has no wheezes. She has no rhonchi. She has no rales. She exhibits no tenderness and no crepitus.  Abdominal: Soft. Normal appearance and bowel sounds are  normal. She exhibits no distension. There is no tenderness. There is no rebound and no guarding.  Musculoskeletal: Normal range of motion. She exhibits no edema or tenderness.  Moves all extremities well.   Neurological: She is alert and oriented to person, place, and time. She has normal strength. No cranial nerve deficit.  No pronator drift. Grips equal bilaterally. No motor weakness.  Skin: Skin is warm, dry and intact. No rash noted. No erythema. No pallor.  Psychiatric: She has a normal mood and affect. Her speech is normal and behavior is normal. Her mood appears not anxious.  Nursing note and vitals reviewed.    ED Treatments / Results  DIAGNOSTIC STUDIES: Oxygen Saturation is 95% on RA, adequate by my interpretation.       Labs (all labs ordered are listed, but only abnormal results  are displayed) Results for orders placed or performed during the hospital encounter of 37/16/96  Basic metabolic panel  Result Value Ref Range   Sodium 140 135 - 145 mmol/L   Potassium 3.3 (L) 3.5 - 5.1 mmol/L   Chloride 105 101 - 111 mmol/L   CO2 29 22 - 32 mmol/L   Glucose, Bld 111 (H) 65 - 99 mg/dL   BUN 19 6 - 20 mg/dL   Creatinine, Ser 1.09 (H) 0.44 - 1.00 mg/dL   Calcium 8.6 (L) 8.9 - 10.3 mg/dL   GFR calc non Af Amer 52 (L) >60 mL/min   GFR calc Af Amer >60 >60 mL/min   Anion gap 6 5 - 15  CBC  Result Value Ref Range   WBC 7.9 4.0 - 10.5 K/uL   RBC 4.15 3.87 - 5.11 MIL/uL   Hemoglobin 12.5 12.0 - 15.0 g/dL   HCT 37.3 36.0 - 46.0 %   MCV 89.9 78.0 - 100.0 fL   MCH 30.1 26.0 - 34.0 pg   MCHC 33.5 30.0 - 36.0 g/dL   RDW 14.1 11.5 - 15.5 %   Platelets 299 150 - 400 K/uL  Urinalysis, Routine w reflex microscopic  Result Value Ref Range   Color, Urine YELLOW YELLOW   APPearance CLEAR CLEAR   Specific Gravity, Urine 1.019 1.005 - 1.030   pH 5.0 5.0 - 8.0   Glucose, UA NEGATIVE NEGATIVE mg/dL   Hgb urine dipstick MODERATE (A) NEGATIVE   Bilirubin Urine NEGATIVE NEGATIVE    Ketones, ur NEGATIVE NEGATIVE mg/dL   Protein, ur NEGATIVE NEGATIVE mg/dL   Nitrite NEGATIVE NEGATIVE   Leukocytes, UA NEGATIVE NEGATIVE   RBC / HPF 0-5 0 - 5 RBC/hpf   WBC, UA 0-5 0 - 5 WBC/hpf   Bacteria, UA NONE SEEN NONE SEEN   Squamous Epithelial / LPF 0-5 (A) NONE SEEN   Mucous PRESENT   CBG monitoring, ED  Result Value Ref Range   Glucose-Capillary 113 (H) 65 - 99 mg/dL   Laboratory interpretation all normal except Mild hypokalemia most likely from her diuretic.    EKG  EKG Interpretation  Date/Time:  Friday Sep 06 2016 23:15:06 EDT Ventricular Rate:  75 PR Interval:    QRS Duration: 90 QT Interval:  394 QTC Calculation: 441 R Axis:   -16 Text Interpretation:  Sinus rhythm Borderline left axis deviation Baseline wander in lead(s) V1 No significant change since last tracing 05 Sep 2016 Confirmed by Rolland Porter [phone]) on 09/07/2016 12:52:33 AM       Radiology Mr Brain Wo Contrast  Result Date: 09/05/2016 CLINICAL DATA:  Left leg numbness for 3 weeks and new left facial numbness. EXAM: MRI HEAD WITHOUT CONTRAST TECHNIQUE: Multiplanar, multiecho pulse sequences of the brain and surrounding structures were obtained without intravenous contrast. COMPARISON:  Head CT 04/28/2015 FINDINGS: Brain: The midline structures are normal. There is no focal diffusion restriction to indicate acute infarct. The brain parenchymal signal is normal and there is no mass lesion. No intraparenchymal hematoma or chronic microhemorrhage. Brain volume is normal for age without age-advanced or lobar predominant atrophy. The dura is normal and there is no extra-axial collection. Vascular: Major intracranial arterial and venous sinus flow voids are preserved. Skull and upper cervical spine: The visualized skull base, calvarium, upper cervical spine and extracranial soft tissues are normal. Sinuses/Orbits: No fluid levels or advanced mucosal thickening. No mastoid effusion. Normal orbits. IMPRESSION: Normal  MRI of the brain. Electronically Signed   By: Lennette Bihari  Collins [place] M.D.   On: 09/05/2016 17:18    Procedures Procedures (including critical care time)  Medications Ordered in ED Medications - No data to display   Initial Impression / Assessment and Plan / ED Course  I have reviewed the triage vital signs and the nursing notes.  Pertinent labs & imaging results that were available during my care of the patient were reviewed by me and considered in my medical decision making (see chart for details).  COORDINATION OF CARE: 12:48 AM-Discussed next steps with pt. Pt verbalized understanding and is agreeable with the plan.  02:02 AM discussed with Dr Cheral Marker, neurology. He feels her symptoms are more likely related to peripheral nerve problem and feels she does not need admitted for TIA evaluation. He wants her to be referred to an outpatient neurologist.  Final Clinical Impressions(s) / ED Diagnoses   Final diagnoses:  Paresthesia  Hypokalemia    New Prescriptions New Prescriptions   POTASSIUM CHLORIDE SA (K-DUR,KLOR-CON) 20 MEQ TABLET    Take 1 tablet (20 mEq total) by mouth 2 (two) times daily.   Plan discharge  Rolland Porter, MD, FACEP  I personally performed the services described in this documentation, which was scribed in my presence. The recorded information has been reviewed and considered.   Rolland Porter, MD, Barbette Or, MD 09/07/16 [phone]

## 2016-09-10 ENCOUNTER — Encounter: Payer: Self-pay | Admitting: Neurology

## 2016-09-10 ENCOUNTER — Ambulatory Visit (INDEPENDENT_AMBULATORY_CARE_PROVIDER_SITE_OTHER): Payer: BC Managed Care – PPO | Admitting: Family Medicine

## 2016-09-10 ENCOUNTER — Encounter: Payer: Self-pay | Admitting: Family Medicine

## 2016-09-10 VITALS — BP 101/83 | HR 69 | Temp 98.1°F | Ht 62.0 in | Wt 210.0 lb

## 2016-09-10 DIAGNOSIS — R202 Paresthesia of skin: Secondary | ICD-10-CM | POA: Diagnosis not present

## 2016-09-10 DIAGNOSIS — R200 Anesthesia of skin: Secondary | ICD-10-CM | POA: Diagnosis not present

## 2016-09-10 DIAGNOSIS — E538 Deficiency of other specified B group vitamins: Secondary | ICD-10-CM | POA: Diagnosis not present

## 2016-09-10 MED ORDER — METOPROLOL TARTRATE 50 MG PO TABS: 25.0000 mg | ORAL_TABLET | Freq: Two times a day (BID) | ORAL | 3 refills | Status: DC

## 2016-09-10 MED ORDER — CYANOCOBALAMIN 1000 MCG/ML IJ SOLN
1000.0000 ug | Freq: Once | INTRAMUSCULAR | Status: AC
  Administered 2016-09-10: 1000 ug via INTRAMUSCULAR

## 2016-09-10 MED ORDER — POTASSIUM CHLORIDE ER 10 MEQ PO TBCR: 20.0000 meq | EXTENDED_RELEASE_TABLET | Freq: Two times a day (BID) | ORAL | 3 refills | Status: DC

## 2016-09-10 NOTE — Patient Instructions (Signed)
WE NOW OFFER   [facility] FAST TRACK!!!  SAME DAY Appointments for ACUTE CARE  Such as: Sprains, Injuries, cuts, abrasions, rashes, muscle pain, joint pain, back pain Colds, flu, sore throats, headache, allergies, cough, fever  Ear pain, sinus and eye infections Abdominal pain, nausea, vomiting, diarrhea, upset stomach Animal/insect bites  3 Easy Ways to Schedule: Walk-In Scheduling Call in scheduling Mychart Sign-up: [email or web address]         

## 2016-09-10 NOTE — Progress Notes (Signed)
   Subjective:    Patient ID: Kiara Ramirez, female    DOB: December 30, 1950, 66 y.o.   MRN: [id number]  HPI Here to follow up on ER visits on 09-05-16 and again on 09-07-16. Both of these visits were due to intermittent numbness and tingling she was experiencing in the legs and the face, lightheadedness, and nausea without vomiting. Her labs the first time were normal with a potassium of 3.5, but labs at the second visit showed a low potassium of 3.3. Her magnesium was normal at 1.9. A brain MRI was completely normal. Neurology was consulted and they felt she do not require hospitalization, but they did recommend an outpatient Neurology visit. She was told to increase her potassium and to see [place]. Since then she has felt better, but she still has the numbness and tingling.    Review of Systems  Constitutional: Negative.   Respiratory: Negative.   Cardiovascular: Negative.   Neurological: Positive for light-headedness and numbness. Negative for dizziness, tremors, seizures, syncope, facial asymmetry, speech difficulty, weakness and headaches.       Objective:   Physical Exam  Constitutional: She is oriented to person, place, and time. She appears well-developed and well-nourished. No distress.  Neck: No thyromegaly present.  Cardiovascular: Normal rate, regular rhythm, normal heart sounds and intact distal pulses.   Pulmonary/Chest: Effort normal and breath sounds normal. No respiratory distress. She has no wheezes. She has no rales.  Lymphadenopathy:    She has no cervical adenopathy.  Neurological: She is alert and oriented to person, place, and time. No cranial nerve deficit. Coordination normal.          Assessment & Plan:  She has had numbness and tingling along with lightheadedness. Her recent potassium level was low, but I am not sure that this explains all her symptoms. We will have her increase the potassium to a total of 20 mEq bid. We will refer her to Neurology for further evaluation  as well.  Alysia Penna, MD

## 2016-09-10 NOTE — Addendum Note (Signed)
Addended by: Aggie Hacker A on: 09/10/2016 11:58 AM   Modules accepted: Orders

## 2016-09-24 ENCOUNTER — Ambulatory Visit (INDEPENDENT_AMBULATORY_CARE_PROVIDER_SITE_OTHER): Payer: BC Managed Care – PPO | Admitting: Family Medicine

## 2016-09-24 ENCOUNTER — Encounter: Payer: Self-pay | Admitting: Family Medicine

## 2016-09-24 VITALS — BP 110/68 | Temp 98.2°F | Ht 62.0 in | Wt 211.0 lb

## 2016-09-24 DIAGNOSIS — R202 Paresthesia of skin: Secondary | ICD-10-CM | POA: Diagnosis not present

## 2016-09-24 DIAGNOSIS — I10 Essential (primary) hypertension: Secondary | ICD-10-CM | POA: Diagnosis not present

## 2016-09-24 DIAGNOSIS — R200 Anesthesia of skin: Secondary | ICD-10-CM

## 2016-09-24 DIAGNOSIS — M79605 Pain in left leg: Secondary | ICD-10-CM | POA: Diagnosis not present

## 2016-09-24 LAB — BASIC METABOLIC PANEL
BUN: 19 mg/dL (ref 6–23)
CALCIUM: 9.1 mg/dL (ref 8.4–10.5)
CO2: 31 mEq/L (ref 19–32)
CREATININE: 0.82 mg/dL (ref 0.40–1.20)
Chloride: 103 mEq/L (ref 96–112)
GFR: 90.02 mL/min (ref >60.00)
Glucose, Bld: 86 mg/dL (ref 70–99)
Potassium: 4 mEq/L (ref 3.5–5.1)
Sodium: 140 mEq/L (ref 135–145)

## 2016-09-24 LAB — VITAMIN B12: VITAMIN B 12: 630 pg/mL (ref 211–911)

## 2016-09-24 NOTE — Progress Notes (Signed)
   Subjective:    Patient ID: Kiara Ramirez, female    DOB: [birth year], 66 y.o.   MRN: [id number]  HPI Here to follow up on numbness and tingling in both legs. She was seen 2 weeks ago and her b12 level was within target range but potassium was borderline low. We increased the potassium to a total of 20 mEq bid. Now the right leg feels a little better, but she still has these sensations in the left leg. In addition she now describes tightness in the left leg and occasional pain in the calf. Both legs have mild swelling that comes and goes.    Review of Systems  Constitutional: Negative.   Respiratory: Negative.   Cardiovascular: Positive for leg swelling. Negative for chest pain and palpitations.  Musculoskeletal: Positive for myalgias.  Neurological: Positive for numbness. Negative for weakness.       Objective:   Physical Exam  Constitutional: She is oriented to person, place, and time. She appears well-developed and well-nourished. No distress.  Cardiovascular: Normal rate, regular rhythm, normal heart sounds and intact distal pulses.   Pulmonary/Chest: Effort normal and breath sounds normal. No respiratory distress. She has no wheezes. She has no rales.  Musculoskeletal:  No edema in either leg or in the feet. The left calf is mildly tender. No cords are felt. Kiara Ramirez is negative  Neurological: She is alert and oriented to person, place, and time.          Assessment & Plan:  Numbness and tingling in the legs and now with tightness in the left calf. Recheck a BMET and a B12 today. Set up a venous doppler to rule out a DVT in the left leg. She is scheduled to see Dr. Posey Pronto on 12-09-16.  Kiara Penna, MD

## 2016-09-24 NOTE — Patient Instructions (Signed)
WE NOW OFFER   [facility] FAST TRACK!!!  SAME DAY Appointments for ACUTE CARE  Such as: Sprains, Injuries, cuts, abrasions, rashes, muscle pain, joint pain, back pain Colds, flu, sore throats, headache, allergies, cough, fever  Ear pain, sinus and eye infections Abdominal pain, nausea, vomiting, diarrhea, upset stomach Animal/insect bites  3 Easy Ways to Schedule: Walk-In Scheduling Call in scheduling Mychart Sign-up: [email or web address]         

## 2016-10-30 ENCOUNTER — Ambulatory Visit (HOSPITAL_COMMUNITY)
Admission: RE | Admit: 2016-10-30 | Discharge: 2016-10-30 | Disposition: A | Discharge status: Home / Self Care | Payer: BC Managed Care – PPO | Source: Ambulatory Visit | Attending: Cardiology | Admitting: Cardiology

## 2016-10-30 ENCOUNTER — Ambulatory Visit (INDEPENDENT_AMBULATORY_CARE_PROVIDER_SITE_OTHER): Payer: BC Managed Care – PPO | Admitting: *Deleted

## 2016-10-30 DIAGNOSIS — E538 Deficiency of other specified B group vitamins: Secondary | ICD-10-CM

## 2016-10-30 DIAGNOSIS — M79605 Pain in left leg: Secondary | ICD-10-CM | POA: Insufficient documentation

## 2016-10-30 MED ORDER — CYANOCOBALAMIN 1000 MCG/ML IJ SOLN
1000.0000 ug | Freq: Once | INTRAMUSCULAR | Status: AC
  Administered 2016-10-30: 1000 ug via INTRAMUSCULAR

## 2016-10-30 NOTE — Progress Notes (Signed)
Patient here for b12 injection; administered IM to left deltoid; patient tolerated well; no s/s of reactions 

## 2016-12-09 ENCOUNTER — Ambulatory Visit (INDEPENDENT_AMBULATORY_CARE_PROVIDER_SITE_OTHER): Payer: BC Managed Care – PPO | Admitting: Neurology

## 2016-12-09 ENCOUNTER — Encounter: Payer: Self-pay | Admitting: Neurology

## 2016-12-09 VITALS — BP 130/80 | HR 52 | Ht 62.0 in

## 2016-12-09 DIAGNOSIS — R200 Anesthesia of skin: Secondary | ICD-10-CM | POA: Diagnosis not present

## 2016-12-09 DIAGNOSIS — R202 Paresthesia of skin: Secondary | ICD-10-CM

## 2016-12-09 DIAGNOSIS — M6281 Muscle weakness (generalized): Secondary | ICD-10-CM

## 2016-12-09 NOTE — Progress Notes (Signed)
[facility] Clinic Note - Initial Visit   Date: 12/09/16  Kiara Ramirez MRN: [id number] DOB: [birth year]   Dear Dr. Sarajane Jews:  Thank you for your kind referral of Kiara Ramirez for consultation of paresthesias. Although her history is well known to you, please allow [place] to reiterate it for the purpose of our medical record. The patient was accompanied to the clinic by self.    History of Present Illness: Kiara Ramirez is a 66 y.o. right-handed African American female with hyperension and vitamin B12 deficiency presenting for evaluation of left sided paresthesias.   Starting around May 2018, she developed numbness/tingling of the left face, arm and leg which prompted her to go to the ER twice. She had MRI brain which was normal.  She does not have any more spells of numbness and tingling of the left face and arm, but continues to have sporadic numbness and tightness of the left leg.  It lasts ~30-minutes and occurs about once per month. She denies any exacerbating or alleviating factors.  She did not have any lateralizing weakness. She had US of the leg which was negative for DVT.  She takes a daily aspirin.  She is very worried about these symptoms and became tearful in the office.    She also complain about achy localized pain at the ball of her left foot in between her 2nd and 3rd toe, which is worse with weight bearing. There is no numbness or tingling.  Out-side paper records, electronic medical record, and images have been reviewed where available and summarized as:  MRI brain wo contrast 09/05/2016:  Normal  Lab Results  Component Value Date   TSH 1.08 06/24/2016   Lab Results  Component Value Date   VITAMINB12 630 09/24/2016     Past Medical History:  Diagnosis Date  . Anxiety   . B12 deficiency   . Headache(784.0)   . Hypertension     Past Surgical History:  Procedure Laterality Date  . COLONOSCOPY  07/13/07   per Dr. Fuller Plan, repeat in 5 yrs   . TUBAL  LIGATION       Medications:  Outpatient Encounter Prescriptions as of 12/09/2016  Medication Sig Note  . aspirin 81 MG tablet Take 81 mg by mouth daily.   09/07/2016: Unsure of exact time of dose  . cyanocobalamin (,VITAMIN B-12,) 1000 MCG/ML injection Inject 1,000 mcg into the muscle every 30 (thirty) days. Reported on 06/28/2015   . hydrochlorothiazide (HYDRODIURIL) 25 MG tablet Take 1 tablet (25 mg total) by mouth daily.   . metoprolol tartrate (LOPRESSOR) 50 MG tablet Take 0.5 tablets (25 mg total) by mouth 2 (two) times daily.   . potassium chloride (KLOR-CON 10) 10 MEQ tablet Take 2 tablets (20 mEq total) by mouth 2 (two) times daily.    No facility-administered encounter medications on file as of 12/09/2016.      Allergies:  Allergies  Allergen Reactions  . Oxycodone Other (See Comments)  . Ace Inhibitors   . Hydrocodone-Acetaminophen Nausea Only    Family History: Family History  Problem Relation Age of Onset  . Breast cancer Sister   . Cancer Unknown        colon/endometrial  . Hypertension Unknown        family hx  . Arthritis Unknown        family hx  . Diabetes Unknown        family hx    Social History: Social History  Substance Use Topics  .  Smoking status: Never Smoker  . Smokeless tobacco: Never Used  . Alcohol use No   Social History   Social History Narrative  . No narrative on file    Review of Systems:  CONSTITUTIONAL: No fevers, chills, night sweats, or weight loss.   EYES: No visual changes or eye pain ENT: No hearing changes.  No history of nose bleeds.   RESPIRATORY: No cough, wheezing and shortness of breath.   CARDIOVASCULAR: Negative for chest pain, and palpitations.   GI: Negative for abdominal discomfort, blood in stools or black stools.  No recent change in bowel habits.   GU:  No history of incontinence.   MUSCLOSKELETAL: No history of joint pain +swelling.  +myalgias.   SKIN: Negative for lesions, rash, and itching.     HEMATOLOGY/ONCOLOGY: Negative for prolonged bleeding, bruising easily, and swollen nodes.  No history of cancer.   ENDOCRINE: Negative for cold or heat intolerance, polydipsia or goiter.   PSYCH:  No depression anxiety symptoms.   NEURO: As Above.   Vital Signs:  BP 130/80   Pulse (!) 52   Ht 5\' 2"  (1.575 m)   SpO2 96%    General Medical Exam:   General:  Well appearing, comfortable.   Eyes/ENT: see cranial nerve examination.   Neck: No masses appreciated.  Full range of motion without tenderness.  No carotid bruits. Respiratory:  Clear to auscultation, good air entry bilaterally.   Cardiac:  Regular rate and rhythm, no murmur.   Extremities:  Localized tenderness to palpation in between the 2nd and 3rd metatarsal space.  No edema, or skin discoloration.  Skin:  No rashes or lesions.  Neurological Exam: MENTAL STATUS including orientation to time, place, person, recent and remote memory, attention span and concentration, language, and fund of knowledge is normal.  Speech is not dysarthric.  CRANIAL NERVES: II:  No visual field defects.  III-IV-VI: Pupils equal round and reactive to light.  Normal conjugate, extra-ocular eye movements in all directions of gaze.  No nystagmus.  No ptosis.   V:  Normal facial sensation   VII:  Normal facial symmetry and movements.   VIII:  Normal hearing and vestibular function.   IX-X:  Normal palatal movement.   XI:  Normal shoulder shrug and head rotation.   XII:  Normal tongue strength and range of motion, no deviation or fasciculation.  MOTOR:  No atrophy, fasciculations or abnormal movements.  No pronator drift.  Tone is normal.    Right Upper Extremity:    Left Upper Extremity:    Deltoid  5/5   Deltoid  5/5   Biceps  5/5   Biceps  5/5   Triceps  5/5   Triceps  5/5   Wrist extensors  5/5   Wrist extensors  5/5   Wrist flexors  5/5   Wrist flexors  5/5   Finger extensors  5/5   Finger extensors  5/5   Finger flexors  5/5   Finger  flexors  5/5   Dorsal interossei  5/5   Dorsal interossei  5/5   Abductor pollicis  5/5   Abductor pollicis  5/5   Tone (Ashworth scale)  0  Tone (Ashworth scale)  0   Right Lower Extremity:    Left Lower Extremity:    Hip flexors  5/5   Hip flexors  5/5   Hip extensors  5/5   Hip extensors  5/5   Knee flexors  5/5   Knee flexors  5/5   Knee extensors  5/5   Knee extensors  5/5   Dorsiflexors  5/5   Dorsiflexors  5/5   Plantarflexors  5/5   Plantarflexors  5/5   Toe extensors  5/5   Toe extensors  5/5   Toe flexors  5/5   Toe flexors  5/5   Tone (Ashworth scale)  0  Tone (Ashworth scale)  0   MSRs:  Right                                                                 Left brachioradialis 2+  brachioradialis 2+  biceps 2+  biceps 2+  triceps 2+  triceps 2+  patellar 2+  patellar 2+  ankle jerk 2+  ankle jerk 2+  Hoffman no  Hoffman no  plantar response down  plantar response down   SENSORY:  Normal and symmetric perception of light touch, pinprick, vibration, and proprioception.  Romberg's sign absent.   COORDINATION/GAIT: Normal finger-to- nose-finger and heel-to-shin.  Intact rapid alternating movements bilaterally.  Able to rise from a chair without using arms.  Gait narrow based and stable. Tandem and stressed gait intact.    IMPRESSION: Mrs. Franne Forts is a 66 year-old female referred for evaluation of transient left hemisensory paresthesias and intermittent left leg tightness.  Her exam is entirely normal and non-focal making it difficult to isolate underlying etiology.  Given the lateralizing symptoms, I recommend US carotids and CTA head to evaluate for cerebrovascular disease as TIA is possible.  She is compliant with daily aspirin 81mg  and her blood pressure medications.  Her cholesterol is well-controlled and she had no history of diabetes.  If this is normal, we can proceed with NCS/EMG of the left leg to see if her leg discomfort is stemming from a nerve/muscle pathology.    Regarding her left foot pain, recommend evaluation by podiatry.  I do not see this to me a primary neurological condition as her symptoms are very localized, reproduced by palpation, and lack neuropathic features.    The duration of this appointment visit was 40 minutes of face-to-face time with the patient.  Greater than 50% of this time was spent in counseling, explanation of diagnosis, planning of further management, and coordination of care.   Thank you for allowing me to participate in patient's care.  If I can answer any additional questions, I would be pleased to do so.    Sincerely,    Donika K. Posey Pronto, DO

## 2016-12-09 NOTE — Patient Instructions (Addendum)
CT angiogram head US carotids We will call you with the results Please follow-up with Dr. Sarajane Jews or podiatry about your left foot pain

## 2016-12-18 ENCOUNTER — Telehealth: Payer: Self-pay | Admitting: Family Medicine

## 2016-12-18 NOTE — Telephone Encounter (Signed)
[facility] - Client [place] [facility] Patient Name: Kiara Ramirez DOB: 1951/03/04 Initial Comment Caller states she is taking medicine for high blood pressure and calling to find out what she can take for itchy, water eyes, runny/stuffy nose, cough, and is sneezing. Nurse Assessment Nurse: Joline Salt, RN, Malachy Mood Date/Time Eilene Ghazi Time): 12/18/2016 3:40:40 PM Confirm and document reason for call. If symptomatic, describe symptoms. ---Caller states she is taking medicine for high blood pressure and calling to find out what she can take for itchy, watery eyes, runny/ stuffy nose, cough, and is sneezing x 2 days. No fever. Does the patient have any new or worsening symptoms? ---Yes Will a triage be completed? ---Yes Related visit to physician within the last 2 weeks? ---No Does the PT have any chronic conditions? (i.e. diabetes, asthma, etc.) ---YesList chronic conditions. ---Hypertension takes HCTZ 25mg , metaprolol 50mg  BID, Potasium Chloride 10 Meq, Aspirin 81 mg. Is this a behavioral health or substance abuse call? ---No Guidelines Guideline Title Affirmed Question Affirmed Notes Common Cold Cold with no complications Final Disposition User Nardin, RN, Malachy Mood Disagree/Comply: Comply Call Id: [phone]

## 2016-12-19 ENCOUNTER — Encounter: Payer: Self-pay | Admitting: Family Medicine

## 2016-12-19 ENCOUNTER — Ambulatory Visit (INDEPENDENT_AMBULATORY_CARE_PROVIDER_SITE_OTHER): Payer: BC Managed Care – PPO | Admitting: Family Medicine

## 2016-12-19 VITALS — BP 118/80 | HR 74 | Temp 98.5°F | Ht 62.0 in | Wt 215.4 lb

## 2016-12-19 DIAGNOSIS — J329 Chronic sinusitis, unspecified: Secondary | ICD-10-CM | POA: Diagnosis not present

## 2016-12-19 DIAGNOSIS — E538 Deficiency of other specified B group vitamins: Secondary | ICD-10-CM

## 2016-12-19 DIAGNOSIS — J310 Chronic rhinitis: Secondary | ICD-10-CM

## 2016-12-19 MED ORDER — BENZONATATE 100 MG PO CAPS: 100.0000 mg | ORAL_CAPSULE | Freq: Three times a day (TID) | ORAL | 0 refills | Status: DC | PRN

## 2016-12-19 MED ORDER — CYANOCOBALAMIN 1000 MCG/ML IJ SOLN: 1000.0000 ug | Freq: Once | INTRAMUSCULAR | 0 refills | Status: DC

## 2016-12-19 MED ORDER — CYANOCOBALAMIN 1000 MCG/ML IJ SOLN
1000.0000 ug | Freq: Once | INTRAMUSCULAR | Status: AC
  Administered 2016-12-19: 1000 ug via INTRAMUSCULAR

## 2016-12-19 NOTE — Telephone Encounter (Signed)
Called Kiara Ramirez stated that she is coughing, sneezing but no temperature, Kiara Ramirez was willing to come to clinic and be seen, Kiara Ramirez has been scheduled to see Dr Maudie Mercury at 2pm.

## 2016-12-19 NOTE — Patient Instructions (Signed)
Start claritin once daily for 1 month  Can use the tessalon for cough as needed.  I hope you are feeling better soon! Follow up if worsening, new concerns or you are not improving with treatment.   INSTRUCTIONS FOR UPPER RESPIRATORY INFECTION:  -nasal saline wash 2-3 times daily (use prepackaged nasal saline or bottled/distilled water if making your own)   -can use tylenol (in no history of liver disease) or ibuprofen (if no history of kidney disease, bowel bleeding or significant heart disease) as directed for aches and sorethroat  -in the winter time, using a humidifier at night is helpful (please follow cleaning instructions)  -if you are taking a cough medication - use only as directed, may also try a teaspoon of honey to coat the throat and throat lozenges. I sent the tessalon to the pharmacy.  -for sore throat, salt water gargles can help  -follow up if you have fevers, facial pain, tooth pain, difficulty breathing or are worsening or symptoms persist longer then expected  Upper Respiratory Infection, Adult An upper respiratory infection (URI) is also known as the common cold. It is often caused by a type of germ (virus). Colds are easily spread (contagious). You can pass it to others by kissing, coughing, sneezing, or drinking out of the same glass. Usually, you get better in 1 to 3  weeks.  However, the cough can last for even longer. HOME CARE   Only take medicine as told by your doctor. Follow instructions provided above.  Drink enough water and fluids to keep your pee (urine) clear or pale yellow.  Get plenty of rest.  Return to work when your temperature is < 100 for 24 hours or as told by your doctor. You may use a face mask and wash your hands to stop your cold from spreading. GET HELP RIGHT AWAY IF:   After the first few days, you feel you are getting worse.  You have questions about your medicine.  You have chills, shortness of breath, or red spit  (mucus).  You have pain in the face for more then 1-2 days, especially when you bend forward.  You have a fever, puffy (swollen) neck, pain when you swallow, or white spots in the back of your throat.  You have a bad headache, ear pain, sinus pain, or chest pain.  You have a high-pitched whistling sound when you breathe in and out (wheezing).  You cough up blood.  You have sore muscles or a stiff neck. MAKE SURE YOU:   Understand these instructions.  Will watch your condition.  Will get help right away if you are not doing well or get worse. Document Released: 09/18/2007 Document Revised: 06/24/2011 Document Reviewed: 07/07/2013 [facility] Patient Information 2015 [place], [place]. This information is not intended to replace advice given to you by your health care provider. Make sure you discuss any questions you have with your health care provider.

## 2016-12-19 NOTE — Addendum Note (Signed)
Addended by: Agnes Lawrence on: 12/19/2016 03:02 PM   Modules accepted: Orders

## 2016-12-19 NOTE — Progress Notes (Signed)
HPI:  Acute visit for:  Nasal congestion -started: 2 days ago -symptoms:nasal congestion, sneezing, itchy eyes, sore throat, cough -denies:fever, SOB, NVD, tooth pain -has tried: nothing -sick contacts/travel/risks: no reported flu, strep or tick exposure  She wants her B12 shot today.  ROS: See pertinent positives and negatives per HPI.  Past Medical History:  Diagnosis Date  . Anxiety   . B12 deficiency   . Headache(784.0)   . Hypertension     Past Surgical History:  Procedure Laterality Date  . COLONOSCOPY  07/13/07   per Dr. Fuller Plan, repeat in 5 yrs   . TUBAL LIGATION      Family History  Problem Relation Age of Onset  . Heart failure Mother   . Anemia Father   . Breast cancer Sister   . Cancer Unknown        colon/endometrial  . Hypertension Unknown        family hx  . Arthritis Unknown        family hx  . Diabetes Unknown        family hx    Social History   Social History  . Marital status: Married    Spouse name: N/A  . Number of children: 2  . Years of education: 13   Occupational History  .  [employer]   Social History Main Topics  . Smoking status: Never Smoker  . Smokeless tobacco: Never Used  . Alcohol use No  . Drug use: No  . Sexual activity: Not Asked   Other Topics Concern  . None   Social History Narrative   Lives with husband in a one story home.  Has 2 children.     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     Education: one year of college.      Current Outpatient Prescriptions:  .  aspirin 81 MG tablet, Take 81 mg by mouth daily.  , Disp: , Rfl:  .  cyanocobalamin (,VITAMIN B-12,) 1000 MCG/ML injection, Inject 1,000 mcg into the muscle every 30 (thirty) days. Reported on 06/28/2015, Disp: , Rfl:  .  hydrochlorothiazide (HYDRODIURIL) 25 MG tablet, Take 1 tablet (25 mg total) by mouth daily., Disp: 90 tablet, Rfl: 3 .  metoprolol tartrate (LOPRESSOR) 50 MG tablet, Take 0.5 tablets (25 mg total) by mouth 2 (two) times  daily., Disp: 90 tablet, Rfl: 3 .  potassium chloride (KLOR-CON 10) 10 MEQ tablet, Take 2 tablets (20 mEq total) by mouth 2 (two) times daily., Disp: 180 tablet, Rfl: 3 .  benzonatate (TESSALON PERLES) 100 MG capsule, Take 1 capsule (100 mg total) by mouth 3 (three) times daily as needed., Disp: 20 capsule, Rfl: 0  EXAM:  Vitals:   12/19/16 1415  BP: 118/80  Pulse: 74  Temp: 98.5 F (36.9 C)  SpO2: 96%    Body mass index is 39.4 kg/m.  GENERAL: vitals reviewed and listed above, alert, oriented, appears well hydrated and in no acute distress  HEENT: atraumatic, conjunttiva clear, no obvious abnormalities on inspection of external nose and ears, normal appearance of ear canals and TMs, clear nasal congestion, mild post oropharyngeal erythema with PND, no tonsillar edema or exudate, no sinus TTP  NECK: no obvious masses on inspection  LUNGS: clear to auscultation bilaterally, no wheezes, rales or rhonchi, good air movement  CV: HRRR, no peripheral edema  MS: moves all extremities without noticeable abnormality  PSYCH: pleasant and cooperative, no obvious depression or anxiety  ASSESSMENT AND  PLAN:  Discussed the following assessment and plan:  Rhinosinusitis  B12 deficiency  -given HPI and exam findings today, a serious infection or illness is unlikely. We discussed potential etiologies, with allergic rhinitis or VURI being most likely, and advised supportive care and monitoring. We discussed treatment side effects, likely course, antibiotic misuse, transmission, and signs of developing a serious illness.opted to treat with antihistamine and a cough medication. -of course, we advised to return or notify a doctor immediately if symptoms worsen or persist or new concerns arise. B12 injection today and advised that she follow-up with her PCP for regular injections per prior recommendations.   Patient Instructions  Start claritin once daily for 1 month  Can use the tessalon  for cough as needed.  I hope you are feeling better soon! Follow up if worsening, new concerns or you are not improving with treatment.   INSTRUCTIONS FOR UPPER RESPIRATORY INFECTION:  -nasal saline wash 2-3 times daily (use prepackaged nasal saline or bottled/distilled water if making your own)   -can use tylenol (in no history of liver disease) or ibuprofen (if no history of kidney disease, bowel bleeding or significant heart disease) as directed for aches and sorethroat  -in the winter time, using a humidifier at night is helpful (please follow cleaning instructions)  -if you are taking a cough medication - use only as directed, may also try a teaspoon of honey to coat the throat and throat lozenges. I sent the tessalon to the pharmacy.  -for sore throat, salt water gargles can help  -follow up if you have fevers, facial pain, tooth pain, difficulty breathing or are worsening or symptoms persist longer then expected  Upper Respiratory Infection, Adult An upper respiratory infection (URI) is also known as the common cold. It is often caused by a type of germ (virus). Colds are easily spread (contagious). You can pass it to others by kissing, coughing, sneezing, or drinking out of the same glass. Usually, you get better in 1 to 3  weeks.  However, the cough can last for even longer. HOME CARE   Only take medicine as told by your doctor. Follow instructions provided above.  Drink enough water and fluids to keep your pee (urine) clear or pale yellow.  Get plenty of rest.  Return to work when your temperature is < 100 for 24 hours or as told by your doctor. You may use a face mask and wash your hands to stop your cold from spreading. GET HELP RIGHT AWAY IF:   After the first few days, you feel you are getting worse.  You have questions about your medicine.  You have chills, shortness of breath, or red spit (mucus).  You have pain in the face for more then 1-2 days, especially  when you bend forward.  You have a fever, puffy (swollen) neck, pain when you swallow, or white spots in the back of your throat.  You have a bad headache, ear pain, sinus pain, or chest pain.  You have a high-pitched whistling sound when you breathe in and out (wheezing).  You cough up blood.  You have sore muscles or a stiff neck. MAKE SURE YOU:   Understand these instructions.  Will watch your condition.  Will get help right away if you are not doing well or get worse. Document Released: 09/18/2007 Document Revised: 06/24/2011 Document Reviewed: 07/07/2013 [facility] Patient Information 2015 [place], [place]. This information is not intended to replace advice given to you by your health care  provider. Make sure you discuss any questions you have with your health care provider.    Colin Benton R., DO

## 2017-01-10 ENCOUNTER — Ambulatory Visit (INDEPENDENT_AMBULATORY_CARE_PROVIDER_SITE_OTHER): Payer: BC Managed Care – PPO | Admitting: Emergency Medicine

## 2017-01-10 DIAGNOSIS — E538 Deficiency of other specified B group vitamins: Secondary | ICD-10-CM

## 2017-01-10 MED ORDER — CYANOCOBALAMIN 1000 MCG/ML IJ SOLN
1000.0000 ug | Freq: Once | INTRAMUSCULAR | Status: AC
  Administered 2017-01-10: 1000 ug via INTRAMUSCULAR

## 2017-02-07 ENCOUNTER — Ambulatory Visit (INDEPENDENT_AMBULATORY_CARE_PROVIDER_SITE_OTHER): Payer: BC Managed Care – PPO

## 2017-02-07 DIAGNOSIS — E538 Deficiency of other specified B group vitamins: Secondary | ICD-10-CM | POA: Diagnosis not present

## 2017-02-07 MED ORDER — CYANOCOBALAMIN 1000 MCG/ML IJ SOLN
1000.0000 ug | Freq: Once | INTRAMUSCULAR | Status: AC
  Administered 2017-02-07: 1000 ug via INTRAMUSCULAR

## 2017-02-18 ENCOUNTER — Ambulatory Visit: Payer: BC Managed Care – PPO | Admitting: Family Medicine

## 2017-02-18 ENCOUNTER — Encounter: Payer: Self-pay | Admitting: Family Medicine

## 2017-02-18 VITALS — BP 120/78 | Temp 98.8°F | Ht 62.0 in | Wt 221.0 lb

## 2017-02-18 DIAGNOSIS — J018 Other acute sinusitis: Secondary | ICD-10-CM

## 2017-02-18 MED ORDER — AZITHROMYCIN 250 MG PO TABS: ORAL_TABLET | ORAL | 0 refills | Status: DC

## 2017-02-18 NOTE — Patient Instructions (Signed)
WE NOW OFFER   Kiara Ramirez's FAST TRACK!!!  SAME DAY Appointments for ACUTE CARE  Such as: Sprains, Injuries, cuts, abrasions, rashes, muscle pain, joint pain, back pain Colds, flu, sore throats, headache, allergies, cough, fever  Ear pain, sinus and eye infections Abdominal pain, nausea, vomiting, diarrhea, upset stomach Animal/insect bites  3 Easy Ways to Schedule: Walk-In Scheduling Call in scheduling Mychart Sign-up: [email or web address]         

## 2017-02-18 NOTE — Progress Notes (Signed)
   Subjective:    Patient ID: Nadene Rubins, female    DOB: October 30, 1950, 66 y.o.   MRN: [id number]  HPI Here for 4 days of sinus pressure, PND, ST, hoarseness and a dry cough. No fever. Drinking fluids.   Review of Systems  Constitutional: Negative.   HENT: Positive for congestion, postnasal drip, sinus pressure, sinus pain and sore throat.   Eyes: Negative.   Respiratory: Positive for cough.        Objective:   Physical Exam  Constitutional: She appears well-developed and well-nourished.  HENT:  Right Ear: External ear normal.  Left Ear: External ear normal.  Nose: Nose normal.  Mouth/Throat: Oropharynx is clear and moist.  Hoarse voice   Eyes: Conjunctivae are normal.  Neck: No thyromegaly present.  Pulmonary/Chest: Effort normal and breath sounds normal. No respiratory distress. She has no wheezes. She has no rales.  Lymphadenopathy:    She has no cervical adenopathy.          Assessment & Plan:  Sinusitis, treat with a Zpack.  Alysia Penna, MD

## 2017-03-25 ENCOUNTER — Other Ambulatory Visit: Payer: Self-pay | Admitting: Obstetrics and Gynecology

## 2017-04-03 ENCOUNTER — Ambulatory Visit (INDEPENDENT_AMBULATORY_CARE_PROVIDER_SITE_OTHER): Payer: BC Managed Care – PPO | Admitting: *Deleted

## 2017-04-03 DIAGNOSIS — E538 Deficiency of other specified B group vitamins: Secondary | ICD-10-CM | POA: Diagnosis not present

## 2017-04-03 MED ORDER — CYANOCOBALAMIN 1000 MCG/ML IJ SOLN
1000.0000 ug | Freq: Once | INTRAMUSCULAR | Status: AC
  Administered 2017-04-03: 1000 ug via INTRAMUSCULAR

## 2017-05-05 ENCOUNTER — Encounter: Payer: Self-pay | Admitting: Family Medicine

## 2017-05-05 ENCOUNTER — Ambulatory Visit: Payer: BC Managed Care – PPO | Admitting: Family Medicine

## 2017-05-05 VITALS — BP 102/70 | HR 76 | Temp 98.3°F | Wt 220.2 lb

## 2017-05-05 DIAGNOSIS — E538 Deficiency of other specified B group vitamins: Secondary | ICD-10-CM

## 2017-05-05 DIAGNOSIS — J018 Other acute sinusitis: Secondary | ICD-10-CM

## 2017-05-05 LAB — VITAMIN B12: Vitamin B-12: 674 pg/mL (ref 211–911)

## 2017-05-05 MED ORDER — CYANOCOBALAMIN 1000 MCG/ML IJ SOLN: 1000.0000 ug | INTRAMUSCULAR | 5 refills | Status: DC

## 2017-05-05 MED ORDER — AZITHROMYCIN 250 MG PO TABS: ORAL_TABLET | ORAL | 0 refills | Status: DC

## 2017-05-05 NOTE — Addendum Note (Signed)
Addended by: Myriam Forehand on: 05/05/2017 11:36 AM   Modules accepted: Orders

## 2017-05-05 NOTE — Progress Notes (Signed)
   Subjective:    Patient ID: Kiara Ramirez, female    DOB: 04-27-1950, 67 y.o.   MRN: [id number]  HPI Here for 3 days of sinus congestion, PND, and coughing up green sputum.    Review of Systems  Constitutional: Negative.   HENT: Positive for congestion, postnasal drip and sinus pressure. Negative for sinus pain and sore throat.   Eyes: Negative.   Respiratory: Positive for cough.        Objective:   Physical Exam  Constitutional: She appears well-developed and well-nourished.  HENT:  Right Ear: External ear normal.  Left Ear: External ear normal.  Nose: Nose normal.  Mouth/Throat: Oropharynx is clear and moist.  Eyes: Conjunctivae are normal.  Neck: No thyromegaly present.  Pulmonary/Chest: Effort normal and breath sounds normal. No respiratory distress. She has no wheezes. She has no rales.  Lymphadenopathy:    She has no cervical adenopathy.          Assessment & Plan:  Sinusitis, treat with a Zpack. Add Delsym prn.  Alysia Penna, MD

## 2017-06-02 ENCOUNTER — Other Ambulatory Visit: Payer: Self-pay | Admitting: Family Medicine

## 2017-06-02 NOTE — Telephone Encounter (Signed)
Copied from [place]. Topic: Inquiry >> Jun 02, 2017 12:52 PM Pricilla Handler wrote: Reason for CRM: Patient called requesting a refill of potassium chloride (KLOR-CON 10) 10 MEQ tablet. Patient's preferred pharmacy is [place], [place] - [address] [phone] (Phone) [phone] (Fax)       Thank You!!!

## 2017-06-03 ENCOUNTER — Telehealth: Payer: Self-pay

## 2017-06-03 NOTE — Telephone Encounter (Signed)
Last OV 05/05/2017  Last refilled 06/02/2017 disp 180 with 3 refills     Copied from [place]. Topic: Inquiry >> Jun 02, 2017 12:52 PM Pricilla Handler wrote: Reason for CRM: Patient called requesting a refill of potassium chloride (KLOR-CON 10) 10 MEQ tablet. Patient's preferred pharmacy is [place], [place] - [address] [phone] (Phone) [phone] (Fax)       Thank You!!!

## 2017-07-18 ENCOUNTER — Other Ambulatory Visit: Payer: Self-pay | Admitting: Family Medicine

## 2017-07-24 ENCOUNTER — Ambulatory Visit (INDEPENDENT_AMBULATORY_CARE_PROVIDER_SITE_OTHER): Payer: BC Managed Care – PPO | Admitting: Family Medicine

## 2017-07-24 ENCOUNTER — Telehealth: Payer: Self-pay | Admitting: Family Medicine

## 2017-07-24 DIAGNOSIS — E538 Deficiency of other specified B group vitamins: Secondary | ICD-10-CM

## 2017-07-24 MED ORDER — CYANOCOBALAMIN 1000 MCG/ML IJ SOLN
1000.0000 ug | Freq: Once | INTRAMUSCULAR | Status: AC
  Administered 2017-07-24: 1000 ug via INTRAMUSCULAR

## 2017-07-24 NOTE — Telephone Encounter (Signed)
Once a month

## 2017-07-24 NOTE — Telephone Encounter (Signed)
How often are Vitamin B 12 injections recommended? Patient says once a month, just need to confirm.

## 2017-07-24 NOTE — Progress Notes (Signed)
Per orders of Dr. Fry, injection of Vitamin B 12 given by Raeshaun Simson ANN. Patient tolerated injection well. 

## 2017-10-06 ENCOUNTER — Ambulatory Visit: Payer: BC Managed Care – PPO | Admitting: Family Medicine

## 2017-10-06 ENCOUNTER — Encounter: Payer: Self-pay | Admitting: Family Medicine

## 2017-10-06 VITALS — BP 114/76 | HR 73 | Temp 98.2°F | Ht 62.0 in | Wt 217.8 lb

## 2017-10-06 DIAGNOSIS — E538 Deficiency of other specified B group vitamins: Secondary | ICD-10-CM

## 2017-10-06 DIAGNOSIS — G8929 Other chronic pain: Secondary | ICD-10-CM | POA: Insufficient documentation

## 2017-10-06 DIAGNOSIS — M25562 Pain in left knee: Secondary | ICD-10-CM | POA: Diagnosis not present

## 2017-10-06 MED ORDER — CYANOCOBALAMIN 1000 MCG/ML IJ SOLN
1000.0000 ug | Freq: Once | INTRAMUSCULAR | Status: AC
  Administered 2017-10-06: 1000 ug via INTRAMUSCULAR

## 2017-10-06 MED ORDER — DICLOFENAC SODIUM 75 MG PO TBEC: 75.0000 mg | DELAYED_RELEASE_TABLET | Freq: Two times a day (BID) | ORAL | 2 refills | Status: DC

## 2017-10-06 NOTE — Addendum Note (Signed)
Addended by: Myriam Forehand on: 10/06/2017 03:08 PM   Modules accepted: Orders

## 2017-10-06 NOTE — Progress Notes (Signed)
   Subjective:    Patient ID: Kiara Ramirez, female    DOB: 10-Jul-1950, 67 y.o.   MRN: [id number]  HPI Here for worsening left knee pain. Sometimes it almost buckles beneath her. It has been bothering her for years but is steadily getting worse. Ibuprofen and ice help.    Review of Systems  Constitutional: Negative.   Cardiovascular: Negative.   Musculoskeletal: Positive for arthralgias and gait problem.       Objective:   Physical Exam  Constitutional: She appears well-developed and well-nourished.  In pain, limping   Cardiovascular: Normal rate, regular rhythm, normal heart sounds and intact distal pulses.  Pulmonary/Chest: Effort normal and breath sounds normal.  Musculoskeletal:  Left knee has full ROM but a lot of crepitus is present. She is very tender over both joint spaces           Assessment & Plan:  Severe degenerative arthritis in the knee. Try  Diclofenac for pain. Use a cane for support. We will refer her to Orthopedics. Alysia Penna, MD

## 2017-10-15 ENCOUNTER — Ambulatory Visit (INDEPENDENT_AMBULATORY_CARE_PROVIDER_SITE_OTHER): Payer: BC Managed Care – PPO

## 2017-10-15 ENCOUNTER — Encounter (INDEPENDENT_AMBULATORY_CARE_PROVIDER_SITE_OTHER): Payer: Self-pay | Admitting: Orthopedic Surgery

## 2017-10-15 ENCOUNTER — Telehealth (INDEPENDENT_AMBULATORY_CARE_PROVIDER_SITE_OTHER): Payer: Self-pay

## 2017-10-15 ENCOUNTER — Ambulatory Visit (INDEPENDENT_AMBULATORY_CARE_PROVIDER_SITE_OTHER): Payer: BC Managed Care – PPO | Admitting: Orthopedic Surgery

## 2017-10-15 DIAGNOSIS — G8929 Other chronic pain: Secondary | ICD-10-CM | POA: Diagnosis not present

## 2017-10-15 DIAGNOSIS — M1712 Unilateral primary osteoarthritis, left knee: Secondary | ICD-10-CM | POA: Diagnosis not present

## 2017-10-15 DIAGNOSIS — M25562 Pain in left knee: Secondary | ICD-10-CM

## 2017-10-15 NOTE — Telephone Encounter (Signed)
Can we please get patient approved for left knee gel injection? Thanks.  °

## 2017-10-15 NOTE — Telephone Encounter (Signed)
Noted  

## 2017-10-17 DIAGNOSIS — M1712 Unilateral primary osteoarthritis, left knee: Secondary | ICD-10-CM

## 2017-10-17 MED ORDER — METHYLPREDNISOLONE ACETATE 40 MG/ML IJ SUSP
40.0000 mg | INTRAMUSCULAR | Status: AC | PRN
  Administered 2017-10-17: 40 mg via INTRA_ARTICULAR

## 2017-10-17 MED ORDER — BUPIVACAINE HCL 0.25 % IJ SOLN
4.0000 mL | INTRAMUSCULAR | Status: AC | PRN
  Administered 2017-10-17: 4 mL via INTRA_ARTICULAR

## 2017-10-17 MED ORDER — LIDOCAINE HCL 1 % IJ SOLN
5.0000 mL | INTRAMUSCULAR | Status: AC | PRN
  Administered 2017-10-17: 5 mL

## 2017-10-17 NOTE — Progress Notes (Signed)
Office Visit Note   Patient: Kiara Ramirez           Date of Birth: [birth year]           MRN: [id number] Visit Date: 10/15/2017 Requested by: Laurey Morale, MD [place],  [id number] PCP: Laurey Morale, MD  Subjective: Chief Complaint  Patient presents with  . Left Knee - Pain    HPI: Patient presents with chronic left knee pain.  The patient describes pain coming and going for years but noticed it is getting worse as she is getting older.  She describes swelling and weakness stiffness and popping but no discrete locking.  Mobic was prescribed by her primary care provider that helped a lot.  She had one injection 15 years ago which also helped.  She denies any back pain radicular symptoms or groin pain on the left-hand side              ROS: All systems reviewed are negative as they relate to the chief complaint within the history of present illness.  Patient denies  fevers or chills.   Assessment & Plan: Visit Diagnoses:  1. Chronic pain of left knee     Plan: Impression is left knee arthritis primarily medial and patellofemoral compartment.  Plan is cortisone injection today with non-loadbearing quad strengthening exercises.  Preapproved for gel injection for when the cortisone wears off.  I will see her back in the cortisone injection wears off and we will do a gel injection.  She will likely need knee replacement in the future but at a time of her choosing with her symptoms are worse  Follow-Up Instructions: Return if symptoms worsen or fail to improve.   Orders:  Orders Placed This Encounter  Procedures  . XR KNEE 3 VIEW LEFT   No orders of the defined types were placed in this encounter.     Procedures: Large Joint Inj: L knee on 10/17/2017 10:51 PM Indications: diagnostic evaluation, joint swelling and pain Details: 18 G 1.5 in needle, superolateral approach  Arthrogram: No  Medications: 5 mL lidocaine 1 %; 40 mg methylPREDNISolone acetate 40  MG/ML; 4 mL bupivacaine 0.25 % Outcome: tolerated well, no immediate complications Procedure, treatment alternatives, risks and benefits explained, specific risks discussed. Consent was given by the patient. Immediately prior to procedure a time out was called to verify the correct patient, procedure, equipment, support staff and site/side marked as required. Patient was prepped and draped in the usual sterile fashion.       Clinical Data: No additional findings.  Objective: Vital Signs: There were no vitals taken for this visit.  Physical Exam:   Constitutional: Patient appears well-developed HEENT:  Head: Normocephalic Eyes:EOM are normal Neck: Normal range of motion Cardiovascular: Normal rate Pulmonary/chest: Effort normal Neurologic: Patient is alert Skin: Skin is warm Psychiatric: Patient has normal mood and affect    Ortho Exam: Ortho exam demonstrates varus alignment bilateral lower extremities with palpable pedal pulses.  Range of motion pretty reasonable in both knees achieving near full extension and flexion easily past 90.  Extensor mechanism intact on the left.  No left knee effusion is present.  Medial greater than lateral joint line tenderness is present.  Not much in way of muscle atrophy left leg versus right  Specialty Comments:  No specialty comments available.  Imaging: No results found.   PMFS History: Patient Active Problem List   Diagnosis Date Noted  . Chronic pain of left  knee 10/06/2017  . HYPERTROPHY OF TONSILS ALONE 03/07/2008  . ANXIETY 04/20/2007  . Weakness generalized 04/01/2007  . B12 deficiency 02/25/2007  . Essential hypertension 01/23/2007  . HEADACHE 01/23/2007   Past Medical History:  Diagnosis Date  . Anxiety   . B12 deficiency   . Headache(784.0)   . Hypertension     Family History  Problem Relation Age of Onset  . Heart failure Mother   . Anemia Father   . Breast cancer Sister   . Cancer Unknown         colon/endometrial  . Hypertension Unknown        family hx  . Arthritis Unknown        family hx  . Diabetes Unknown        family hx    Past Surgical History:  Procedure Laterality Date  . COLONOSCOPY  07/13/07   per Dr. Fuller Plan, repeat in 5 yrs   . TUBAL LIGATION     Social History   Occupational History    Employer: [employer]  Tobacco Use  . Smoking status: Never Smoker  . Smokeless tobacco: Never Used  Substance and Sexual Activity  . Alcohol use: No    Alcohol/week: 0.0 oz  . Drug use: No  . Sexual activity: Not on file

## 2017-10-20 ENCOUNTER — Telehealth (INDEPENDENT_AMBULATORY_CARE_PROVIDER_SITE_OTHER): Payer: Self-pay

## 2017-10-20 NOTE — Telephone Encounter (Signed)
Submitted application online for SynviscOne, left knee.

## 2017-10-24 ENCOUNTER — Telehealth (INDEPENDENT_AMBULATORY_CARE_PROVIDER_SITE_OTHER): Payer: Self-pay

## 2017-10-24 NOTE — Telephone Encounter (Signed)
PA required.  Faxed PA form to [place] at [phone].

## 2017-11-07 ENCOUNTER — Ambulatory Visit (INDEPENDENT_AMBULATORY_CARE_PROVIDER_SITE_OTHER): Payer: BC Managed Care – PPO

## 2017-11-07 DIAGNOSIS — E538 Deficiency of other specified B group vitamins: Secondary | ICD-10-CM | POA: Diagnosis not present

## 2017-11-07 MED ORDER — CYANOCOBALAMIN 1000 MCG/ML IJ SOLN
1000.0000 ug | Freq: Once | INTRAMUSCULAR | Status: AC
  Administered 2017-11-07: 1000 ug via INTRAMUSCULAR

## 2017-11-07 NOTE — Progress Notes (Signed)
Per orders of Dr. Sarajane Jews, injection of B12 given by Gwenyth Ober Patient tolerated injection well.

## 2017-11-11 ENCOUNTER — Telehealth (INDEPENDENT_AMBULATORY_CARE_PROVIDER_SITE_OTHER): Payer: Self-pay

## 2017-11-11 NOTE — Telephone Encounter (Signed)
Patient approved for SynviscOne, left knee. Buy & Bill Covered at 100% after $80.00 co-pay PA Approval# is [id number] Valid 10/24/2017-10/24/2018

## 2017-11-11 NOTE — Telephone Encounter (Signed)
Called and left VM for patient to CB to schedule appt. For SynviscOne, left knee with Dr. Marlou Sa.

## 2017-12-13 ENCOUNTER — Ambulatory Visit: Payer: BC Managed Care – PPO | Admitting: Family Medicine

## 2017-12-13 ENCOUNTER — Encounter: Payer: Self-pay | Admitting: Family Medicine

## 2017-12-13 VITALS — BP 128/68 | HR 74 | Temp 98.2°F | Ht 62.0 in | Wt 221.0 lb

## 2017-12-13 DIAGNOSIS — M79671 Pain in right foot: Secondary | ICD-10-CM | POA: Diagnosis not present

## 2017-12-13 NOTE — Progress Notes (Signed)
Subjective:    Patient ID: Kiara Ramirez, female    DOB: [birth year], 67 y.o.   MRN: [id number]  Chief Complaint  Patient presents with  . Foot Pain    HPI Patient was seen today for acute issue.  Pt notes waking up this am with pain in the side of her R foot. Pt denies injury to foot, redness or swelling.  Pt states she noticed last night when waling to the bathroom.  She is continuing to have pain.  She has not taken anything for her symptoms.  Denies changes in shoes, but remembers she washed the pair of shoes she wore to work yesterday.  She recalls them being tight so she took them off at one point in the day.  Pt states her mother had gout, but pt does not have a personal h/o it.  Pt does eat liver weekly. She denies EtOH use.  Past Medical History:  Diagnosis Date  . Anxiety   . B12 deficiency   . Headache(784.0)   . Hypertension     Allergies  Allergen Reactions  . Oxycodone Other (See Comments)  . Ace Inhibitors   . Hydrocodone-Acetaminophen Nausea Only    ROS General: Denies fever, chills, night sweats, changes in weight, changes in appetite HEENT: Denies headaches, ear pain, changes in vision, rhinorrhea, sore throat CV: Denies CP, palpitations, SOB, orthopnea Pulm: Denies SOB, cough, wheezing GI: Denies abdominal pain, nausea, vomiting, diarrhea, constipation GU: Denies dysuria, hematuria, frequency, vaginal discharge Msk: Denies muscle cramps, joint pains  +R foot pain Neuro: Denies weakness, numbness, tingling Skin: Denies rashes, bruising Psych: Denies depression, anxiety, hallucinations     Objective:    Blood pressure 128/68, pulse 74, temperature 98.2 F (36.8 C), temperature source Oral, height 5\' 2"  (1.575 m), weight 221 lb (100.2 kg), SpO2 97 %.   Gen. Pleasant, well-nourished, in no distress, normal affect  HEENT: [place]/AT, face symmetric, conjunctiva clear, no scleral icterus, PERRLA, Lungs: no accessory muscle use, CTAB, no wheezes or  rales Cardiovascular: RRR, no m/r/g, no peripheral edema Musculoskeletal: Normal DP and PT pulses b/l.  No erythema, edema, deformities, or TTP of b/l feet of ankles.  Normal ROM of b/l feet.  Mild discomfort of R lateral foot with plantar flexion of R foot.  No deformities, no cyanosis or clubbing, normal tone Neuro:  A&Ox3, CN II-XII intact, walking with a limp   Wt Readings from Last 3 Encounters:  12/13/17 221 lb (100.2 kg)  10/06/17 217 lb 12.8 oz (98.8 kg)  05/05/17 220 lb 3.2 oz (99.9 kg)    Lab Results  Component Value Date   WBC 7.9 09/06/2016   HGB 12.5 09/06/2016   HCT 37.3 09/06/2016   PLT 299 09/06/2016   GLUCOSE 86 09/24/2016   CHOL 118 06/24/2016   TRIG 77.0 06/24/2016   HDL 31.00 (L) 06/24/2016   LDLCALC 72 06/24/2016   ALT 11 (L) 09/05/2016   AST 15 09/05/2016   NA 140 09/24/2016   K 4.0 09/24/2016   CL 103 09/24/2016   CREATININE 0.82 09/24/2016   BUN 19 09/24/2016   CO2 31 09/24/2016   TSH 1.08 06/24/2016   HGBA1C 5.9 02/24/2007    Assessment/Plan:  Right foot pain  -discussed possible causes including muscle strain, gout, fx (less likely) -likely pain 2/2 wearing tight shoes the day before. -discussed taking tylenol for discomfort.  Ok to use ice, and elevate foot. -advised to rest, however pt going to [employer] football game this evening. -also encouraged  to limit intake of organ meats which can cause increased purine levels  F/u prn with pcp  Grier Mitts, MD

## 2017-12-18 ENCOUNTER — Other Ambulatory Visit: Payer: Self-pay | Admitting: Family Medicine

## 2017-12-19 ENCOUNTER — Telehealth: Payer: Self-pay | Admitting: *Deleted

## 2017-12-19 ENCOUNTER — Ambulatory Visit (INDEPENDENT_AMBULATORY_CARE_PROVIDER_SITE_OTHER): Payer: BC Managed Care – PPO | Admitting: *Deleted

## 2017-12-19 DIAGNOSIS — E538 Deficiency of other specified B group vitamins: Secondary | ICD-10-CM

## 2017-12-19 MED ORDER — CYANOCOBALAMIN 1000 MCG/ML IJ SOLN
1000.0000 ug | Freq: Once | INTRAMUSCULAR | Status: AC
  Administered 2017-12-19: 1000 ug via INTRAMUSCULAR

## 2017-12-19 NOTE — Telephone Encounter (Signed)
Dr. Sarajane Jews,  Pt last had vitamin b 12 checked 04/2017 and need to see when she will need to have these labs done again.  Should we continue with monthly vitamin b 12 injections?  Thanks

## 2017-12-19 NOTE — Progress Notes (Signed)
Per Dr. Sarajane Jews, ordered vitamin b 12 injection.  Given by Elita Boone, CMA and patient tolerated injection well.

## 2017-12-22 NOTE — Telephone Encounter (Signed)
Noted  

## 2017-12-22 NOTE — Telephone Encounter (Signed)
Yes she will continue on the monthly B12 shots. We can check a level once a year (January)

## 2018-02-20 ENCOUNTER — Encounter: Payer: Self-pay | Admitting: Family Medicine

## 2018-02-20 ENCOUNTER — Ambulatory Visit: Payer: BC Managed Care – PPO | Admitting: Family Medicine

## 2018-02-20 VITALS — BP 128/88 | HR 58 | Temp 97.8°F | Wt 220.0 lb

## 2018-02-20 DIAGNOSIS — J019 Acute sinusitis, unspecified: Secondary | ICD-10-CM | POA: Diagnosis not present

## 2018-02-20 DIAGNOSIS — E538 Deficiency of other specified B group vitamins: Secondary | ICD-10-CM | POA: Diagnosis not present

## 2018-02-20 MED ORDER — CYANOCOBALAMIN 1000 MCG/ML IJ SOLN
1000.0000 ug | Freq: Once | INTRAMUSCULAR | Status: AC
  Administered 2018-02-20: 1000 ug via INTRAMUSCULAR

## 2018-02-20 MED ORDER — AZITHROMYCIN 250 MG PO TABS: ORAL_TABLET | ORAL | 0 refills | Status: DC

## 2018-02-20 NOTE — Progress Notes (Signed)
   Subjective:    Patient ID: Kiara Ramirez, female    DOB: [birth year], 67 y.o.   MRN: [id number]  HPI Here for 6 days of sinus pressure, PND, and a dry cough. No fever.    Review of Systems  Constitutional: Negative.   HENT: Positive for congestion, postnasal drip, sinus pressure and sinus pain. Negative for sore throat.   Eyes: Negative.   Respiratory: Positive for cough.        Objective:   Physical Exam  Constitutional: She appears well-developed and well-nourished.  HENT:  Right Ear: External ear normal.  Left Ear: External ear normal.  Nose: Nose normal.  Mouth/Throat: Oropharynx is clear and moist.  Eyes: Conjunctivae are normal.  Neck: No thyromegaly present.  Pulmonary/Chest: Effort normal and breath sounds normal.  Lymphadenopathy:    She has no cervical adenopathy.          Assessment & Plan:  Sinusitis, treat with a Zpack and Mucinex. Alysia Penna, MD

## 2018-04-04 ENCOUNTER — Encounter: Payer: Self-pay | Admitting: Family Medicine

## 2018-04-04 ENCOUNTER — Ambulatory Visit: Payer: BC Managed Care – PPO | Admitting: Family Medicine

## 2018-04-04 VITALS — BP 140/90 | HR 74 | Resp 16

## 2018-04-04 DIAGNOSIS — M79674 Pain in right toe(s): Secondary | ICD-10-CM

## 2018-04-04 MED ORDER — NAPROXEN 500 MG PO TABS: 500.0000 mg | ORAL_TABLET | Freq: Two times a day (BID) | ORAL | 1 refills | Status: DC

## 2018-04-04 NOTE — Progress Notes (Signed)
Dorleen Kissel - 67 y.o. female MRN [id number]  Date of birth: [birth year]  SUBJECTIVE:  Including CC & ROS.  No chief complaint on file.   Amaal Dimartino is a 67 y.o. female that is  Presenting with right great toe pain. The pain started a day or two ago. The pain is severe, red, hot and swollen. She has pain if anything touches it. She has had similar pain in the past that resolved on its own. She denies any inciting event or trauma. Reports no history of gout. The pain is sharp and severe. Localized to the great toe.    Review of Systems  Constitutional: Negative for fever.  HENT: Negative for congestion.   Respiratory: Negative for cough.   Cardiovascular: Negative for chest pain.  Gastrointestinal: Negative for abdominal pain.  Musculoskeletal: Positive for gait problem and joint swelling.  Skin: Positive for color change.  Neurological: Negative for weakness.  Hematological: Negative for adenopathy.  Psychiatric/Behavioral: Negative for agitation.    HISTORY: Past Medical, Surgical, Social, and Family History Reviewed & Updated per EMR.   Pertinent Historical Findings include:  Past Medical History:  Diagnosis Date  . Anxiety   . B12 deficiency   . Headache(784.0)   . Hypertension     Past Surgical History:  Procedure Laterality Date  . COLONOSCOPY  07/13/07   per Dr. Fuller Plan, repeat in 5 yrs   . TUBAL LIGATION      Allergies  Allergen Reactions  . Oxycodone Other (See Comments)  . Ace Inhibitors   . Hydrocodone-Acetaminophen Nausea Only    Family History  Problem Relation Age of Onset  . Heart failure Mother   . Anemia Father   . Breast cancer Sister   . Cancer Unknown        colon/endometrial  . Hypertension Unknown        family hx  . Arthritis Unknown        family hx  . Diabetes Unknown        family hx     Social History   Socioeconomic History  . Marital status: Married    Spouse name: Not on file  . Number of children: 2  . Years of education:  70  . Highest education level: Not on file  Occupational History    Employer: [employer]  Social Needs  . Financial resource strain: Not on file  . Food insecurity:    Worry: Not on file    Inability: Not on file  . Transportation needs:    Medical: Not on file    Non-medical: Not on file  Tobacco Use  . Smoking status: Never Smoker  . Smokeless tobacco: Never Used  Substance and Sexual Activity  . Alcohol use: No    Alcohol/week: 0.0 standard drinks  . Drug use: No  . Sexual activity: Not on file  Lifestyle  . Physical activity:    Days per week: Not on file    Minutes per session: Not on file  . Stress: Not on file  Relationships  . Social connections:    Talks on phone: Not on file    Gets together: Not on file    Attends religious service: Not on file    Active member of club or organization: Not on file    Attends meetings of clubs or organizations: Not on file    Relationship status: Not on file  . Intimate partner violence:    Fear of current or ex partner: Not on  file    Emotionally abused: Not on file    Physically abused: Not on file    Forced sexual activity: Not on file  Other Topics Concern  . Not on file  Social History Narrative   Lives with husband in a one story home.  Has 2 children.     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     Education: one year of college.      PHYSICAL EXAM:  VS: BP 140/90   Pulse 74   Resp 16   SpO2 96%  Physical Exam Gen: NAD, alert, cooperative with exam, well-appearing ENT: normal lips, normal nasal mucosa,  Eye: normal EOM, normal conjunctiva and lids CV:  no edema, +2 pedal pulses   Resp: no accessory muscle use, non-labored,   Skin: no rashes, no areas of induration  Neuro: normal tone, normal sensation to touch Psych:  normal insight, alert and oriented MSK:  Right great toe:  Redness and swelling on the dorsal aspect  TTP over this area  Limited flexion and extension secondary to pain  No streaking    Neurovascularly intact   Limited ultrasound: right great toe:  Effusion with crystals in the joint.   Summary: findings suggestive of gout   Ultrasound and interpretation by Clearance Coots, MD      Aspiration/Injection Procedure Note Aerielle Stoklosa 10-27-1950  Procedure: Injection Indications: right great toe pain   Procedure Details Consent: Risks of procedure as well as the alternatives and risks of each were explained to the (patient/caregiver).  Consent for procedure obtained. Time Out: Verified patient identification, verified procedure, site/side was marked, verified correct patient position, special equipment/implants available, medications/allergies/relevent history reviewed, required imaging and test results available.  Performed.  The area was cleaned with iodine and alcohol swabs.    The right great toe was injected using 1 cc's of 40 mg kenalog and 1 cc's of 0.5% bupivacine with a 25 1" needle.  Ultrasound was used. Images were obtained in trans views showing the injection.     A sterile dressing was applied.  Patient did tolerate procedure well.      ASSESSMENT & PLAN:   Pain of right great toe Pain likely related to gout with crystals observed on [place] today  - injection today  - naproxen if needed  - should follow up to have uric acid checked  - counseled on supportive care

## 2018-04-04 NOTE — Patient Instructions (Signed)
Nice to meet  Please take the naproxen if you don't have resolution of your pain. Please take it until you are two days of being pain free  Please ice the area  Please follow up if your pain doesn't improve  Happy Holidays!

## 2018-04-06 DIAGNOSIS — M79674 Pain in right toe(s): Principal | ICD-10-CM

## 2018-04-06 NOTE — Assessment & Plan Note (Signed)
Pain likely related to gout with crystals observed on [place] today  - injection today  - naproxen if needed  - should follow up to have uric acid checked  - counseled on supportive care

## 2018-04-13 ENCOUNTER — Ambulatory Visit: Payer: BC Managed Care – PPO | Admitting: Family Medicine

## 2018-04-13 ENCOUNTER — Encounter: Payer: Self-pay | Admitting: Family Medicine

## 2018-04-13 VITALS — BP 122/82 | HR 62 | Temp 98.1°F | Ht 61.5 in | Wt 217.1 lb

## 2018-04-13 DIAGNOSIS — Z23 Encounter for immunization: Secondary | ICD-10-CM | POA: Diagnosis not present

## 2018-04-13 DIAGNOSIS — E538 Deficiency of other specified B group vitamins: Secondary | ICD-10-CM

## 2018-04-13 DIAGNOSIS — Z0000 Encounter for general adult medical examination without abnormal findings: Secondary | ICD-10-CM

## 2018-04-13 LAB — POC URINALSYSI DIPSTICK (AUTOMATED)
Bilirubin, UA: NEGATIVE
Blood, UA: POSITIVE
Glucose, UA: NEGATIVE
Ketones, UA: NEGATIVE
LEUKOCYTES UA: NEGATIVE
NITRITE UA: NEGATIVE
PROTEIN UA: POSITIVE — AB
SPEC GRAV UA: 1.025 (ref 1.010–1.025)
UROBILINOGEN UA: 0.2 U/dL
pH, UA: 5.5 (ref 5.0–8.0)

## 2018-04-13 LAB — LIPID PANEL
CHOLESTEROL: 128 mg/dL (ref 0–200)
HDL: 42.4 mg/dL (ref >39.00)
LDL CALC: 75 mg/dL (ref 0–99)
NonHDL: 85.99
TRIGLYCERIDES: 57 mg/dL (ref 0.0–149.0)
Total CHOL/HDL Ratio: 3
VLDL: 11.4 mg/dL (ref 0.0–40.0)

## 2018-04-13 LAB — HEPATIC FUNCTION PANEL
ALT: 9 U/L (ref 0–35)
AST: 10 U/L (ref 0–37)
Albumin: 3.5 g/dL (ref 3.5–5.2)
Alkaline Phosphatase: 93 U/L (ref 39–117)
BILIRUBIN DIRECT: 0.1 mg/dL (ref 0.0–0.3)
BILIRUBIN TOTAL: 0.4 mg/dL (ref 0.2–1.2)
Total Protein: 7.1 g/dL (ref 6.0–8.3)

## 2018-04-13 LAB — CBC WITH DIFFERENTIAL/PLATELET
BASOS ABS: 0 10*3/uL (ref 0.0–0.1)
Basophils Relative: 0.4 % (ref 0.0–3.0)
Eosinophils Absolute: 0 10*3/uL (ref 0.0–0.7)
Eosinophils Relative: 0.6 % (ref 0.0–5.0)
HCT: 38.5 % (ref 36.0–46.0)
Hemoglobin: 12.8 g/dL (ref 12.0–15.0)
Lymphocytes Relative: 23.7 % (ref 12.0–46.0)
Lymphs Abs: 1.8 10*3/uL (ref 0.7–4.0)
MCHC: 33.3 g/dL (ref 30.0–36.0)
MCV: 88.4 fl (ref 78.0–100.0)
Monocytes Absolute: 0.4 10*3/uL (ref 0.1–1.0)
Monocytes Relative: 5.5 % (ref 3.0–12.0)
Neutro Abs: 5.3 10*3/uL (ref 1.4–7.7)
Neutrophils Relative %: 69.8 % (ref 43.0–77.0)
Platelets: 369 10*3/uL (ref 150.0–400.0)
RBC: 4.35 Mil/uL (ref 3.87–5.11)
RDW: 14.3 % (ref 11.5–15.5)
WBC: 7.6 10*3/uL (ref 4.0–10.5)

## 2018-04-13 LAB — BASIC METABOLIC PANEL
BUN: 14 mg/dL (ref 6–23)
CALCIUM: 8.7 mg/dL (ref 8.4–10.5)
CHLORIDE: 99 meq/L (ref 96–112)
CO2: 34 mEq/L — ABNORMAL HIGH (ref 19–32)
CREATININE: 0.93 mg/dL (ref 0.40–1.20)
GFR: 77.47 mL/min (ref >60.00)
GLUCOSE: 82 mg/dL (ref 70–99)
POTASSIUM: 3.9 meq/L (ref 3.5–5.1)
Sodium: 140 mEq/L (ref 135–145)

## 2018-04-13 LAB — TSH: TSH: 0.96 u[IU]/mL (ref 0.35–4.50)

## 2018-04-13 LAB — VITAMIN B12: Vitamin B-12: 536 pg/mL (ref 211–911)

## 2018-04-13 MED ORDER — METOPROLOL TARTRATE 50 MG PO TABS: 25.0000 mg | ORAL_TABLET | Freq: Two times a day (BID) | ORAL | 3 refills | Status: DC

## 2018-04-13 NOTE — Progress Notes (Signed)
   Subjective:    Patient ID: Kiara Ramirez, female    DOB: 01-27-1951, 67 y.o.   MRN: [id number]  HPI Here for a well exam. She is doing well but does mention some intermittent numbness and tingling in the hands. Her BP is stable. She is getting B12 shots monthly.    Review of Systems  Constitutional: Negative.   HENT: Negative.   Eyes: Negative.   Respiratory: Negative.   Cardiovascular: Negative.   Gastrointestinal: Negative.   Genitourinary: Negative for decreased urine volume, difficulty urinating, dyspareunia, dysuria, enuresis, flank pain, frequency, hematuria, pelvic pain and urgency.  Musculoskeletal: Negative.   Skin: Negative.   Neurological: Positive for numbness.  Psychiatric/Behavioral: Negative.        Objective:   Physical Exam Constitutional:      General: She is not in acute distress.    Appearance: She is well-developed.  HENT:     Head: Normocephalic and atraumatic.     Right Ear: External ear normal.     Left Ear: External ear normal.     Nose: Nose normal.     Mouth/Throat:     Pharynx: No oropharyngeal exudate.  Eyes:     General: No scleral icterus.    Conjunctiva/sclera: Conjunctivae normal.     Pupils: Pupils are equal, round, and reactive to light.  Neck:     Musculoskeletal: Normal range of motion and neck supple.     Thyroid: No thyromegaly.     Vascular: No JVD.  Cardiovascular:     Rate and Rhythm: Normal rate and regular rhythm.     Heart sounds: Normal heart sounds. No murmur. No friction rub. No gallop.   Pulmonary:     Effort: Pulmonary effort is normal. No respiratory distress.     Breath sounds: Normal breath sounds. No wheezing or rales.  Chest:     Chest wall: No tenderness.  Abdominal:     General: Bowel sounds are normal. There is no distension.     Palpations: Abdomen is soft. There is no mass.     Tenderness: There is no abdominal tenderness. There is no guarding or rebound.  Musculoskeletal: Normal range of motion.      General: No tenderness.  Lymphadenopathy:     Cervical: No cervical adenopathy.  Skin:    General: Skin is warm and dry.     Findings: No erythema or rash.  Neurological:     Mental Status: She is alert and oriented to person, place, and time.     Cranial Nerves: No cranial nerve deficit.     Motor: No abnormal muscle tone.     Coordination: Coordination normal.     Deep Tendon Reflexes: Reflexes are normal and symmetric. Reflexes normal.  Psychiatric:        Behavior: Behavior normal.        Thought Content: Thought content normal.        Judgment: Judgment normal.           Assessment & Plan:  Well exam. We discussed diet and exercise. Get fasting labs. Check a B12 level. She is past due for a colonoscopy.  Alysia Penna, MD

## 2018-04-16 ENCOUNTER — Telehealth: Payer: Self-pay | Admitting: Family Medicine

## 2018-04-16 NOTE — Telephone Encounter (Signed)
Pt is aware of lab results per Dr. Sarajane Jews.  She wanted to verify if she needs to continue with the B12 injections or should she hold off on these for now.  Dr. Sarajane Jews please advise.  She is aware that Dr. Sarajane Jews will not be back until in the morning.

## 2018-04-17 NOTE — Telephone Encounter (Signed)
Yes continue with the B12 shots on her current schedule

## 2018-04-17 NOTE — Telephone Encounter (Signed)
Called and spoke with pt and she is aware of Dr. Barbie Banner recs to cont the b12 injections.

## 2018-04-24 ENCOUNTER — Other Ambulatory Visit: Payer: Self-pay | Admitting: Obstetrics and Gynecology

## 2018-04-27 ENCOUNTER — Other Ambulatory Visit: Payer: Self-pay | Admitting: Obstetrics and Gynecology

## 2018-04-27 NOTE — Patient Instructions (Addendum)
Your procedure is scheduled on: Thursday, 04/30/2018  Enter through the Main Entrance of [facility] at: 7:30 am  Pick up the phone at the desk and dial [phone].  Call this number if you have problems the morning of surgery: [phone].  Remember: Do NOT eat food or Do NOT drink clear liquids (including water) after midnight Wednesday.  Take these medicines the morning of surgery with a SIP OF WATER: metoprolol and potassium chloride  Brush your teeth on the day of surgery.  Stop herbal medications, vitamin supplements, Ibuprofen/NSAIDS at this time.  May take Tylenol.  Do NOT wear jewelry (body piercing), metal hair clips/bobby pins, make-up, or nail polish. Do NOT wear lotions, powders, or perfumes.  You may wear deoderant. Do NOT shave for 48 hours prior to surgery. Do NOT bring valuables to the hospital.  Leave suitcase in car.  After surgery it may be brought to your room.  For patients admitted to the hospital, checkout time is 11:00 AM the day of discharge. Have a responsible adult drive you home and stay with you for 24 hours after your procedure. Home with Husband Kiara Ramirez cell [phone].

## 2018-04-28 ENCOUNTER — Other Ambulatory Visit: Payer: Self-pay | Admitting: Obstetrics and Gynecology

## 2018-04-29 ENCOUNTER — Other Ambulatory Visit: Payer: Self-pay

## 2018-04-29 ENCOUNTER — Encounter (HOSPITAL_COMMUNITY): Payer: Self-pay

## 2018-04-29 ENCOUNTER — Encounter (HOSPITAL_COMMUNITY)
Admission: RE | Admit: 2018-04-29 | Discharge: 2018-04-29 | Disposition: A | Discharge status: Home / Self Care | Payer: BC Managed Care – PPO | Source: Ambulatory Visit | Attending: Obstetrics and Gynecology | Admitting: Obstetrics and Gynecology

## 2018-04-29 DIAGNOSIS — Z01812 Encounter for preprocedural laboratory examination: Secondary | ICD-10-CM | POA: Insufficient documentation

## 2018-04-29 HISTORY — DX: Unspecified osteoarthritis, unspecified site: M19.90

## 2018-04-29 HISTORY — DX: Anemia, unspecified: D64.9

## 2018-04-29 LAB — BASIC METABOLIC PANEL
Anion gap: 9 (ref 5–15)
BUN: 12 mg/dL (ref 8–23)
CO2: 28 mmol/L (ref 22–32)
CREATININE: 1.08 mg/dL — AB (ref 0.44–1.00)
Calcium: 8.4 mg/dL — ABNORMAL LOW (ref 8.9–10.3)
Chloride: 101 mmol/L (ref 98–111)
GFR calc Af Amer: >60 mL/min (ref >60)
GFR calc non Af Amer: 53 mL/min — ABNORMAL LOW (ref >60)
Glucose, Bld: 91 mg/dL (ref 70–99)
Potassium: 3 mmol/L — ABNORMAL LOW (ref 3.5–5.1)
Sodium: 138 mmol/L (ref 135–145)

## 2018-04-29 LAB — CBC
HCT: 26.2 % — ABNORMAL LOW (ref 36.0–46.0)
Hemoglobin: 8.3 g/dL — ABNORMAL LOW (ref 12.0–15.0)
MCH: 29.2 pg (ref 26.0–34.0)
MCHC: 31.7 g/dL (ref 30.0–36.0)
MCV: 92.3 fL (ref 80.0–100.0)
NRBC: 0 % (ref 0.0–0.2)
PLATELETS: 414 10*3/uL — AB (ref 150–400)
RBC: 2.84 MIL/uL — ABNORMAL LOW (ref 3.87–5.11)
RDW: 15.3 % (ref 11.5–15.5)
WBC: 8.6 10*3/uL (ref 4.0–10.5)

## 2018-04-29 NOTE — H&P (Addendum)
Kiara Ramirez is a 68 y.o. female  P: 2-0-0-2 presents for hysterectomy and bilateral salpingo-oophorectomy because of post menopausal bleeding. In 2018 the patient underwent hysteroscopy, dilatation, curettage and removal of an endometrial polyp because of post menopausal bleeding.   Since that time she was without bleeding until   04/13/2018 when she began cramping and noticed bleeding with wiping. In the days that followed the bleeding  progressed to  necessitate the wearing  of a pad that she changed  every 1.5 hours. At that time she was seen at [facility] and placed on Provera 10 mg while waiting for an ultrasound and endometrial biopsy to be scheduled.  She took over the counter analgesia for her cramping and had significant decrease in blood flow with Provera but not total resolution.  She denies any vaginitis symptoms, changes in bowel or bladder function and no fever.  A pelvic ultrasound on 04/20/2018 showed an anteverted uterus measuring 13.27 x 6.89 x 7.93 cm with # 2 fibroids:  fundal intramural-2.3 cm and posterior sub-serosal-2.8 cm; patient's endometrium was irregular and heterogeneous with cystic features measuring 36.7 mm; right ovary-2.79 cm and left ovary was not visualized.  A CBC at that time showed a hemoglobin/hematocrit as 10.0/29.9 respectively and an endometrial biopsy with Isolated small cluster of atypical cells.  A repeat of the endometrial biopsy for better sampling returned atrophic endometrium with no hyperplasia or malignancy. A review of both medical and surgical management options were given to the patient, however,  the patient  wishes to proceed with definitive therapy in the form of hysterectomy.   Past Medical History  OB History: G: 2;  P: 2-0-0-2; SVB: 1972 and 1976 with both infants weighing 7 lbs. 12oz.  GYN History: menrache 68 YO;  LMP: Menopausal;  Denies history of abnormal PAP smear  or STDs.   Last PAP smear: 2019-normal with negative HPV  Medical  History: Hypertension, Right Great Toe Gout, Migraine, B-12 Deficiency, Anxiety, Endometrial Polyp and Shingles  Surgical History:  1983 Tubal Sterilization and 2018 Hysteroscopic Resection of Endometrial Polyp Denies problems with anesthesia or history of blood transfusions  Family History: CHF, Breast Cancer, Anemia, Colon Cancer, Endometrial Cancer, Diabetes Mellitus and Arthritis  Social History: Married and employed at  [employer];    Denies tobacco or alcohol use  Medication: HCTZ  25 mg daily Norethindrone 5 mg,  2 po daily KCl-ER  10 mEq  daily Metoprolol Tartrate 50 mg daily ASA 81 mg daily    Allergies  Allergen Reactions  . Oxycodone Other (See Comments)  . Ace Inhibitors Other (See Comments)    Unknown  . Hydrocodone-Acetaminophen Nausea Only    Denies sensitivity to peanuts, shellfish, soy, latex or adhesives.    ROS: Admits to glasses (worn at night) and  bilateral knee pain with swelling on occaison;  Denies headache, vision changes, nasal congestion, dysphagia, tinnitus, dizziness, hoarseness, cough,  chest pain, shortness of breath, nausea, vomiting, diarrhea,constipation,  urinary frequency, urgency  dysuria, hematuria, vaginitis symptoms, pelvic pain, swelling of joints,easy bruising,  myalgias, skin rashes, unexplained weight loss and except as is mentioned in the history of present illness, patient's review of systems is otherwise negative.     Physical Exam  Bp: 122/82   P: 77 regular   R: 16  Temperature: 99.2 degrees F orally   O2Sat.: 98% (room air)   Weight: 218 lbs.  Height: 5'2"   BMI: 39.9  Neck: supple without masses or thyromegaly Lungs: clear  to auscultation Heart: regular rate and rhythm Abdomen: soft, non-tender and no organomegaly Pelvic:EGBUS- wnl; vagina-small blood;  uterus-12 weeks size, cervix without lesions or motion tenderness; adnexae-no tenderness or masses Extremities:  no clubbing, cyanosis or edema   Assesment: Post  Menopausal Bleeding   Disposition:  A discussion was held with patient regarding the indication for her procedure(s) along with the risks, which include but are not limited to: reaction to anesthesia, damage to adjacent organs, infection and excessive bleeding.   CSN# [id number]   [place] J. Florene Glen, PA-C  for Dr.  Harvie Bridge. [place] TLH/BSO/possible cystoscopy and possible abdominal hysterectomy.  R/B/A reviewed and questions answered.  Pt is anemic and has consented to getting blood.

## 2018-04-30 ENCOUNTER — Observation Stay (HOSPITAL_COMMUNITY): Payer: BC Managed Care – PPO | Admitting: Anesthesiology

## 2018-04-30 ENCOUNTER — Encounter (HOSPITAL_COMMUNITY): Payer: Self-pay | Admitting: Emergency Medicine

## 2018-04-30 ENCOUNTER — Encounter (HOSPITAL_COMMUNITY)
Admission: RE | Disposition: A | Discharge status: Home / Self Care | Payer: Self-pay | Source: Home / Self Care | Attending: Obstetrics and Gynecology

## 2018-04-30 ENCOUNTER — Inpatient Hospital Stay (HOSPITAL_COMMUNITY)
Admission: RE | Admit: 2018-04-30 | Discharge: 2018-05-02 | DRG: 742 | Disposition: A | Discharge status: Home / Self Care | Payer: BC Managed Care – PPO | Attending: Obstetrics and Gynecology | Admitting: Obstetrics and Gynecology

## 2018-04-30 ENCOUNTER — Other Ambulatory Visit: Payer: Self-pay

## 2018-04-30 DIAGNOSIS — Z6841 Body Mass Index (BMI) 40.0 and over, adult: Secondary | ICD-10-CM

## 2018-04-30 DIAGNOSIS — I10 Essential (primary) hypertension: Secondary | ICD-10-CM | POA: Diagnosis present

## 2018-04-30 DIAGNOSIS — D251 Intramural leiomyoma of uterus: Secondary | ICD-10-CM | POA: Diagnosis present

## 2018-04-30 DIAGNOSIS — N950 Postmenopausal bleeding: Secondary | ICD-10-CM | POA: Diagnosis present

## 2018-04-30 DIAGNOSIS — D252 Subserosal leiomyoma of uterus: Secondary | ICD-10-CM | POA: Diagnosis present

## 2018-04-30 DIAGNOSIS — N854 Malposition of uterus: Secondary | ICD-10-CM | POA: Diagnosis present

## 2018-04-30 DIAGNOSIS — D649 Anemia, unspecified: Secondary | ICD-10-CM | POA: Diagnosis present

## 2018-04-30 HISTORY — PX: CYSTOSCOPY: SHX5120

## 2018-04-30 HISTORY — PX: LAPAROSCOPY: SHX197

## 2018-04-30 HISTORY — PX: HYSTERECTOMY ABDOMINAL WITH SALPINGO-OOPHORECTOMY: SHX6792

## 2018-04-30 LAB — CBC
HCT: 26.1 % — ABNORMAL LOW (ref 36.0–46.0)
Hemoglobin: 8.3 g/dL — ABNORMAL LOW (ref 12.0–15.0)
MCH: 29.4 pg (ref 26.0–34.0)
MCHC: 31.8 g/dL (ref 30.0–36.0)
MCV: 92.6 fL (ref 80.0–100.0)
Platelets: 314 10*3/uL (ref 150–400)
RBC: 2.82 MIL/uL — ABNORMAL LOW (ref 3.87–5.11)
RDW: 15.1 % (ref 11.5–15.5)
WBC: 13.8 10*3/uL — ABNORMAL HIGH (ref 4.0–10.5)
nRBC: 0 % (ref 0.0–0.2)

## 2018-04-30 LAB — PREPARE RBC (CROSSMATCH)

## 2018-04-30 LAB — ABO/RH: ABO/RH(D): A POS

## 2018-04-30 SURGERY — LAPAROSCOPY, DIAGNOSTIC
Anesthesia: General | Laterality: Bilateral

## 2018-04-30 MED ORDER — KETOROLAC TROMETHAMINE 30 MG/ML IJ SOLN
INTRAMUSCULAR | Status: AC
  Filled 2018-04-30: qty 1

## 2018-04-30 MED ORDER — SUGAMMADEX SODIUM 500 MG/5ML IV SOLN
INTRAVENOUS | Status: DC | PRN
  Administered 2018-04-30: 250 mg via INTRAVENOUS

## 2018-04-30 MED ORDER — HYDROMORPHONE HCL 1 MG/ML IJ SOLN
INTRAMUSCULAR | Status: AC
  Filled 2018-04-30: qty 0.5

## 2018-04-30 MED ORDER — SIMETHICONE 80 MG PO CHEW: 80.0000 mg | CHEWABLE_TABLET | Freq: Four times a day (QID) | ORAL | Status: DC | PRN

## 2018-04-30 MED ORDER — HYDROCHLOROTHIAZIDE 25 MG PO TABS
25.0000 mg | ORAL_TABLET | Freq: Every day | ORAL | Status: DC
  Administered 2018-04-30 – 2018-05-01 (×2): 25 mg via ORAL
  Filled 2018-04-30 (×2): qty 1

## 2018-04-30 MED ORDER — GLYCOPYRROLATE 0.2 MG/ML IJ SOLN
INTRAMUSCULAR | Status: DC | PRN
  Administered 2018-04-30 (×2): 0.1 mg via INTRAVENOUS

## 2018-04-30 MED ORDER — ONDANSETRON HCL 4 MG/2ML IJ SOLN: 4.0000 mg | Freq: Four times a day (QID) | INTRAMUSCULAR | Status: DC | PRN

## 2018-04-30 MED ORDER — HYDROMORPHONE HCL 1 MG/ML IJ SOLN
INTRAMUSCULAR | Status: AC
  Filled 2018-04-30: qty 1

## 2018-04-30 MED ORDER — STERILE WATER FOR IRRIGATION IR SOLN
Status: DC | PRN
  Administered 2018-04-30: 1000 mL via INTRAVESICAL

## 2018-04-30 MED ORDER — LACTATED RINGERS IV SOLN
INTRAVENOUS | Status: DC
  Administered 2018-04-30: 21:00:00 via INTRAVENOUS

## 2018-04-30 MED ORDER — HYDROMORPHONE 1 MG/ML IV SOLN
INTRAVENOUS | Status: DC
  Administered 2018-04-30: 1 mg via INTRAVENOUS
  Administered 2018-04-30: 30 mg via INTRAVENOUS
  Administered 2018-05-01: 0.2 mg via INTRAVENOUS
  Administered 2018-05-01: 0.6 mg via INTRAVENOUS
  Filled 2018-04-30: qty 30

## 2018-04-30 MED ORDER — SODIUM CHLORIDE 0.9% FLUSH: 9.0000 mL | INTRAVENOUS | Status: DC | PRN

## 2018-04-30 MED ORDER — DEXAMETHASONE SODIUM PHOSPHATE 10 MG/ML IJ SOLN
INTRAMUSCULAR | Status: AC
  Filled 2018-04-30: qty 1

## 2018-04-30 MED ORDER — LIDOCAINE HCL (CARDIAC) PF 100 MG/5ML IV SOSY
PREFILLED_SYRINGE | INTRAVENOUS | Status: AC
  Filled 2018-04-30: qty 5

## 2018-04-30 MED ORDER — HYDROMORPHONE HCL 2 MG PO TABS
1.0000 mg | ORAL_TABLET | Freq: Four times a day (QID) | ORAL | Status: DC | PRN
  Administered 2018-05-01: 1 mg via ORAL
  Filled 2018-04-30: qty 1

## 2018-04-30 MED ORDER — CEFAZOLIN SODIUM-DEXTROSE 2-4 GM/100ML-% IV SOLN
2.0000 g | INTRAVENOUS | Status: AC
  Administered 2018-04-30: 2 g via INTRAVENOUS

## 2018-04-30 MED ORDER — FENTANYL CITRATE (PF) 250 MCG/5ML IJ SOLN
INTRAMUSCULAR | Status: AC
  Filled 2018-04-30: qty 5

## 2018-04-30 MED ORDER — POTASSIUM CHLORIDE ER 10 MEQ PO TBCR
20.0000 meq | EXTENDED_RELEASE_TABLET | Freq: Two times a day (BID) | ORAL | Status: DC
  Administered 2018-04-30 – 2018-05-01 (×3): 20 meq via ORAL
  Filled 2018-04-30 (×4): qty 2

## 2018-04-30 MED ORDER — CEFAZOLIN SODIUM-DEXTROSE 2-4 GM/100ML-% IV SOLN
INTRAVENOUS | Status: AC
  Filled 2018-04-30: qty 100

## 2018-04-30 MED ORDER — ROCURONIUM BROMIDE 100 MG/10ML IV SOLN
INTRAVENOUS | Status: AC
  Filled 2018-04-30: qty 1

## 2018-04-30 MED ORDER — ONDANSETRON HCL 4 MG/2ML IJ SOLN
INTRAMUSCULAR | Status: AC
  Filled 2018-04-30: qty 2

## 2018-04-30 MED ORDER — FENTANYL CITRATE (PF) 100 MCG/2ML IJ SOLN
INTRAMUSCULAR | Status: AC
  Filled 2018-04-30: qty 2

## 2018-04-30 MED ORDER — ONDANSETRON HCL 4 MG PO TABS: 4.0000 mg | ORAL_TABLET | Freq: Three times a day (TID) | ORAL | Status: DC | PRN

## 2018-04-30 MED ORDER — HYDROMORPHONE HCL 1 MG/ML IJ SOLN
INTRAMUSCULAR | Status: DC | PRN
  Administered 2018-04-30 (×2): 0.5 mg via INTRAVENOUS

## 2018-04-30 MED ORDER — PROPOFOL 10 MG/ML IV BOLUS
INTRAVENOUS | Status: AC
  Filled 2018-04-30: qty 40

## 2018-04-30 MED ORDER — FENTANYL CITRATE (PF) 100 MCG/2ML IJ SOLN
INTRAMUSCULAR | Status: DC | PRN
  Administered 2018-04-30 (×3): 100 ug via INTRAVENOUS

## 2018-04-30 MED ORDER — BUPIVACAINE HCL (PF) 0.25 % IJ SOLN
INTRAMUSCULAR | Status: AC
  Filled 2018-04-30: qty 30

## 2018-04-30 MED ORDER — METOPROLOL TARTRATE 25 MG PO TABS
25.0000 mg | ORAL_TABLET | Freq: Two times a day (BID) | ORAL | Status: DC
  Administered 2018-04-30 – 2018-05-01 (×3): 25 mg via ORAL
  Filled 2018-04-30 (×4): qty 1

## 2018-04-30 MED ORDER — 0.9 % SODIUM CHLORIDE (POUR BTL) OPTIME
TOPICAL | Status: DC | PRN
  Administered 2018-04-30: 1000 mL

## 2018-04-30 MED ORDER — DIPHENHYDRAMINE HCL 12.5 MG/5ML PO ELIX: 12.5000 mg | ORAL_SOLUTION | Freq: Four times a day (QID) | ORAL | Status: DC | PRN

## 2018-04-30 MED ORDER — PHENYLEPHRINE HCL 10 MG/ML IJ SOLN
INTRAMUSCULAR | Status: DC | PRN
  Administered 2018-04-30: 40 ug via INTRAVENOUS

## 2018-04-30 MED ORDER — MIDAZOLAM HCL 2 MG/2ML IJ SOLN
INTRAMUSCULAR | Status: AC
  Filled 2018-04-30: qty 2

## 2018-04-30 MED ORDER — EPHEDRINE SULFATE 50 MG/ML IJ SOLN
INTRAMUSCULAR | Status: DC | PRN
  Administered 2018-04-30 (×3): 5 mg via INTRAVENOUS

## 2018-04-30 MED ORDER — LACTATED RINGERS IV SOLN
INTRAVENOUS | Status: DC
  Administered 2018-04-30 (×2): via INTRAVENOUS

## 2018-04-30 MED ORDER — HYDROMORPHONE HCL 1 MG/ML IJ SOLN
0.2500 mg | INTRAMUSCULAR | Status: DC | PRN
  Administered 2018-04-30 (×2): 0.5 mg via INTRAVENOUS

## 2018-04-30 MED ORDER — ONDANSETRON HCL 4 MG/2ML IJ SOLN
INTRAMUSCULAR | Status: DC | PRN
  Administered 2018-04-30: 4 mg via INTRAVENOUS

## 2018-04-30 MED ORDER — MIDAZOLAM HCL 2 MG/2ML IJ SOLN
INTRAMUSCULAR | Status: DC | PRN
  Administered 2018-04-30: 2 mg via INTRAVENOUS

## 2018-04-30 MED ORDER — SODIUM CHLORIDE (PF) 0.9 % IJ SOLN
INTRAMUSCULAR | Status: AC
  Filled 2018-04-30: qty 10

## 2018-04-30 MED ORDER — DOCUSATE SODIUM 100 MG PO CAPS
100.0000 mg | ORAL_CAPSULE | Freq: Two times a day (BID) | ORAL | Status: DC
  Administered 2018-04-30 – 2018-05-01 (×3): 100 mg via ORAL
  Filled 2018-04-30 (×3): qty 1

## 2018-04-30 MED ORDER — IBUPROFEN 600 MG PO TABS
600.0000 mg | ORAL_TABLET | Freq: Four times a day (QID) | ORAL | Status: DC
  Administered 2018-05-01 – 2018-05-02 (×4): 600 mg via ORAL
  Filled 2018-04-30 (×4): qty 1

## 2018-04-30 MED ORDER — DEXAMETHASONE SODIUM PHOSPHATE 10 MG/ML IJ SOLN
INTRAMUSCULAR | Status: DC | PRN
  Administered 2018-04-30: 10 mg via INTRAVENOUS

## 2018-04-30 MED ORDER — KETOROLAC TROMETHAMINE 30 MG/ML IJ SOLN
15.0000 mg | Freq: Four times a day (QID) | INTRAMUSCULAR | Status: AC
  Administered 2018-04-30 – 2018-05-01 (×3): 15 mg via INTRAVENOUS
  Filled 2018-04-30 (×3): qty 1

## 2018-04-30 MED ORDER — GLYCOPYRROLATE 0.2 MG/ML IJ SOLN
INTRAMUSCULAR | Status: AC
  Filled 2018-04-30: qty 1

## 2018-04-30 MED ORDER — MEPERIDINE HCL 25 MG/ML IJ SOLN: 6.2500 mg | INTRAMUSCULAR | Status: DC | PRN

## 2018-04-30 MED ORDER — SODIUM CHLORIDE 0.9 % IV SOLN
10.0000 mL/h | Freq: Once | INTRAVENOUS | Status: AC
  Administered 2018-04-30: 11:00:00 via INTRAVENOUS

## 2018-04-30 MED ORDER — PROPOFOL 10 MG/ML IV BOLUS
INTRAVENOUS | Status: DC | PRN
  Administered 2018-04-30: 30 mg via INTRAVENOUS

## 2018-04-30 MED ORDER — NALOXONE HCL 0.4 MG/ML IJ SOLN: 0.4000 mg | INTRAMUSCULAR | Status: DC | PRN

## 2018-04-30 MED ORDER — BUPIVACAINE HCL (PF) 0.25 % IJ SOLN
INTRAMUSCULAR | Status: DC | PRN
  Administered 2018-04-30: 10 mL

## 2018-04-30 MED ORDER — ROCURONIUM BROMIDE 100 MG/10ML IV SOLN
INTRAVENOUS | Status: DC | PRN
  Administered 2018-04-30: 20 mg via INTRAVENOUS
  Administered 2018-04-30: 50 mg via INTRAVENOUS

## 2018-04-30 MED ORDER — HYDROMORPHONE HCL 1 MG/ML IJ SOLN
INTRAMUSCULAR | Status: AC
  Administered 2018-04-30: 0.5 mg via INTRAVENOUS
  Filled 2018-04-30: qty 0.5

## 2018-04-30 MED ORDER — DIPHENHYDRAMINE HCL 50 MG/ML IJ SOLN: 12.5000 mg | Freq: Four times a day (QID) | INTRAMUSCULAR | Status: DC | PRN

## 2018-04-30 MED ORDER — MENTHOL 3 MG MT LOZG: 1.0000 | LOZENGE | OROMUCOSAL | Status: DC | PRN

## 2018-04-30 MED ORDER — PROMETHAZINE HCL 25 MG/ML IJ SOLN: 6.2500 mg | INTRAMUSCULAR | Status: DC | PRN

## 2018-04-30 SURGICAL SUPPLY — 51 items
ADH SKN CLS APL DERMABOND .7 (GAUZE/BANDAGES/DRESSINGS) ×2
APL SKNCLS STERI-STRIP NONHPOA (GAUZE/BANDAGES/DRESSINGS) ×2
BENZOIN TINCTURE PRP APPL 2/3 (GAUZE/BANDAGES/DRESSINGS) ×2 IMPLANT
CLOSURE WOUND 1/2 X4 (GAUZE/BANDAGES/DRESSINGS) ×1
COVER MAYO STAND STRL (DRAPES) ×4 IMPLANT
DERMABOND ADVANCED (GAUZE/BANDAGES/DRESSINGS) ×2
DERMABOND ADVANCED .7 DNX12 (GAUZE/BANDAGES/DRESSINGS) ×2 IMPLANT
DRSG OPSITE POSTOP 3X4 (GAUZE/BANDAGES/DRESSINGS) ×2 IMPLANT
DRSG OPSITE POSTOP 4X10 (GAUZE/BANDAGES/DRESSINGS) ×2 IMPLANT
DURAPREP 26ML APPLICATOR (WOUND CARE) ×4 IMPLANT
FILTER SMOKE EVAC LAPAROSHD (FILTER) ×8 IMPLANT
GAUZE SPONGE 4X4 16PLY XRAY LF (GAUZE/BANDAGES/DRESSINGS) ×2 IMPLANT
GLOVE BIO SURGEON STRL SZ7.5 (GLOVE) ×8 IMPLANT
GLOVE BIOGEL PI IND STRL 7.0 (GLOVE) ×6 IMPLANT
GLOVE BIOGEL PI IND STRL 7.5 (GLOVE) ×4 IMPLANT
GLOVE BIOGEL PI INDICATOR 7.0 (GLOVE) ×6
GLOVE BIOGEL PI INDICATOR 7.5 (GLOVE) ×4
GOWN STRL REUS W/TWL LRG LVL3 (GOWN DISPOSABLE) ×12 IMPLANT
HEMOSTAT ARISTA ABSORB 3G PWDR (HEMOSTASIS) ×2 IMPLANT
HIBICLENS CHG 4% 4OZ BTL (MISCELLANEOUS) ×4 IMPLANT
NEEDLE INSUFFLATION 120MM (ENDOMECHANICALS) ×4 IMPLANT
NS IRRIG 1000ML POUR BTL (IV SOLUTION) ×4 IMPLANT
OCCLUDER COLPOPNEUMO (BALLOONS) ×4 IMPLANT
PACK LAPAROSCOPY BASIN (CUSTOM PROCEDURE TRAY) ×4 IMPLANT
PACK TRENDGUARD 450 HYBRID PRO (MISCELLANEOUS) IMPLANT
PROTECTOR NERVE ULNAR (MISCELLANEOUS) ×8 IMPLANT
SCISSORS LAP 5X35 DISP (ENDOMECHANICALS) ×4 IMPLANT
SET CYSTO W/LG BORE CLAMP LF (SET/KITS/TRAYS/PACK) ×4 IMPLANT
SET IRRIG TUBING LAPAROSCOPIC (IRRIGATION / IRRIGATOR) ×4 IMPLANT
SHEARS HARMONIC ACE PLUS 36CM (ENDOMECHANICALS) ×4 IMPLANT
SPONGE LAP 18X18 RF (DISPOSABLE) ×6 IMPLANT
STRIP CLOSURE SKIN 1/2X4 (GAUZE/BANDAGES/DRESSINGS) ×1 IMPLANT
SUT CHROMIC 2 0 CT 1 (SUTURE) ×2 IMPLANT
SUT MNCRL AB 3-0 PS2 27 (SUTURE) ×12 IMPLANT
SUT VIC AB 0 CT1 18XCR BRD8 (SUTURE) IMPLANT
SUT VIC AB 0 CT1 27 (SUTURE) ×12
SUT VIC AB 0 CT1 27XBRD ANBCTR (SUTURE) IMPLANT
SUT VIC AB 0 CT1 8-18 (SUTURE) ×8
SUT VIC AB 2-0 SH 27 (SUTURE) ×8
SUT VIC AB 2-0 SH 27XBRD (SUTURE) IMPLANT
SUT VICRYL 0 TIES 12 18 (SUTURE) ×2 IMPLANT
SUT VICRYL 0 UR6 27IN ABS (SUTURE) ×10 IMPLANT
SYR 50ML LL SCALE MARK (SYRINGE) ×4 IMPLANT
TIP UTERINE 5.1X6CM LAV DISP (MISCELLANEOUS) ×2 IMPLANT
TOWEL OR 17X24 6PK STRL BLUE (TOWEL DISPOSABLE) ×8 IMPLANT
TRAY FOLEY W/BAG SLVR 14FR (SET/KITS/TRAYS/PACK) ×4 IMPLANT
TRENDGUARD 450 HYBRID PRO PACK (MISCELLANEOUS) ×4
TROCAR XCEL NON-BLD 11X100MML (ENDOMECHANICALS) ×8 IMPLANT
TROCAR XCEL NON-BLD 5MMX100MML (ENDOMECHANICALS) ×4 IMPLANT
TUBING INSUF HEATED (TUBING) ×4 IMPLANT
WARMER LAPAROSCOPE (MISCELLANEOUS) ×4 IMPLANT

## 2018-04-30 NOTE — Addendum Note (Signed)
Addendum  created 04/30/18 1849 by Flossie Dibble, CRNA   Clinical Note Signed

## 2018-04-30 NOTE — Anesthesia Preprocedure Evaluation (Signed)
Anesthesia Evaluation  Patient identified by MRN, date of birth, ID band Patient awake    Reviewed: Allergy & Precautions, NPO status , Patient's Chart, lab work & pertinent test results, reviewed documented beta blocker date and time   Airway Mallampati: II  TM Distance: >3 FB Neck ROM: Full    Dental  (+) Dental Advisory Given   Pulmonary neg pulmonary ROS,    Pulmonary exam normal breath sounds clear to auscultation       Cardiovascular hypertension, Pt. on home beta blockers and Pt. on medications Normal cardiovascular exam Rhythm:Regular Rate:Normal     Neuro/Psych  Headaches, PSYCHIATRIC DISORDERS Anxiety    GI/Hepatic negative GI ROS, Neg liver ROS,   Endo/Other  Morbid obesity  Renal/GU negative Renal ROS     Musculoskeletal  (+) Arthritis ,   Abdominal (+) + obese,   Peds  Hematology  (+) Blood dyscrasia, anemia ,   Anesthesia Other Findings   Reproductive/Obstetrics negative OB ROS                             Anesthesia Physical Anesthesia Plan  ASA: II  Anesthesia Plan: General   Post-op Pain Management:    Induction: Intravenous  PONV Risk Score and Plan:   Airway Management Planned:   Additional Equipment:   Intra-op Plan:   Post-operative Plan: Extubation in OR  Informed Consent: I have reviewed the patients History and Physical, chart, labs and discussed the procedure including the risks, benefits and alternatives for the proposed anesthesia with the patient or authorized representative who has indicated his/her understanding and acceptance.     Dental advisory given  Plan Discussed with: CRNA  Anesthesia Plan Comments:         Anesthesia Quick Evaluation

## 2018-04-30 NOTE — Op Note (Addendum)
Preop Diagnosis: 1.Post Menopausal Bleeding 2.Fibroids   Postop Diagnosis: 1.Post Menopausal Bleeding, Fibroids   Procedure: 1.DIAGNOSTIC LAPAROSCOPY 2.TOTAL ABDOMINAL HYSTERECTOMY 3.BILATERAL SALPINGO OOPHORECTOMY 4.CYSTOSCOPY  Anesthesia: General  Anesthesiologist: Nolon Nations, MD   Attending: Everett Graff, MD   Assistant:  E.Florene Glen, PA-C  Findings: Large fibroid uterus, sounded to 8cm  Pathology: Uterus, ovaries and remaining portions of tubes  Fluids: 2100 cc 315 cc of blood (1u PRBC)  UOP: 150 cc  EBL: 716 cc  Complications: None  Procedure: The patient was taken to the operating room, placed under general anesthesia and prepped and draped in the normal sterile fashion. A Foley catheter was placed in the bladder. The uterus sounded to 8.  A weighted speculum and vaginal retractors were placed in the vagina.  Tenaculum was placed on the anterior lip of the cervix.  A size 6 cm tip was used and the rumi was placed, tip balloon and occluder insufflated. Attention was then turned to the abdomen. A 10 mm infraumbilical incision was made with the scalpel.  The Veress needle was placed in the intra-abdominal cavity and insufflation obtained.  A 10 mm trochar was advanced into the intra-abdominal cavity and the laparoscope was introduced.  The uterus expanded to both side walls and was at 15wk size.  Decision made to perform abdominal hysterectomy.  The fascia at the umbilicus was repaired with 0-vicryl and skin reapproximated with 3-0 monocryl via a subcuticular stitch.  The koh ring which had been stitched in place was removed.  After regowning and gloving, a pfannenstiel incision was made and carried down to the underlying layer of fascia which was incised bilaterally with the bovie and then extended with mayo scissors bilaterally.  The fascia was then dissected off the underlying rectus muscles using blunt and sharp dissection. The rectus muscles were split bluntly in the  midline and the peritoneum entered bluntly without complication. This peritoneal incision was then extended superiorly and inferiorly with care given to prevent bowel or bladder injury. Attention was then turned to the pelvis. A retractor was placed into the incision, and the bowel was packed away with moist laparotomy sponges. The uterus at this point was noted to be mobilized and was delivered up out of the abdomen.  The round ligaments on each side were clamped, suture ligated with 0 Vicryl, and transected with electrocautery allowing entry into the broad ligament. Of note, all sutures used in this procedure are 0 Vicryl unless otherwise noted.   A hole was created in the clear portion of the posterior broad ligament and the utero-ovarian ligament was clamped on the patient's left side then cut, and doubly suture ligated with good hemostasis.  This procedure was repeated in an identical fashion on the opposite side.  A bladder flap was created.  The bladder was then bluntly dissected off the lower uterine segment and cervix with good hemostasis noted. The uterine arteries were then skeletonized bilaterally and then clamped, uterine fundus amputates, cut, and doubly suture ligated with care given to prevent ureteral injury.  The uterosacral ligaments were then clamped, cut, and ligated bilaterally.  Finally, the cardinal ligaments were clamped, cut, and suture ligated bilaterally.  Acutely curved clamps were placed across the vagina just under the cervix, and the specimen was amputated and sent to pathology. The vaginal cuff angles were closed with Heaney stiches with care given to incorporate the uterosacral-cardinal ligament pedicles on both sides. The middle of the vaginal cuff was closed with a series of interrupted figure-of-eight  sutures with care given to incorporate the anterior pubocervical fascia and the posterior rectovaginal fascia.   A babcock was used to grasp the left adnexa, clamped beneath,  excised and ligated x1 and suture ligated x 1.  The same was done on the contralateral side.  The pelvis was irrigated and hemostasis was reconfirmed at all pedicles and along the pelvic sidewall.  Hemostatic powder was applied to the cuff and over the ovarian pedicles bilaterally.  All laparotomy sponges and instruments were removed from the abdomen. The peritoneum was closed with a running stitch of 2-0 plain, and the fascia was closed in a running fashion 0 vicryl. The subcutaneous layer was reapproximated with 2-0 plain gut. The skin was closed with a 3-0 monocryl via a subcuticular stitch. A graves speculum was placed in vagina and cuff intact with good hemostasis.  Cystoscopy was performed and efflux was noted from both ureters.  Sponge, lap, needle, and instrument counts were correct times two.  Steristrips and benzoin were applied to the pfannenstiel incision and dermabond applied to the umbilical incision.  The patient was taken to the recovery area awake, extubated and in stable condition.

## 2018-04-30 NOTE — Anesthesia Procedure Notes (Signed)
Procedure Name: Intubation Date/Time: 04/30/2018 9:12 AM Performed by: Genevie Ann, CRNA Pre-anesthesia Checklist: Patient identified, Emergency Drugs available, Suction available, Patient being monitored and Timeout performed Patient Re-evaluated:Patient Re-evaluated prior to induction Oxygen Delivery Method: Circle system utilized Preoxygenation: Pre-oxygenation with 100% oxygen Induction Type: IV induction Ventilation: Mask ventilation without difficulty Laryngoscope Size: Mac and 3 Grade View: Grade I Tube type: Oral Laser Tube: Cuffed inflated with minimal occlusive pressure - saline Tube size: 7.0 mm Number of attempts: 1 Placement Confirmation: ETT inserted through vocal cords under direct vision and positive ETCO2 Secured at: 22 cm Dental Injury: Teeth and Oropharynx as per pre-operative assessment

## 2018-04-30 NOTE — Anesthesia Postprocedure Evaluation (Signed)
Anesthesia Post Note  Patient: Kiara Ramirez  Procedure(s) Performed: LAPAROSCOPY DIAGNOSTIC TOTAL ABDOMINAL HYSTERECTOMY WITH BILATERAL SALPINGO-OOPHORECTOMY (Bilateral ) CYSTOSCOPY     Patient location during evaluation: PACU Anesthesia Type: General Level of consciousness: sedated and patient cooperative Pain management: pain level controlled Vital Signs Assessment: post-procedure vital signs reviewed and stable Respiratory status: spontaneous breathing Cardiovascular status: stable Anesthetic complications: no    Last Vitals:  Vitals:   04/30/18 1605 04/30/18 1711  BP: (!) 128/92 140/87  Pulse: (!) 58 61  Resp: 11 10  Temp:  36.6 C  SpO2: 90% 98%    Last Pain:  Vitals:   04/30/18 1711  TempSrc: Oral  PainSc:    Pain Goal: Patients Stated Pain Goal: 5 (04/30/18 0751)              @ANFLOW60MIN (12500)  )Nolon Nations

## 2018-04-30 NOTE — Transfer of Care (Signed)
Immediate Anesthesia Transfer of Care Note  Patient: Kiara Ramirez  Procedure(s) Performed: LAPAROSCOPY DIAGNOSTIC TOTAL ABDOMINAL HYSTERECTOMY WITH BILATERAL SALPINGO-OOPHORECTOMY (Bilateral ) CYSTOSCOPY  Patient Location: PACU  Anesthesia Type:General  Level of Consciousness: awake, alert  and oriented  Airway & Oxygen Therapy: Patient Spontanous Breathing  Post-op Assessment: Report given to RN and Post -op Vital signs reviewed and stable  Post vital signs: Reviewed and stable  Last Vitals:  Vitals Value Taken Time  BP    Temp    Pulse    Resp    SpO2      Last Pain:  Vitals:   04/30/18 0751  TempSrc: Oral      Patients Stated Pain Goal: 5 (10/06/74 2831)  Complications: No apparent anesthesia complications

## 2018-04-30 NOTE — Discharge Instructions (Signed)
Call [facility] @ [phone] if:  You have a temperature greater than or equal to 100.4 degrees Farenheit orally You have pain that is not made better by the pain medication given and taken as directed You have excessive bleeding or problems urinating  Take Colace (Docusate Sodium/Stool Softener) 100 mg 2-3 times daily while taking narcotic pain medicine to avoid constipation or until bowel movements are regular. Take, with food, Ibuprofen 600 mg every 6 hours for 5 days then as needed for pain.  You may drive after 2  weeks You may walk up steps  You may shower  You may resume a regular diet  Keep incisions clean and dry; remove honeycomb dressing on May 07, 2018 Do not lift over 15 pounds for 6 weeks Avoid anything in vagina for 6 weeks (or until after your post-operative visit)

## 2018-04-30 NOTE — Anesthesia Postprocedure Evaluation (Signed)
Anesthesia Post Note  Patient: Kiara Ramirez  Procedure(s) Performed: LAPAROSCOPY DIAGNOSTIC TOTAL ABDOMINAL HYSTERECTOMY WITH BILATERAL SALPINGO-OOPHORECTOMY (Bilateral ) CYSTOSCOPY     Patient location during evaluation: [facility] Anesthesia Type: General Level of consciousness: awake and alert Pain management: satisfactory to patient Vital Signs Assessment: post-procedure vital signs reviewed and stable Respiratory status: spontaneous breathing and respiratory function stable Cardiovascular status: stable Postop Assessment: adequate PO intake Anesthetic complications: no    Last Vitals:  Vitals:   04/30/18 1711 04/30/18 1814  BP: 140/87 118/77  Pulse: 61 72  Resp: 10 10  Temp: 36.6 C   SpO2: 98% 98%    Last Pain:  Vitals:   04/30/18 1800  TempSrc:   PainSc: Asleep   Pain Goal: Patients Stated Pain Goal: 5 (04/30/18 0751)              @ANFLOW60MIN (12500)  )Bryanah Sidell

## 2018-05-01 ENCOUNTER — Encounter (HOSPITAL_COMMUNITY): Payer: Self-pay | Admitting: Obstetrics and Gynecology

## 2018-05-01 LAB — CBC
HCT: 23.9 % — ABNORMAL LOW (ref 36.0–46.0)
HEMOGLOBIN: 7.6 g/dL — AB (ref 12.0–15.0)
MCH: 29.3 pg (ref 26.0–34.0)
MCHC: 31.8 g/dL (ref 30.0–36.0)
MCV: 92.3 fL (ref 80.0–100.0)
Platelets: 309 10*3/uL (ref 150–400)
RBC: 2.59 MIL/uL — ABNORMAL LOW (ref 3.87–5.11)
RDW: 15.5 % (ref 11.5–15.5)
WBC: 10.8 10*3/uL — ABNORMAL HIGH (ref 4.0–10.5)
nRBC: 0 % (ref 0.0–0.2)

## 2018-05-01 LAB — BASIC METABOLIC PANEL
Anion gap: 8 (ref 5–15)
BUN: 13 mg/dL (ref 8–23)
CO2: 27 mmol/L (ref 22–32)
Calcium: 7.7 mg/dL — ABNORMAL LOW (ref 8.9–10.3)
Chloride: 101 mmol/L (ref 98–111)
Creatinine, Ser: 1.02 mg/dL — ABNORMAL HIGH (ref 0.44–1.00)
GFR calc Af Amer: >60 mL/min (ref >60)
GFR calc non Af Amer: 57 mL/min — ABNORMAL LOW (ref >60)
Glucose, Bld: 119 mg/dL — ABNORMAL HIGH (ref 70–99)
POTASSIUM: 4.1 mmol/L (ref 3.5–5.1)
Sodium: 136 mmol/L (ref 135–145)

## 2018-05-01 NOTE — Progress Notes (Addendum)
Airiel Oblinger is a4 y.o.  [id number]  Post Op Date #1: Laparoscopy/Total Abdominal Hysterectomy/Bilateral Salpingo-oophorectomy/Cystoscopy  Subjective: Patient is Doing well postoperatively. Patient has Pain is controlled with current analgesics. Medications being used: prescription NSAID's including Ketorolac 15 mg and narcotic analgesics including Hydromorphone PCA. The patient has been ambulating in the halls without dizziness, tolerating liquids without nausea but hasn't voided yet.   Objective: Vital signs in last 24 hours: Temp:  [97.6 F (36.4 C)-98.7 F (37.1 C)] 97.7 F (36.5 C) (01/17 0453) Pulse Rate:  [58-89] 78 (01/17 0453) Resp:  [10-19] 16 (01/17 0519) BP: (110-140)/(57-92) 128/82 (01/17 0453) SpO2:  [90 %-100 %] 100 % (01/17 0519) Weight:  [98.6 kg] 98.6 kg (01/16 0751)  Intake/Output from previous day: 01/16 0701 - 01/17 0700 In: 2815 [I.V.:2500] Out: 1225 [Urine:825] Intake/Output this shift: No intake/output data recorded. Recent Labs  Lab 04/29/18 1130 04/30/18 1321 05/01/18 0550  WBC 8.6 13.8* 10.8*  HGB 8.3* 8.3* 7.6*  HCT 26.2* 26.1* 23.9*  PLT 414* 314 309     Recent Labs  Lab 04/29/18 1130 05/01/18 0550  NA 138 136  K 3.0* 4.1  CL 101 101  CO2 28 27  BUN 12 13  CREATININE 1.08* 1.02*  CALCIUM 8.4* 7.7*  GLUCOSE 91 119*    EXAM: General: alert, cooperative and no distress Resp: clear to auscultation bilaterally Cardio: regular rate and rhythm, S1, S2 normal, no murmur, click, rub or gallop GI: Bowel sounds present in all quadrants; umbilical and lower abdominal dressing dry and intact with the lower dressing containiing dried spots of blood along incsion line. Extremities: Homans sign is negative, no sign of DVT and SCD hose in place and functioning-no calf tenderness.   Assessment: s/p Procedure(s): LAPAROSCOPY DIAGNOSTIC TOTAL ABDOMINAL HYSTERECTOMY WITH BILATERAL SALPINGO-OOPHORECTOMY CYSTOSCOPY: stable, progressing well and  anemia  Plan: Advance diet Encourage ambulation Advance to PO medication Routine care  LOS: 1 day    Earnstine Regal, PA-C 05/01/2018 7:42 AM   Agree with above.  Pt seen yesterday around 1500.  She was doing well.

## 2018-05-02 LAB — TYPE AND SCREEN
ABO/RH(D): A POS
ANTIBODY SCREEN: NEGATIVE
Unit division: 0
Unit division: 0

## 2018-05-02 LAB — BPAM RBC
Blood Product Expiration Date: 202002032359
Blood Product Expiration Date: 202002042359
ISSUE DATE / TIME: 202001161105
ISSUE DATE / TIME: 202001161105
Unit Type and Rh: 6200
Unit Type and Rh: 6200

## 2018-05-02 MED ORDER — IBUPROFEN 600 MG PO TABS: 600.0000 mg | ORAL_TABLET | Freq: Four times a day (QID) | ORAL | 0 refills | Status: DC | PRN

## 2018-05-02 MED ORDER — HYDROMORPHONE HCL 2 MG PO TABS: 1.0000 mg | ORAL_TABLET | Freq: Four times a day (QID) | ORAL | 0 refills | Status: DC | PRN

## 2018-05-02 NOTE — Progress Notes (Signed)
2 Days Post-Op Procedure(s) (LRB): LAPAROSCOPY DIAGNOSTIC TOTAL ABDOMINAL HYSTERECTOMY WITH BILATERAL SALPINGO-OOPHORECTOMY (Bilateral) CYSTOSCOPY  Subjective: Patient reports tolerating PO, + flatus and no problems voiding.  She denies any further pink discharge and denies feeling lightheaded or dizzy.  Objective: I have reviewed patient's vital signs and intake and output.  General: alert and no distress Resp: clear to auscultation bilaterally Cardio: regular rate and rhythm GI: soft, dressings in place, soft BS, ND, minimal tenderness Extremities: no calf tenderness Vaginal Bleeding: none  Assessment: s/p Procedure(s): LAPAROSCOPY DIAGNOSTIC TOTAL ABDOMINAL HYSTERECTOMY WITH BILATERAL SALPINGO-OOPHORECTOMY (Bilateral) CYSTOSCOPY: progressing well  Plan: Discharge home  D/c instructions and precautions reviewed Questions answered Note for work requested and I asked Kim, RN to give it to her at discharge  LOS: 2 days    Delice Lesch 05/02/2018, 10:57 AM

## 2018-05-02 NOTE — Discharge Summary (Signed)
Physician Discharge Summary  Patient ID: Kiara Ramirez MRN: [id number] DOB/AGE: [birth year] 67 y.o.  Admit date: 04/30/2018 Discharge date: 05/02/2018  Admission Diagnoses: 1.PMB 2.Fibroids 3.Anemia  Discharge Diagnoses:  Active Problems:   Postmenopausal bleeding   Post-menopausal bleeding s/p TAH/BSO Anemia  Discharged Condition: good  Hospital Course: Pt underwent TAH/BSO without complication.  On POD1 pt started passing flatus, was tolerating po without n/v, ambulating without difficulty, lightheadedness or dizziness.  Pt was ready to go home on POD 2.  Pt's BP was wnl and even low so I instructed her to schedule lab visit in office on Mon or Tues for BP check so she could find out when to restart her BP med.  Pt rec'd 1u of PRBCs intraop d/t starting out with low Hgb.  EBL wnl.  Consults: None  Significant Diagnostic Studies: labs: Hgb 7.6 and mildly elevated creatinine.   Treatments: surgery: TAH/BSO/Cystoscopy  Discharge Exam: Blood pressure 102/67, pulse 78, temperature (!) 97.5 F (36.4 C), temperature source Axillary, resp. rate 18, height 5\' 1"  (1.549 m), weight 98.6 kg, SpO2 100 %. General appearance: alert and no distress Resp: clear to auscultation bilaterally Cardio: regular rate and rhythm GI: soft, ND, soft BS, ND, dressing intact, minimal tenderness Extremities: no calf tenderness Incision/Wound: dressing intact No vaginal bleeding  Disposition: Discharge disposition: 01-Home or Self Care        Allergies as of 05/02/2018      Reactions   Ace Inhibitors Swelling   Hydrocodone-acetaminophen Nausea Only   Oxycodone Nausea And Vomiting   Headache      Medication List    STOP taking these medications   naproxen 500 MG tablet Commonly known as:  NAPROSYN     TAKE these medications   aspirin 81 MG tablet Take 81 mg by mouth daily.   cyanocobalamin 1000 MCG/ML injection Commonly known as:  (VITAMIN B-12) Inject 1 mL (1,000 mcg total) into the  muscle every 30 (thirty) days. Reported on 06/28/2015   hydrochlorothiazide 25 MG tablet Commonly known as:  HYDRODIURIL TAKE 1 TABLET BY MOUTH DAILY   HYDROmorphone 2 MG tablet Commonly known as:  DILAUDID Take 0.5 tablets (1 mg total) by mouth every 6 (six) hours as needed for moderate pain or severe pain.   ibuprofen 600 MG tablet Commonly known as:  ADVIL,MOTRIN Take 1 tablet (600 mg total) by mouth every 6 (six) hours as needed.   metoprolol tartrate 50 MG tablet Commonly known as:  LOPRESSOR Take 0.5 tablets (25 mg total) by mouth 2 (two) times daily.   potassium chloride 10 MEQ tablet Commonly known as:  K-DUR TAKE 2 TABLETS BY MOUTH TWICE DAILY      Follow-up Information    Everett Graff, MD Follow up on 06/17/2018.   Specialty:  Obstetrics and Gynecology Why:  appointment time is 10:30 a.m. Contact information: [address]. [address] [address] [phone]           Signed: Delice Lesch 05/02/2018, 11:21 AM

## 2018-05-29 ENCOUNTER — Ambulatory Visit: Payer: BC Managed Care – PPO | Admitting: Family Medicine

## 2018-05-29 ENCOUNTER — Encounter: Payer: Self-pay | Admitting: Family Medicine

## 2018-05-29 VITALS — BP 110/74 | HR 82 | Temp 97.9°F | Wt 207.1 lb

## 2018-05-29 DIAGNOSIS — E538 Deficiency of other specified B group vitamins: Secondary | ICD-10-CM

## 2018-05-29 DIAGNOSIS — R6884 Jaw pain: Secondary | ICD-10-CM | POA: Diagnosis not present

## 2018-05-29 DIAGNOSIS — I10 Essential (primary) hypertension: Secondary | ICD-10-CM

## 2018-05-29 MED ORDER — CYANOCOBALAMIN 1000 MCG/ML IJ SOLN
1000.0000 ug | Freq: Once | INTRAMUSCULAR | Status: AC
  Administered 2018-05-29: 1000 ug via INTRAMUSCULAR

## 2018-05-29 NOTE — Addendum Note (Signed)
Addended by: Elie Confer on: 05/29/2018 12:31 PM   Modules accepted: Orders

## 2018-05-29 NOTE — Progress Notes (Signed)
   Subjective:    Patient ID: Kiara Ramirez, female    DOB: 10-06-1950, 68 y.o.   MRN: [id number]  HPI Here to check her BP and with some questions. Ever since her laparoscopy her BP has been runnign low and she felt lightheaded. For the past few weeks she has been taking only the HCTZ but not the Metoprolol. Since stopping the Metoprolol she feels better and her BP has been stable in the 110s to 120s over 70s, with the highest being 136/82. Also she mentions frequent burping and intermittent aching pains in her jaws for the past few weeks. No chest pain or SOB. No heartburn. She is woories because her mother had heart disease and CHF.    Review of Systems  Constitutional: Negative.   HENT: Negative.   Eyes: Negative.   Respiratory: Negative.   Cardiovascular: Negative.   Gastrointestinal: Negative for abdominal distention, abdominal pain, constipation, diarrhea, nausea and vomiting.  Neurological: Negative.        Objective:   Physical Exam Constitutional:      General: She is not in acute distress.    Appearance: Normal appearance.  Neck:     Musculoskeletal: Normal range of motion.  Cardiovascular:     Rate and Rhythm: Normal rate and regular rhythm.     Pulses: Normal pulses.     Heart sounds: Normal heart sounds.  Pulmonary:     Effort: Pulmonary effort is normal. No respiratory distress.     Breath sounds: Normal breath sounds. No stridor. No wheezing, rhonchi or rales.  Abdominal:     General: Abdomen is flat. Bowel sounds are normal. There is no distension.     Palpations: Abdomen is soft. There is no mass.     Tenderness: There is no abdominal tenderness. There is no guarding or rebound.     Hernia: No hernia is present.  Lymphadenopathy:     Cervical: No cervical adenopathy.  Neurological:     General: No focal deficit present.     Mental Status: She is alert and oriented to person, place, and time.           Assessment & Plan:  Her HTN has been well  controlled after stopping the Metoprolol, so we agreed to stay off this. Her belching and jaw pain is concerning for possible signs of angina. We will refer her to Cardiology to evaluate further. She had a normal EKG here last December.  Alysia Penna, MD

## 2018-06-07 ENCOUNTER — Encounter: Payer: Self-pay | Admitting: Cardiology

## 2018-06-07 NOTE — Progress Notes (Signed)
Cardiology Office Note   Date:  06/08/2018   ID:  Kiara Ramirez, DOB [birth year], MRN [id number]  PCP:  Kiara Morale, MD  Cardiologist:   No primary care provider on file. Referring:  Kiara Morale, MD  Chief Complaint  Patient presents with  . Jaw Pain      History of Present Illness: Kiara Ramirez is a 68 y.o. female who presents for evaluation of jaw pain. The patient has no past cardiac history.    She had a history of hypertension.  She actually was taken off of beta-blocker recently when she had hysterectomy and her blood pressure was low.  She has been getting some discomfort in her jaw.  This happens sporadically.  It seems to happen at rest.  She cannot bring it on with activity.  She describes it on her right jaw up with the TMJ.  There may be a little down posterior right side of her neck.  She does not get associated nausea vomiting or diaphoresis.  She does not have shortness of breath, PND or orthopnea.  She does not have presyncope or syncope.  She gets up and down and has to walk around her office at work but she does not otherwise do a lot of activities.  She does not exercise.  She has rare palpitations.  She has had no prior cardiac history or work-up.   Past Medical History:  Diagnosis Date  . Anemia   . Anxiety   . 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 meds  . B12 deficiency   . Hypertension     Past Surgical History:  Procedure Laterality Date  . COLONOSCOPY  07/13/2007   per Dr. Fuller Plan, repeat in 5 yrs - polyps  . CYSTOSCOPY  04/30/2018   Procedure: CYSTOSCOPY;  Surgeon: Everett Graff, MD;  Location: [facility];  Service: Gynecology;;  . HYSTERECTOMY ABDOMINAL WITH SALPINGO-OOPHORECTOMY Bilateral 04/30/2018   Procedure: TOTAL ABDOMINAL HYSTERECTOMY WITH BILATERAL SALPINGO-OOPHORECTOMY;  Surgeon: Everett Graff, MD;  Location: [facility];  Service: Gynecology;  Laterality: Bilateral;  . LAPAROSCOPY  04/30/2018   Procedure: LAPAROSCOPY DIAGNOSTIC;  Surgeon: Everett Graff, MD;   Location: [facility];  Service: Gynecology;;  . TUBAL LIGATION       Current Outpatient Medications  Medication Sig Dispense Refill  . aspirin 81 MG tablet Take 81 mg by mouth daily.      . cyanocobalamin (,VITAMIN B-12,) 1000 MCG/ML injection Inject 1 mL (1,000 mcg total) into the muscle every 30 (thirty) days. Reported on 06/28/2015 10 mL 5  . hydrochlorothiazide (HYDRODIURIL) 25 MG tablet TAKE 1 TABLET BY MOUTH DAILY 90 tablet 3  . potassium chloride (K-DUR) 10 MEQ tablet TAKE 2 TABLETS BY MOUTH TWICE DAILY (Patient taking differently: Take 20 mEq by mouth 2 (two) times daily. ) 180 tablet 3   No current facility-administered medications for this visit.     Allergies:   Ace inhibitors; Hydrocodone-acetaminophen; and Oxycodone    Social History:  The patient  reports that she has never smoked. She has never used smokeless tobacco. She reports that she does not drink alcohol or use drugs.   Family History:  The patient's family history includes Anemia in her father; Arthritis in an other family member; Breast cancer in her sister; Cancer in an other family member; Diabetes in an other family member; Heart failure in her mother; Hypertension in an other family member.    ROS:  Please see the history of present illness.  Otherwise, review of systems are positive for positive for some weight loss, occasional tingling in her left hand.   All other systems are reviewed and negative.    PHYSICAL EXAM: VS:  BP 130/84   Pulse 90   Ht 5\' 1"  (1.549 m)   Wt 207 lb (93.9 kg)   LMP  (LMP Unknown)   BMI 39.11 kg/m  , BMI Body mass index is 39.11 kg/m. GENERAL:  Well appearing HEENT:  Pupils equal round and reactive, fundi not visualized, oral mucosa unremarkable NECK:  No jugular venous distention, waveform within normal limits, carotid upstroke brisk and symmetric, no bruits, no thyromegaly LYMPHATICS:  No cervical, inguinal adenopathy LUNGS:  Clear to auscultation bilaterally BACK:  No CVA  tenderness CHEST:  Unremarkable HEART:  PMI not displaced or sustained,S1 and S2 within normal limits, no S3, no S4, no clicks, no rubs, no murmurs ABD:  Flat, positive bowel sounds normal in frequency in pitch, no bruits, no rebound, no guarding, no midline pulsatile mass, no hepatomegaly, no splenomegaly EXT:  2 plus pulses throughout, no edema, no cyanosis no clubbing SKIN:  No rashes no nodules NEURO:  Cranial nerves II through XII grossly intact, motor grossly intact throughout PSYCH:  Cognitively intact, oriented to person place and time    EKG:  EKG is not ordered today. The ekg ordered 04/29/2018 demonstrates sinus rhythm, rate 89, axis within normal limits, intervals within normal limits, no acute ST-T wave changes.   Recent Labs: 04/13/2018: ALT 9; TSH 0.96 05/01/2018: BUN 13; Creatinine, Ser 1.02; Hemoglobin 7.6; Platelets 309; Potassium 4.1; Sodium 136    Lipid Panel    Component Value Date/Time   CHOL 128 04/13/2018 1334   TRIG 57.0 04/13/2018 1334   HDL 42.40 04/13/2018 1334   CHOLHDL 3 04/13/2018 1334   VLDL 11.4 04/13/2018 1334   LDLCALC 75 04/13/2018 1334      Wt Readings from Last 3 Encounters:  06/08/18 207 lb (93.9 kg)  05/29/18 207 lb 2 oz (94 kg)  04/30/18 217 lb 6 oz (98.6 kg)      Other studies Reviewed: Additional studies/ records that were reviewed today include: labs, EKG. Review of the above records demonstrates:  Please see elsewhere in the note.     ASSESSMENT AND PLAN:  JAW PAIN: The patient has some atypical jaw pain.  However, is reasonable to screen with a POET (Plain Old Exercise Treadmill) .  Further testing will be based on these results.  HTN: Her blood pressures well controlled.  She will continue the meds as listed.  Her beta-blocker was recently discontinued and she can still continue without this.  ANEMIA:  She has had significant anemia.  This was treated with transfusion in the hospital.  Follow-up per her primary  provider  Current medicines are reviewed at length with the patient today.  The patient does not have concerns regarding medicines.  The following changes have been made:  no change  Labs/ tests ordered today include:   Orders Placed This Encounter  Procedures  . EXERCISE TOLERANCE TEST (ETT)     Disposition:   FU with me based on the results of the above.      Signed, Minus Breeding, MD  06/08/2018 9:35 PM    [facility]

## 2018-06-08 ENCOUNTER — Ambulatory Visit: Payer: BC Managed Care – PPO | Admitting: Cardiology

## 2018-06-08 ENCOUNTER — Encounter: Payer: Self-pay | Admitting: Cardiology

## 2018-06-08 VITALS — BP 130/84 | HR 90 | Ht 61.0 in | Wt 207.0 lb

## 2018-06-08 DIAGNOSIS — I10 Essential (primary) hypertension: Secondary | ICD-10-CM

## 2018-06-08 DIAGNOSIS — R6884 Jaw pain: Secondary | ICD-10-CM | POA: Diagnosis not present

## 2018-06-08 NOTE — Patient Instructions (Signed)
Medication Instructions:  Continue current medications  If you need a refill on your cardiac medications before your next appointment, please call your pharmacy.  Labwork: None Ordered   Testing/Procedures: Your physician has requested that you have an exercise tolerance test. For further information please visit [email or web address]. Please also follow instruction sheet, as given.   Follow-Up: . Your physician recommends that you schedule a follow-up appointment in: As Needed   At [facility], you and your health needs are our priority.  As part of our continuing mission to provide you with exceptional heart care, we have created designated Provider Care Teams.  These Care Teams include your primary Cardiologist (physician) and Advanced Practice Providers (APPs -  Physician Assistants and Nurse Practitioners) who all work together to provide you with the care you need, when you need it.  Thank you for choosing [facility] at [facility]!!     

## 2018-06-10 ENCOUNTER — Telehealth (HOSPITAL_COMMUNITY): Payer: Self-pay

## 2018-06-10 NOTE — Telephone Encounter (Signed)
Encounter complete. 

## 2018-06-12 ENCOUNTER — Ambulatory Visit (HOSPITAL_COMMUNITY): Payer: BC Managed Care – PPO

## 2018-06-17 ENCOUNTER — Telehealth (HOSPITAL_COMMUNITY): Payer: Self-pay

## 2018-06-17 NOTE — Telephone Encounter (Signed)
Encounter complete. 

## 2018-06-18 ENCOUNTER — Ambulatory Visit (HOSPITAL_COMMUNITY)
Admission: RE | Admit: 2018-06-18 | Discharge: 2018-06-18 | Disposition: A | Discharge status: Home / Self Care | Payer: BC Managed Care – PPO | Source: Ambulatory Visit | Attending: Internal Medicine | Admitting: Internal Medicine

## 2018-06-18 DIAGNOSIS — R6884 Jaw pain: Secondary | ICD-10-CM | POA: Insufficient documentation

## 2018-06-18 LAB — EXERCISE TOLERANCE TEST
Estimated workload: 5.8 METS
Exercise duration (min): 4 min
Exercise duration (sec): 0 s
MPHR: 154 {beats}/min
Peak HR: 169 {beats}/min
Percent HR: 109 %
RPE: 19
Rest HR: 86 {beats}/min

## 2018-07-05 ENCOUNTER — Other Ambulatory Visit: Payer: Self-pay | Admitting: Family Medicine

## 2018-08-13 ENCOUNTER — Ambulatory Visit (INDEPENDENT_AMBULATORY_CARE_PROVIDER_SITE_OTHER): Payer: BC Managed Care – PPO | Admitting: Family Medicine

## 2018-08-13 ENCOUNTER — Encounter: Payer: Self-pay | Admitting: Family Medicine

## 2018-08-13 ENCOUNTER — Other Ambulatory Visit: Payer: Self-pay

## 2018-08-13 ENCOUNTER — Telehealth: Payer: Self-pay | Admitting: Family Medicine

## 2018-08-13 DIAGNOSIS — M109 Gout, unspecified: Secondary | ICD-10-CM | POA: Diagnosis not present

## 2018-08-13 DIAGNOSIS — E538 Deficiency of other specified B group vitamins: Secondary | ICD-10-CM

## 2018-08-13 MED ORDER — METHYLPREDNISOLONE 4 MG PO TBPK: ORAL_TABLET | ORAL | 0 refills | Status: DC

## 2018-08-13 NOTE — Progress Notes (Signed)
Subjective:    Patient ID: Kiara Ramirez, female    DOB: [birth year], 68 y.o.   MRN: [id number]  HPI Virtual Visit via Video Note  I connected with the patient on 08/13/18 at  8:30 AM EDT by a video enabled telemedicine application and verified that I am speaking with the correct person using two identifiers.  Location patient: home Location provider:work or home office Persons participating in the virtual visit: patient, provider  I discussed the limitations of evaluation and management by telemedicine and the availability of in person appointments. The patient expressed understanding and agreed to proceed.   HPI: Here for 2 days of pain in the right great toe. No recent trauma. The toe is red, swollen, and warm. She had an episode just like this last December. It was felt to be gout, and he received a steroid injection into the toe which was very effective. She also asks about supplementing her B12. She had been getting shots but she stopped several months ago. She asks about taking something OTC instead. Her last level was fine at 536 in December.    ROS: See pertinent positives and negatives per HPI.  Past Medical History:  Diagnosis Date  . Anemia   . Anxiety   . 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 meds  . B12 deficiency   . Hypertension     Past Surgical History:  Procedure Laterality Date  . COLONOSCOPY  07/13/2007   per Dr. Fuller Plan, repeat in 5 yrs - polyps  . CYSTOSCOPY  04/30/2018   Procedure: CYSTOSCOPY;  Surgeon: Everett Graff, MD;  Location: [facility];  Service: Gynecology;;  . HYSTERECTOMY ABDOMINAL WITH SALPINGO-OOPHORECTOMY Bilateral 04/30/2018   Procedure: TOTAL ABDOMINAL HYSTERECTOMY WITH BILATERAL SALPINGO-OOPHORECTOMY;  Surgeon: Everett Graff, MD;  Location: [facility];  Service: Gynecology;  Laterality: Bilateral;  . LAPAROSCOPY  04/30/2018   Procedure: LAPAROSCOPY DIAGNOSTIC;  Surgeon: Everett Graff, MD;  Location: [facility];  Service: Gynecology;;  . TUBAL LIGATION       Family History  Problem Relation Age of Onset  . Heart failure Mother        No details  . Anemia Father   . Breast cancer Sister   . Cancer Other        colon/endometrial  . Hypertension Other        family hx  . Arthritis Other        family hx  . Diabetes Other        family hx     Current Outpatient Medications:  .  aspirin 81 MG tablet, Take 81 mg by mouth daily.  , Disp: , Rfl:  .  cyanocobalamin (,VITAMIN B-12,) 1000 MCG/ML injection, Inject 1 mL (1,000 mcg total) into the muscle every 30 (thirty) days. Reported on 06/28/2015, Disp: 10 mL, Rfl: 5 .  hydrochlorothiazide (HYDRODIURIL) 25 MG tablet, Take 1 tablet by mouth daily, Disp: 90 tablet, Rfl: 1 .  potassium chloride (K-DUR) 10 MEQ tablet, TAKE 2 TABLETS BY MOUTH TWICE DAILY (Patient taking differently: Take 20 mEq by mouth 2 (two) times daily. ), Disp: 180 tablet, Rfl: 3  EXAM:  VITALS per patient if applicable:  GENERAL: alert, oriented, appears well and in no acute distress  HEENT: atraumatic, conjunttiva clear, no obvious abnormalities on inspection of external nose and ears  NECK: normal movements of the head and neck  LUNGS: on inspection no signs of respiratory distress, breathing rate appears normal, no obvious gross SOB, gasping or wheezing  CV:  no obvious cyanosis  MS: moves all visible extremities without noticeable abnormality  PSYCH/NEURO: pleasant and cooperative, no obvious depression or anxiety, speech and thought processing grossly intact  ASSESSMENT AND PLAN: This is an acute flare of gout. We will treat this with a Medrol dose pack. The next time she is in the office for a blood draw, we will check a uric acid level. As far as the B12, I suggested she begin to take a 1000 mcg SL tablet daily, and we will check a level at her next blood draw. Alysia Penna, MD  Discussed the following assessment and plan:  No diagnosis found.     I discussed the assessment and treatment plan with the  patient. The patient was provided an opportunity to ask questions and all were answered. The patient agreed with the plan and demonstrated an understanding of the instructions.   The patient was advised to call back or seek an in-person evaluation if the symptoms worsen or if the condition fails to improve as anticipated.     Review of Systems     Objective:   Physical Exam        Assessment & Plan:

## 2018-08-13 NOTE — Telephone Encounter (Signed)
Copied from [place] [phone] [phone]. Topic: Quick Communication - See Telephone Encounter >> Aug 13, 2018 12:09 PM Vernona Rieger wrote: CRM for notification. See Telephone encounter for: 08/13/18.  Patient states Dr Sarajane Jews was going to send her in a prescription for Gout. She just checked with pharmacy and they do not have anything. She just had a visit over the phone this morning. Please advise   [place], [place] - [address] [place] [address] Phone: [phone] Fax: [phone]

## 2018-08-13 NOTE — Telephone Encounter (Signed)
Dr. Sarajane Jews please advise on medication that was to be sent in this morning.

## 2018-08-20 NOTE — Telephone Encounter (Signed)
Dr. Fry please advise. Thanks  

## 2018-08-20 NOTE — Telephone Encounter (Signed)
Pt called stating this medication is not helping her gout any and is requesting to speak to someone. Please advise.   [phone]

## 2018-08-21 MED ORDER — COLCHICINE 0.6 MG PO TABS: 0.6000 mg | ORAL_TABLET | Freq: Two times a day (BID) | ORAL | 5 refills | Status: DC | PRN

## 2018-08-21 NOTE — Telephone Encounter (Signed)
rx has been sent to the pharmacy for the generic colchicine.

## 2018-08-21 NOTE — Addendum Note (Signed)
Addended by: Elie Confer on: 08/21/2018 09:26 AM   Modules accepted: Orders

## 2018-08-21 NOTE — Telephone Encounter (Signed)
Call in [facility] colchicine 0.6 mg to take BID prn gout, #30 with 5 rf

## 2018-10-27 ENCOUNTER — Other Ambulatory Visit: Payer: Self-pay | Admitting: Family Medicine

## 2018-11-03 ENCOUNTER — Other Ambulatory Visit: Payer: Self-pay

## 2018-11-03 MED ORDER — POTASSIUM CHLORIDE ER 10 MEQ PO TBCR: 20.0000 meq | EXTENDED_RELEASE_TABLET | Freq: Two times a day (BID) | ORAL | 3 refills | Status: DC

## 2018-11-19 ENCOUNTER — Encounter: Payer: Self-pay | Admitting: Gastroenterology

## 2018-12-02 ENCOUNTER — Encounter: Payer: BC Managed Care – PPO | Admitting: Gastroenterology

## 2018-12-07 ENCOUNTER — Other Ambulatory Visit: Payer: Self-pay

## 2018-12-07 ENCOUNTER — Ambulatory Visit (AMBULATORY_SURGERY_CENTER): Payer: Self-pay

## 2018-12-07 VITALS — Ht 62.0 in | Wt 198.8 lb

## 2018-12-07 DIAGNOSIS — Z86010 Personal history of colonic polyps: Secondary | ICD-10-CM

## 2018-12-07 MED ORDER — NA SULFATE-K SULFATE-MG SULF 17.5-3.13-1.6 GM/177ML PO SOLN: 1.0000 | Freq: Once | ORAL | 0 refills | Status: AC

## 2018-12-07 NOTE — Progress Notes (Signed)
Denies allergies to eggs or soy products. Denies complication of anesthesia or sedation. Denies use of weight loss medication. Denies use of O2.   Emmi instructions given for colonoscopy.  A 15.00 coupon for Suprep was given to the patient.  

## 2018-12-09 ENCOUNTER — Encounter: Payer: Self-pay | Admitting: Gastroenterology

## 2018-12-14 ENCOUNTER — Telehealth (INDEPENDENT_AMBULATORY_CARE_PROVIDER_SITE_OTHER): Payer: BC Managed Care – PPO | Admitting: Family Medicine

## 2018-12-14 ENCOUNTER — Encounter: Payer: Self-pay | Admitting: Family Medicine

## 2018-12-14 ENCOUNTER — Other Ambulatory Visit: Payer: Self-pay

## 2018-12-14 ENCOUNTER — Telehealth: Payer: Self-pay | Admitting: Family Medicine

## 2018-12-14 DIAGNOSIS — M109 Gout, unspecified: Secondary | ICD-10-CM

## 2018-12-14 NOTE — Telephone Encounter (Signed)
Patient believes she has gout and would like to come in to see PCP but patient has colonoscopy scheduled for this wed 9/2 and has already started the procces and would like to know if PCP wants to wait to see her to after, Patient call back [phone]

## 2018-12-14 NOTE — Progress Notes (Signed)
Virtual Visit via Video Note  I connected with the patient on 12/14/18 at  2:00 PM EDT by a video enabled telemedicine application and verified that I am speaking with the correct person using two identifiers.  Location patient: home Location provider:work or home office Persons participating in the virtual visit: patient, provider  I discussed the limitations of evaluation and management by telemedicine and the availability of in person appointments. The patient expressed understanding and agreed to proceed.   HPI: Here for the onset yesterday of swelling and redness and pain in the left forefoot. No recent trauma. She has gout attacks, usually in the great toe. Of note she is scheduled for a colonoscopy later this week.    ROS: See pertinent positives and negatives per HPI.  Past Medical History:  Diagnosis Date  . Allergy   . Anemia   . Anxiety   . 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 meds  . B12 deficiency   . GERD (gastroesophageal reflux disease)   . Gout   . Hypertension     Past Surgical History:  Procedure Laterality Date  . COLONOSCOPY  07/13/2007   per Dr. Fuller Plan, repeat in 5 yrs - polyps  . CYSTOSCOPY  04/30/2018   Procedure: CYSTOSCOPY;  Surgeon: Everett Graff, MD;  Location: [facility];  Service: Gynecology;;  . HYSTERECTOMY ABDOMINAL WITH SALPINGO-OOPHORECTOMY Bilateral 04/30/2018   Procedure: TOTAL ABDOMINAL HYSTERECTOMY WITH BILATERAL SALPINGO-OOPHORECTOMY;  Surgeon: Everett Graff, MD;  Location: [facility];  Service: Gynecology;  Laterality: Bilateral;  . LAPAROSCOPY  04/30/2018   Procedure: LAPAROSCOPY DIAGNOSTIC;  Surgeon: Everett Graff, MD;  Location: [facility];  Service: Gynecology;;  . TUBAL LIGATION      Family History  Problem Relation Age of Onset  . Heart failure Mother        No details  . Anemia Father   . Breast cancer Sister   . Cancer Other        colon/endometrial  . Hypertension Other        family hx  . Arthritis Other        family hx  . Diabetes  Other        family hx  . Colon cancer Neg Hx   . Esophageal cancer Neg Hx   . Stomach cancer Neg Hx   . Rectal cancer Neg Hx      Current Outpatient Medications:  .  aspirin 81 MG tablet, Take 81 mg by mouth daily.  , Disp: , Rfl:  .  cyanocobalamin (,VITAMIN B-12,) 1000 MCG/ML injection, Inject 1 mL (1,000 mcg total) into the muscle every 30 (thirty) days. Reported on 06/28/2015, Disp: 10 mL, Rfl: 5 .  hydrochlorothiazide (HYDRODIURIL) 25 MG tablet, Take 1 tablet by mouth daily, Disp: 90 tablet, Rfl: 1 .  potassium chloride (K-DUR) 10 MEQ tablet, Take 2 tablets (20 mEq total) by mouth 2 (two) times daily., Disp: 180 tablet, Rfl: 3  EXAM:  VITALS per patient if applicable:  GENERAL: alert, oriented, appears well and in no acute distress  HEENT: atraumatic, conjunttiva clear, no obvious abnormalities on inspection of external nose and ears  NECK: normal movements of the head and neck  LUNGS: on inspection no signs of respiratory distress, breathing rate appears normal, no obvious gross SOB, gasping or wheezing  CV: no obvious cyanosis  MS: moves all visible extremities without noticeable abnormality  PSYCH/NEURO: pleasant and cooperative, no obvious depression or anxiety, speech and thought processing grossly intact  ASSESSMENT AND PLAN: Gout,  treat with a Medrol dose pack.  Alysia Penna, MD  Discussed the following assessment and plan:  Acute gout of left foot, unspecified cause     I discussed the assessment and treatment plan with the patient. The patient was provided an opportunity to ask questions and all were answered. The patient agreed with the plan and demonstrated an understanding of the instructions.   The patient was advised to call back or seek an in-person evaluation if the symptoms worsen or if the condition fails to improve as anticipated.

## 2018-12-14 NOTE — Telephone Encounter (Signed)
Patient has been scheduled for a virtual appointment this afternoon at 2pm. Will send to Dr. Sarajane Jews as FYI>

## 2018-12-14 NOTE — Telephone Encounter (Signed)
See note

## 2018-12-15 ENCOUNTER — Telehealth: Payer: Self-pay

## 2018-12-15 NOTE — Telephone Encounter (Signed)
Covid-19 screening questions   Do you now or have you had a fever in the last 14 days? NO   Do you have any respiratory symptoms of shortness of breath or cough now or in the last 14 days? NO  Do you have any family members or close contacts with diagnosed or suspected Covid-19 in the past 14 days? NO  Have you been tested for Covid-19 and found to be positive? NO        

## 2018-12-16 ENCOUNTER — Encounter: Payer: Self-pay | Admitting: Gastroenterology

## 2018-12-16 ENCOUNTER — Other Ambulatory Visit: Payer: Self-pay

## 2018-12-16 ENCOUNTER — Ambulatory Visit (AMBULATORY_SURGERY_CENTER): Payer: BC Managed Care – PPO | Admitting: Gastroenterology

## 2018-12-16 VITALS — BP 118/68 | HR 63 | Temp 97.6°F | Resp 16 | Ht 62.0 in | Wt 198.0 lb

## 2018-12-16 DIAGNOSIS — Z86010 Personal history of colonic polyps: Secondary | ICD-10-CM

## 2018-12-16 HISTORY — PX: COLONOSCOPY: SHX174

## 2018-12-16 MED ORDER — SODIUM CHLORIDE 0.9 % IV SOLN: 500.0000 mL | Freq: Once | INTRAVENOUS | Status: DC

## 2018-12-16 NOTE — Progress Notes (Signed)
GI office to schedule air contrast barium enema in 1 month per Dr. Lynne Leader procedure sheet.

## 2018-12-16 NOTE — Progress Notes (Signed)
Report to PACU, RN, vss, BBS= Clear.  

## 2018-12-16 NOTE — Patient Instructions (Signed)
YOU HAD AN ENDOSCOPIC PROCEDURE TODAY AT THE [facility]:   Refer to the procedure report that was given to you for any specific questions about what was found during the examination.  If the procedure report does not answer your questions, please call your gastroenterologist to clarify.  If you requested that your care partner not be given the details of your procedure findings, then the procedure report has been included in a sealed envelope for you to review at your convenience later.  YOU SHOULD EXPECT: Some feelings of bloating in the abdomen. Passage of more gas than usual.  Walking can help get rid of the air that was put into your GI tract during the procedure and reduce the bloating. If you had a lower endoscopy (such as a colonoscopy or flexible sigmoidoscopy) you may notice spotting of blood in your stool or on the toilet paper. If you underwent a bowel prep for your procedure, you may not have a normal bowel movement for a few days.  Please Note:  You might notice some irritation and congestion in your nose or some drainage.  This is from the oxygen used during your procedure.  There is no need for concern and it should clear up in a day or so.  SYMPTOMS TO REPORT IMMEDIATELY:   Following lower endoscopy (colonoscopy or flexible sigmoidoscopy):  Excessive amounts of blood in the stool  Significant tenderness or worsening of abdominal pains  Swelling of the abdomen that is new, acute  Fever of 100F or higher  For urgent or emergent issues, a gastroenterologist can be reached at any hour by calling [phone].   DIET:  We do recommend a small meal at first, but then you may proceed to your regular diet.  Drink plenty of fluids but you should avoid alcoholic beverages for 24 hours.  ACTIVITY:  You should plan to take it easy for the rest of today and you should NOT DRIVE or use heavy machinery until tomorrow (because of the sedation medicines used during the test).     FOLLOW UP: Our staff will call the number listed on your records 48-72 hours following your procedure to check on you and address any questions or concerns that you may have regarding the information given to you following your procedure. If we do not reach you, we will leave a message.  We will attempt to reach you two times.  During this call, we will ask if you have developed any symptoms of COVID 19. If you develop any symptoms (ie: fever, flu-like symptoms, shortness of breath, cough etc.) before then, please call [phone].  If you test positive for Covid 19 in the 2 weeks post procedure, please call and report this information to us.    If any biopsies were taken you will be contacted by phone or by letter within the next 1-3 weeks.  Please call us at [phone] if you have not heard about the biopsies in 3 weeks.    SIGNATURES/CONFIDENTIALITY: You and/or your care partner have signed paperwork which will be entered into your electronic medical record.  These signatures attest to the fact that that the information above on your After Visit Summary has been reviewed and is understood.  Full responsibility of the confidentiality of this discharge information lies with you and/or your care-partner. 

## 2018-12-16 NOTE — Progress Notes (Signed)
Pt's states no medical or surgical changes since previsit or office visit.  [place]

## 2018-12-16 NOTE — Op Note (Signed)
[place] Patient Name: Kiara Ramirez Procedure Date: 12/16/2018 10:42 AM MRN: [id number] Endoscopist: Ladene Artist , MD Age: 68 Referring MD:  Date of Birth: [birth year] Gender: Female Account #: [id number] Procedure:                Colonoscopy Indications:              Surveillance: Personal history of adenomatous                            polyps on last colonoscopy > 5 years ago Medicines:                Monitored Anesthesia Care Procedure:                Pre-Anesthesia Assessment:                           - Prior to the procedure, a History and Physical                            was performed, and patient medications and                            allergies were reviewed. The patient's tolerance of                            previous anesthesia was also reviewed. The risks                            and benefits of the procedure and the sedation                            options and risks were discussed with the patient.                            All questions were answered, and informed consent                            was obtained. Prior Anticoagulants: The patient has                            taken no previous anticoagulant or antiplatelet                            agents. ASA Grade Assessment: II - A patient with                            mild systemic disease. After reviewing the risks                            and benefits, the patient was deemed in                            satisfactory condition to undergo the procedure.  After obtaining informed consent, the colonoscope                            was passed under direct vision. Throughout the                            procedure, the patient's blood pressure, pulse, and                            oxygen saturations were monitored continuously. The                            Colonoscope was introduced through the anus with                            the intention of advancing  to the cecum. The scope                            was advanced to the sigmoid colon before the                            procedure was aborted. Medications were not given.                            The Peds colonoscope and then the adult endoscope                            were introduced through the anus with the intention                            of advancing to the cecum. The scopes were advanced                            to the sigmoid colon before the procedure was                            aborted. Medications were not given. The rectum was                            photographed. The quality of the bowel preparation                            was good. The patient tolerated the procedure well.                            The colonoscopy was technically difficult and                            complex due to restricted mobility of the sigmoid                            colon and a tortuous colon. Scope In: 10:57:20 AM Scope Out: 11:18:39 AM  Total Procedure Duration: 0 hours 21 minutes 19 seconds  Findings:                 The perianal and digital rectal examinations were                            normal.                           A few medium-mouthed diverticula were found in the                            sigmoid colon. There was no evidence of                            diverticular bleeding.                           Internal hemorrhoids were found during                            retroflexion. The hemorrhoids were small and Grade                            I (internal hemorrhoids that do not prolapse).                           The exam was otherwise without abnormality on                            direct and retroflexion views. Complications:            No immediate complications. Estimated blood loss:                            None. Estimated Blood Loss:     Estimated blood loss: none. Impression:               - Incomplete colonoscopy to sigmoid colon.                            - Mild diverticulosis in the sigmoid colon.                           - Internal hemorrhoids.                           - The examination was otherwise normal on direct                            and retroflexion views.                           - No specimens collected. Recommendation:           - Patient has a contact number available for  emergencies. The signs and symptoms of potential                            delayed complications were discussed with the                            patient. Return to normal activities tomorrow.                            Written discharge instructions were provided to the                            patient.                           - Resume previous diet.                           - Continue present medications.                           - Perform an air contrast barium enema in 1 month. Ladene Artist, MD 12/16/2018 11:26:29 AM This report has been signed electronically.

## 2018-12-18 ENCOUNTER — Telehealth: Payer: Self-pay

## 2018-12-18 NOTE — Telephone Encounter (Signed)
  Follow up Call-  Call back number 12/16/2018  Post procedure Call Back phone  # [phone]  Permission to leave phone message Yes  Some recent data might be hidden     Patient questions:  Do you have a fever, pain , or abdominal swelling? No. Pain Score  0 *  Have you tolerated food without any problems? Yes.    Have you been able to return to your normal activities? Yes.    Do you have any questions about your discharge instructions: Diet   No. Medications  No. Follow up visit  No.  Do you have questions or concerns about your Care? No.  Actions: * If pain score is 4 or above: No action needed, pain <4. 1. Have you developed a fever since your procedure? no  2.   Have you had an respiratory symptoms (SOB or cough) since your procedure? no  3.   Have you tested positive for COVID 19 since your procedure no  4.   Have you had any family members/close contacts diagnosed with the COVID 19 since your procedure?  no   If yes to any of these questions please route to Joylene John, RN and Alphonsa Gin, Therapist, sports.

## 2018-12-22 ENCOUNTER — Other Ambulatory Visit: Payer: Self-pay

## 2018-12-22 DIAGNOSIS — Z86010 Personal history of colonic polyps: Secondary | ICD-10-CM

## 2018-12-22 NOTE — Progress Notes (Signed)
Patient is notified of the BE scheduled at [facility] for 01/18/19.  She is asked to call me if she has questions when she receives her instructions in the mail.  Procedure explained and all questions answered

## 2019-01-18 ENCOUNTER — Other Ambulatory Visit: Payer: Self-pay

## 2019-01-18 ENCOUNTER — Ambulatory Visit (HOSPITAL_COMMUNITY)
Admission: RE | Admit: 2019-01-18 | Discharge: 2019-01-18 | Disposition: A | Discharge status: Home / Self Care | Payer: BC Managed Care – PPO | Source: Ambulatory Visit | Attending: Gastroenterology | Admitting: Gastroenterology

## 2019-01-18 DIAGNOSIS — Z86010 Personal history of colonic polyps: Secondary | ICD-10-CM | POA: Insufficient documentation

## 2019-01-18 HISTORY — PX: DG BARIUM ENEMA (ARMC HX): HXRAD1447

## 2019-03-03 ENCOUNTER — Ambulatory Visit (INDEPENDENT_AMBULATORY_CARE_PROVIDER_SITE_OTHER): Payer: BC Managed Care – PPO | Admitting: Family Medicine

## 2019-03-03 ENCOUNTER — Encounter: Payer: Self-pay | Admitting: Family Medicine

## 2019-03-03 ENCOUNTER — Other Ambulatory Visit: Payer: Self-pay

## 2019-03-03 VITALS — BP 120/70 | HR 73 | Temp 97.7°F | Ht 62.0 in | Wt 199.0 lb

## 2019-03-03 DIAGNOSIS — M542 Cervicalgia: Secondary | ICD-10-CM

## 2019-03-03 MED ORDER — CYCLOBENZAPRINE HCL 10 MG PO TABS: 10.0000 mg | ORAL_TABLET | Freq: Three times a day (TID) | ORAL | 2 refills | Status: DC | PRN

## 2019-03-03 NOTE — Progress Notes (Signed)
   Subjective:    Patient ID: Kiara Ramirez, female    DOB: August 10, 1950, 68 y.o.   MRN: [id number]  HPI Here for one month of intermittent stiffness and pain in the left side of the neck that runs down the left arm. No recent trauma, but she works from home on her computer, and she spends hours at a time. Sometimes the left arm goes numb. Heat helps. She has adjusted her chair and desk with some improvement.    Review of Systems  Constitutional: Negative.   Cardiovascular: Negative.   Musculoskeletal: Positive for neck pain and neck stiffness.  Neurological: Positive for numbness. Negative for weakness.       Objective:   Physical Exam Constitutional:      General: She is not in acute distress.    Appearance: Normal appearance.  Cardiovascular:     Rate and Rhythm: Normal rate and regular rhythm.     Pulses: Normal pulses.     Heart sounds: Normal heart sounds.  Pulmonary:     Effort: Pulmonary effort is normal.     Breath sounds: Normal breath sounds.  Musculoskeletal:     Comments: Mildly tender in the left neck and the upper left trapezius. ROM is full   Neurological:     Mental Status: She is alert.           Assessment & Plan:  Neck pain likely due to a pinched nerve. I asked her to get up and move around every so often when working at her computer. Use moist heat. Take Aleve bid and add Flexeril as needed.  Alysia Penna, MD

## 2019-04-19 ENCOUNTER — Telehealth: Payer: Self-pay

## 2019-04-19 ENCOUNTER — Ambulatory Visit (INDEPENDENT_AMBULATORY_CARE_PROVIDER_SITE_OTHER): Payer: BC Managed Care – PPO | Admitting: Family Medicine

## 2019-04-19 ENCOUNTER — Other Ambulatory Visit: Payer: Self-pay

## 2019-04-19 ENCOUNTER — Encounter: Payer: Self-pay | Admitting: Family Medicine

## 2019-04-19 VITALS — BP 130/70 | HR 92 | Temp 97.7°F | Wt 201.8 lb

## 2019-04-19 DIAGNOSIS — Z0000 Encounter for general adult medical examination without abnormal findings: Secondary | ICD-10-CM

## 2019-04-19 DIAGNOSIS — E538 Deficiency of other specified B group vitamins: Secondary | ICD-10-CM

## 2019-04-19 DIAGNOSIS — Z23 Encounter for immunization: Secondary | ICD-10-CM | POA: Diagnosis not present

## 2019-04-19 LAB — LIPID PANEL
Cholesterol: 126 mg/dL (ref 0–200)
HDL: 41 mg/dL (ref >39.00)
LDL Cholesterol: 74 mg/dL (ref 0–99)
NonHDL: 85.46
Total CHOL/HDL Ratio: 3
Triglycerides: 57 mg/dL (ref 0.0–149.0)
VLDL: 11.4 mg/dL (ref 0.0–40.0)

## 2019-04-19 LAB — CBC WITH DIFFERENTIAL/PLATELET
Basophils Absolute: 0 10*3/uL (ref 0.0–0.1)
Basophils Relative: 0.3 % (ref 0.0–3.0)
Eosinophils Absolute: 0 10*3/uL (ref 0.0–0.7)
Eosinophils Relative: 0.8 % (ref 0.0–5.0)
HCT: 36.7 % (ref 36.0–46.0)
Hemoglobin: 12 g/dL (ref 12.0–15.0)
Lymphocytes Relative: 25.6 % (ref 12.0–46.0)
Lymphs Abs: 1.5 10*3/uL (ref 0.7–4.0)
MCHC: 32.8 g/dL (ref 30.0–36.0)
MCV: 86.9 fl (ref 78.0–100.0)
Monocytes Absolute: 0.4 10*3/uL (ref 0.1–1.0)
Monocytes Relative: 6.1 % (ref 3.0–12.0)
Neutro Abs: 3.9 10*3/uL (ref 1.4–7.7)
Neutrophils Relative %: 67.2 % (ref 43.0–77.0)
Platelets: 341 10*3/uL (ref 150.0–400.0)
RBC: 4.23 Mil/uL (ref 3.87–5.11)
RDW: 14.7 % (ref 11.5–15.5)
WBC: 5.8 10*3/uL (ref 4.0–10.5)

## 2019-04-19 LAB — HEPATIC FUNCTION PANEL
ALT: 8 U/L (ref 0–35)
AST: 12 U/L (ref 0–37)
Albumin: 3.5 g/dL (ref 3.5–5.2)
Alkaline Phosphatase: 95 U/L (ref 39–117)
Bilirubin, Direct: 0.1 mg/dL (ref 0.0–0.3)
Total Bilirubin: 0.3 mg/dL (ref 0.2–1.2)
Total Protein: 7.3 g/dL (ref 6.0–8.3)

## 2019-04-19 LAB — BASIC METABOLIC PANEL
BUN: 11 mg/dL (ref 6–23)
CO2: 32 mEq/L (ref 19–32)
Calcium: 9.1 mg/dL (ref 8.4–10.5)
Chloride: 99 mEq/L (ref 96–112)
Creatinine, Ser: 0.78 mg/dL (ref 0.40–1.20)
GFR: 89.02 mL/min (ref >60.00)
Glucose, Bld: 89 mg/dL (ref 70–99)
Potassium: 3.9 mEq/L (ref 3.5–5.1)
Sodium: 140 mEq/L (ref 135–145)

## 2019-04-19 LAB — VITAMIN B12: Vitamin B-12: 297 pg/mL (ref 211–911)

## 2019-04-19 LAB — TSH: TSH: 1 u[IU]/mL (ref 0.35–4.50)

## 2019-04-19 MED ORDER — HYDROCHLOROTHIAZIDE 25 MG PO TABS: 25.0000 mg | ORAL_TABLET | Freq: Every day | ORAL | 3 refills | Status: DC

## 2019-04-19 NOTE — Telephone Encounter (Signed)
Reminder placed for 5 years

## 2019-04-19 NOTE — Progress Notes (Signed)
   Subjective:    Patient ID: Kiara Ramirez, female    DOB: [birth year], 69 y.o.   MRN: [id number]  HPI Here for a well exam. She feels fine. She had a colonoscopy in September which was incomplete, and then she had a barium enema in October which was also incomplete. Apparently no follow plans were recommended so far.    Review of Systems  Constitutional: Negative.   HENT: Negative.   Eyes: Negative.   Respiratory: Negative.   Cardiovascular: Negative.   Gastrointestinal: Negative.   Genitourinary: Negative for decreased urine volume, difficulty urinating, dyspareunia, dysuria, enuresis, flank pain, frequency, hematuria, pelvic pain and urgency.  Musculoskeletal: Negative.   Skin: Negative.   Neurological: Negative.   Psychiatric/Behavioral: Negative.        Objective:   Physical Exam Constitutional:      General: She is not in acute distress.    Appearance: She is well-developed.  HENT:     Head: Normocephalic and atraumatic.     Right Ear: External ear normal.     Left Ear: External ear normal.     Nose: Nose normal.     Mouth/Throat:     Pharynx: No oropharyngeal exudate.  Eyes:     General: No scleral icterus.    Conjunctiva/sclera: Conjunctivae normal.     Pupils: Pupils are equal, round, and reactive to light.  Neck:     Thyroid: No thyromegaly.     Vascular: No JVD.  Cardiovascular:     Rate and Rhythm: Normal rate and regular rhythm.     Heart sounds: Normal heart sounds. No murmur. No friction rub. No gallop.   Pulmonary:     Effort: Pulmonary effort is normal. No respiratory distress.     Breath sounds: Normal breath sounds. No wheezing or rales.  Chest:     Chest wall: No tenderness.  Abdominal:     General: Bowel sounds are normal. There is no distension.     Palpations: Abdomen is soft. There is no mass.     Tenderness: There is no abdominal tenderness. There is no guarding or rebound.  Musculoskeletal:        General: No tenderness. Normal range of  motion.     Cervical back: Normal range of motion and neck supple.  Lymphadenopathy:     Cervical: No cervical adenopathy.  Skin:    General: Skin is warm and dry.     Findings: No erythema or rash.  Neurological:     Mental Status: She is alert and oriented to person, place, and time.     Cranial Nerves: No cranial nerve deficit.     Motor: No abnormal muscle tone.     Coordination: Coordination normal.     Deep Tendon Reflexes: Reflexes are normal and symmetric. Reflexes normal.  Psychiatric:        Behavior: Behavior normal.        Thought Content: Thought content normal.        Judgment: Judgment normal.           Assessment & Plan:  Well exam. We discussed diet and exercise. Get fasting labs. Given a prevnar 13 vaccine. We will contact Dr. Fuller Plan to see what type of GI screening follow up he would recommend.  Alysia Penna, MD

## 2019-04-19 NOTE — Addendum Note (Signed)
Addended by: Rebecca Eaton on: 04/19/2019 09:32 AM   Modules accepted: Orders

## 2019-04-19 NOTE — Telephone Encounter (Signed)
-----   Message from Ladene Artist, MD sent at 04/19/2019 12:59 PM EST ----- Alfonso Patten, Holidays were very nice. I hope yours was very good as well. Colonoscopy to sigmoid colon and then a limited BE study however no polyps or masses were noted. We recommended a CT colonoscopy in 5 years for her history of colon polyps. We will put this recall in our system. Thanks, Norberto Sorenson  ----- Message ----- From: Laurey Morale, MD Sent: 04/19/2019   9:34 AM EST To: Ladene Artist, MD  Hello Norberto Sorenson, hope you had a nice holiday. I see that Jorgina had a colonoscopy in September and a BE in October, both of which had incomplete results. When should she resume GI screening?  Thanks, Richardson Landry

## 2019-06-03 ENCOUNTER — Emergency Department (HOSPITAL_COMMUNITY): Payer: BC Managed Care – PPO

## 2019-06-03 ENCOUNTER — Other Ambulatory Visit: Payer: Self-pay

## 2019-06-03 ENCOUNTER — Emergency Department (HOSPITAL_COMMUNITY)
Admission: EM | Admit: 2019-06-03 | Discharge: 2019-06-03 | Disposition: A | Discharge status: Home / Self Care | Payer: BC Managed Care – PPO | Attending: Emergency Medicine | Admitting: Emergency Medicine

## 2019-06-03 ENCOUNTER — Encounter (HOSPITAL_COMMUNITY): Payer: Self-pay | Admitting: Student

## 2019-06-03 DIAGNOSIS — Z7982 Long term (current) use of aspirin: Secondary | ICD-10-CM | POA: Insufficient documentation

## 2019-06-03 DIAGNOSIS — Z79899 Other long term (current) drug therapy: Secondary | ICD-10-CM | POA: Diagnosis not present

## 2019-06-03 DIAGNOSIS — R079 Chest pain, unspecified: Secondary | ICD-10-CM | POA: Diagnosis not present

## 2019-06-03 DIAGNOSIS — I10 Essential (primary) hypertension: Secondary | ICD-10-CM | POA: Insufficient documentation

## 2019-06-03 LAB — CBC WITH DIFFERENTIAL/PLATELET
Abs Immature Granulocytes: 0.02 10*3/uL (ref 0.00–0.07)
Basophils Absolute: 0 10*3/uL (ref 0.0–0.1)
Basophils Relative: 0 %
Eosinophils Absolute: 0.1 10*3/uL (ref 0.0–0.5)
Eosinophils Relative: 1 %
HCT: 36.7 % (ref 36.0–46.0)
Hemoglobin: 11.6 g/dL — ABNORMAL LOW (ref 12.0–15.0)
Immature Granulocytes: 0 %
Lymphocytes Relative: 16 %
Lymphs Abs: 1.4 10*3/uL (ref 0.7–4.0)
MCH: 28.5 pg (ref 26.0–34.0)
MCHC: 31.6 g/dL (ref 30.0–36.0)
MCV: 90.2 fL (ref 80.0–100.0)
Monocytes Absolute: 0.4 10*3/uL (ref 0.1–1.0)
Monocytes Relative: 5 %
Neutro Abs: 7 10*3/uL (ref 1.7–7.7)
Neutrophils Relative %: 78 %
Platelets: 357 10*3/uL (ref 150–400)
RBC: 4.07 MIL/uL (ref 3.87–5.11)
RDW: 14.3 % (ref 11.5–15.5)
WBC: 9 10*3/uL (ref 4.0–10.5)
nRBC: 0 % (ref 0.0–0.2)

## 2019-06-03 LAB — COMPREHENSIVE METABOLIC PANEL
ALT: 11 U/L (ref 0–44)
AST: 15 U/L (ref 15–41)
Albumin: 2.8 g/dL — ABNORMAL LOW (ref 3.5–5.0)
Alkaline Phosphatase: 76 U/L (ref 38–126)
Anion gap: 8 (ref 5–15)
BUN: 14 mg/dL (ref 8–23)
CO2: 28 mmol/L (ref 22–32)
Calcium: 8.5 mg/dL — ABNORMAL LOW (ref 8.9–10.3)
Chloride: 101 mmol/L (ref 98–111)
Creatinine, Ser: 0.97 mg/dL (ref 0.44–1.00)
GFR calc Af Amer: >60 mL/min (ref >60)
GFR calc non Af Amer: >60 mL/min (ref >60)
Glucose, Bld: 108 mg/dL — ABNORMAL HIGH (ref 70–99)
Potassium: 3.4 mmol/L — ABNORMAL LOW (ref 3.5–5.1)
Sodium: 137 mmol/L (ref 135–145)
Total Bilirubin: 0.5 mg/dL (ref 0.3–1.2)
Total Protein: 7.3 g/dL (ref 6.5–8.1)

## 2019-06-03 LAB — TROPONIN I (HIGH SENSITIVITY)
Troponin I (High Sensitivity): <2 ng/L (ref <18)
Troponin I (High Sensitivity): 3 ng/L (ref <18)

## 2019-06-03 MED ORDER — FAMOTIDINE IN NACL 20-0.9 MG/50ML-% IV SOLN
20.0000 mg | Freq: Once | INTRAVENOUS | Status: AC
  Administered 2019-06-03: 20 mg via INTRAVENOUS
  Filled 2019-06-03: qty 50

## 2019-06-03 MED ORDER — OMEPRAZOLE 20 MG PO CPDR: 20.0000 mg | DELAYED_RELEASE_CAPSULE | Freq: Every day | ORAL | 0 refills | Status: DC

## 2019-06-03 MED ORDER — LIDOCAINE VISCOUS HCL 2 % MT SOLN
15.0000 mL | Freq: Once | OROMUCOSAL | Status: AC
  Administered 2019-06-03: 11:00:00 15 mL via ORAL
  Filled 2019-06-03: qty 15

## 2019-06-03 MED ORDER — ALUM & MAG HYDROXIDE-SIMETH 200-200-20 MG/5ML PO SUSP
30.0000 mL | Freq: Once | ORAL | Status: AC
  Administered 2019-06-03: 11:00:00 30 mL via ORAL
  Filled 2019-06-03: qty 30

## 2019-06-03 NOTE — ED Triage Notes (Signed)
Pt presents with non radiating mid-sternum chest pain starting at 0500. Pt also endorses some indigestion. denies SOB or numbness.

## 2019-06-03 NOTE — ED Notes (Signed)
Patient verbalizes understanding of discharge instructions. Opportunity for questioning and answers were provided. Armband removed by staff, pt discharged from ED.  

## 2019-06-03 NOTE — ED Provider Notes (Signed)
[facility] EMERGENCY DEPARTMENT Provider Note   CSN: [id number] Arrival date & time: 06/03/19  [id number]     History Chief Complaint  Patient presents with  . Chest Pain    Kiara Ramirez is a 69 y.o. female with a history of GERD, anemia, anxiety, hypertension, & B12 deficiency who presents to the ED with complaints of chest pain that began @ 0500 this AM when she woke up. Patient states pain is a nagging type discomfort located in the anterior substernal chest which occasionally radiates into her throat some. States pain is intermittent, none at present, no alleviating/aggravating factors, no change with exertion. States she has had associated frequent belching. Denies hx of similar. Denies fever, chills, nausea, vomiting, diaphoresis, dyspnea, lightheadedness, dizziness, syncope, abdominal pain, lkeg pain/swelling, hemoptysis, recent surgery/trauma, recent long travel, hormone use, personal hx of cancer, or hx of DVT/PE. Ate collard greens for dinner last night.  HPI     Past Medical History:  Diagnosis Date  . Allergy   . Anemia   . Anxiety   . 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 meds  . B12 deficiency   . GERD (gastroesophageal reflux disease)   . Gout   . Hypertension     Patient Active Problem List   Diagnosis Date Noted  . Gout 08/13/2018  . Postmenopausal bleeding 04/30/2018  . Post-menopausal bleeding 04/30/2018  . Chronic pain of left knee 10/06/2017  . HYPERTROPHY OF TONSILS ALONE 03/07/2008  . ANXIETY 04/20/2007  . Weakness generalized 04/01/2007  . B12 deficiency 02/25/2007  . Essential hypertension 01/23/2007  . HEADACHE 01/23/2007    Past Surgical History:  Procedure Laterality Date  . COLONOSCOPY  12/16/2018   per Dr. Fuller Plan, incomplete exam    . CYSTOSCOPY  04/30/2018   Procedure: CYSTOSCOPY;  Surgeon: Everett Graff, MD;  Location: [facility];  Service: Gynecology;;  . Darletta Moll BARIUM ENEMA ([place] HX)  01/18/2019   per Dr. Fuller Plan, incomplete exam   .  HYSTERECTOMY ABDOMINAL WITH SALPINGO-OOPHORECTOMY Bilateral 04/30/2018   Procedure: TOTAL ABDOMINAL HYSTERECTOMY WITH BILATERAL SALPINGO-OOPHORECTOMY;  Surgeon: Everett Graff, MD;  Location: [facility];  Service: Gynecology;  Laterality: Bilateral;  . LAPAROSCOPY  04/30/2018   Procedure: LAPAROSCOPY DIAGNOSTIC;  Surgeon: Everett Graff, MD;  Location: [facility];  Service: Gynecology;;  . TUBAL LIGATION       OB History   No obstetric history on file.     Family History  Problem Relation Age of Onset  . Heart failure Mother        No details  . Anemia Father   . Breast cancer Sister   . Cancer Other        colon/endometrial  . Hypertension Other        family hx  . Arthritis Other        family hx  . Diabetes Other        family hx  . Colon cancer Neg Hx   . Esophageal cancer Neg Hx   . Stomach cancer Neg Hx   . Rectal cancer Neg Hx     Social History   Tobacco Use  . Smoking status: Never Smoker  . Smokeless tobacco: Never Used  Substance Use Topics  . Alcohol use: No    Alcohol/week: 0.0 standard drinks  . Drug use: No    Home Medications Prior to Admission medications   Medication Sig Start Date End Date Taking? Authorizing Provider  aspirin 81 MG tablet Take 81 mg by mouth  daily.      [provider]  cyanocobalamin (,VITAMIN B-12,) 1000 MCG/ML injection Inject 1 mL (1,000 mcg total) into the muscle every 30 (thirty) days. Reported on 06/28/2015 05/05/17   Laurey Morale, MD  cyclobenzaprine (FLEXERIL) 10 MG tablet Take 1 tablet (10 mg total) by mouth 3 (three) times daily as needed for muscle spasms. 03/03/19   Laurey Morale, MD  hydrochlorothiazide (HYDRODIURIL) 25 MG tablet Take 1 tablet (25 mg total) by mouth daily. 04/19/19   Laurey Morale, MD  potassium chloride (K-DUR) 10 MEQ tablet Take 2 tablets (20 mEq total) by mouth 2 (two) times daily. 11/03/18   Laurey Morale, MD    Allergies    Ace inhibitors, Hydrocodone-acetaminophen, and Oxycodone  Review  of Systems   Review of Systems  Constitutional: Negative for chills and fever.  Respiratory: Negative for cough and shortness of breath.   Cardiovascular: Positive for chest pain.  Gastrointestinal: Negative for abdominal pain, blood in stool, nausea and vomiting.  Genitourinary: Negative for dysuria.  Neurological: Negative for dizziness, syncope and light-headedness.  All other systems reviewed and are negative.   Physical Exam Updated Vital Signs BP (!) 159/86 (BP Location: Right Arm)   Pulse 96   Temp 99.2 F (37.3 C) (Oral)   Resp 18   Ht 5\' 2"  (1.575 m)   Wt 94.8 kg   LMP  (LMP Unknown)   SpO2 100%   BMI 38.23 kg/m   Physical Exam Vitals and nursing note reviewed.  Constitutional:      General: She is not in acute distress.    Appearance: She is well-developed. She is not toxic-appearing.  HENT:     Head: Normocephalic and atraumatic.     Mouth/Throat:     Pharynx: Oropharynx is clear. Uvula midline. No oropharyngeal exudate or posterior oropharyngeal erythema.     Comments: Posterior oropharynx is symmetric appearing. Patient tolerating own secretions without difficulty. No trismus. No drooling. No hot potato voice. No swelling beneath the tongue, submandibular compartment is soft.  Eyes:     General:        Right eye: No discharge.        Left eye: No discharge.     Conjunctiva/sclera: Conjunctivae normal.  Cardiovascular:     Rate and Rhythm: Normal rate and regular rhythm.     Pulses:          Radial pulses are 2+ on the right side and 2+ on the left side.  Pulmonary:     Effort: Pulmonary effort is normal. No respiratory distress.     Breath sounds: Normal breath sounds. No wheezing, rhonchi or rales.  Chest:     Chest wall: No tenderness.  Abdominal:     General: There is no distension.     Palpations: Abdomen is soft.     Tenderness: There is no abdominal tenderness. There is no guarding or rebound.  Musculoskeletal:     Cervical back: Normal range  of motion and neck supple. No edema, erythema, rigidity or crepitus. No pain with movement. Normal range of motion.     Right lower leg: No tenderness. No edema.     Left lower leg: No tenderness. No edema.  Skin:    General: Skin is warm and dry.     Findings: No rash.  Neurological:     Mental Status: She is alert.     Comments: Clear speech.   Psychiatric:  Behavior: Behavior normal.     ED Results / Procedures / Treatments   Labs (all labs ordered are listed, but only abnormal results are displayed) Labs Reviewed  COMPREHENSIVE METABOLIC PANEL - Abnormal; Notable for the following components:      Result Value   Potassium 3.4 (*)    Glucose, Bld 108 (*)    Calcium 8.5 (*)    Albumin 2.8 (*)    All other components within normal limits  CBC WITH DIFFERENTIAL/PLATELET - Abnormal; Notable for the following components:   Hemoglobin 11.6 (*)    All other components within normal limits  TROPONIN I (HIGH SENSITIVITY)  TROPONIN I (HIGH SENSITIVITY)    EKG EKG Interpretation  Date/Time:  Thursday June 03 2019 07:13:24 EST Ventricular Rate:  100 PR Interval:    QRS Duration: 88 QT Interval:  347 QTC Calculation: 448 R Axis:   -30 Text Interpretation: Sinus tachycardia Probable left atrial enlargement Left axis deviation ST elevation, consider inferior injury Confirmed by Gerlene Fee [phone]) on 06/03/2019 7:36:01 AM   Radiology DG Chest Port 1 View  Result Date: 06/03/2019 CLINICAL DATA:  Chest pain EXAM: PORTABLE CHEST 1 VIEW COMPARISON:  None. FINDINGS: Lungs are clear. Heart size and pulmonary vascularity are normal. No adenopathy. No pneumothorax. No bone lesions. IMPRESSION: No abnormality noted. Electronically Signed   By: Lowella Grip III M.D.   On: 06/03/2019 07:50    Procedures Procedures (including critical care time)  Medications Ordered in ED Medications - No data to display  ED Course  I have reviewed the triage vital signs and the  nursing notes.  Pertinent labs & imaging results that were available during my care of the patient were reviewed by me and considered in my medical decision making (see chart for details).    MDM Rules/Calculators/A&P                      Patient presents to the emergency department with chest pain. Patient nontoxic appearing, in no apparent distress, vitals without significant abnormality, BP Intermittently somewhat elevated. Fairly benign physical exam.   Prior Exercise tolerate test 06/18/2018: Negative Prior heart catheterization reviewed: N/A  DDX: ACS, pulmonary embolism, dissection, pneumothorax, pneumonia, arrhythmia, severe anemia, MSK, GERD, anxiety. Evaluation initiated with labs, EKG, and CXR. Patient on cardiac monitor.   CBC: Anemia similar to prior on chart review.  CMP: Mild hypokalemia/calcemia but no significant electrolyte derangement.  Troponin: No significant elevation.  EKG: no STEMI CXR:  Negative, without infiltrate, effusion, pneumothorax, or fracture/dislocation.   EKG without STEMI, reviewed with Dr. Sedonia Small, delta troponin negative, doubt ACS. Patient is low risk wells, doubt pulmonary embolism. Pain is not a tearing sensation, symmetric pulses, no widening of mediastinum on CXR, doubt dissection. Some improvement s/p pepcid & gi cocktail.. Patient has appeared hemodynamically stable throughout ER visit and appears safe for discharge with close PCP/cardiology follow up. Will start PPI. I discussed results, treatment plan, need for PCP follow-up, and return precautions with the patient. Provided opportunity for questions, patient confirmed understanding and is in agreement with plan.   Findings and plan of care discussed with supervising physician Dr. Sedonia Small who has evaluated patient & is in agreement.   Final Clinical Impression(s) / ED Diagnoses Final diagnoses:  Chest pain, unspecified type    Rx / DC Orders ED Discharge Orders         Ordered    omeprazole  (PRILOSEC) 20 MG capsule  Daily  06/03/19 1106           Henchy Mccauley, [place], PA-C 06/03/19 1109    Maudie Flakes, MD 06/05/19 [id number]

## 2019-06-03 NOTE — Discharge Instructions (Signed)
You were seen in the emergency department today for chest pain. Your work-up in the emergency department has been overall reassuring. Your labs have been fairly normal and or similar to previous blood work you have had done, your potassium and calcium were very mildly low- please see attached diet guidelines. Your hemoglobin/hematocrit showed mild anemia but this is improved from prior labs you have had done. Your EKG and the enzyme we use to check your heart did not show an acute heart attack at this time. Your chest x-ray was normal.   Please follow attached diet guidelines.   We are prescribing you omeprazole to help with possible stomach acidity problem, please take once per day in the morning prior to meals.   We have prescribed you new medication(s) today. Discuss the medications prescribed today with your pharmacist as they can have adverse effects and interactions with your other medicines including over the counter and prescribed medications. Seek medical evaluation if you start to experience new or abnormal symptoms after taking one of these medicines, seek care immediately if you start to experience difficulty breathing, feeling of your throat closing, facial swelling, or rash as these could be indications of a more serious allergic reaction   We would like you to follow up closely with your primary care provider and/or the cardiologist provided in your discharge instructions within 1-3 days. Return to the ER immediately should you experience any new or worsening symptoms including but not limited to return of pain, worsened pain, vomiting, shortness of breath, dizziness, lightheadedness, passing out, or any other concerns that you may have.

## 2019-06-08 ENCOUNTER — Other Ambulatory Visit: Payer: Self-pay

## 2019-06-09 ENCOUNTER — Ambulatory Visit: Payer: BC Managed Care – PPO | Admitting: Family Medicine

## 2019-06-09 ENCOUNTER — Ambulatory Visit (INDEPENDENT_AMBULATORY_CARE_PROVIDER_SITE_OTHER): Payer: BC Managed Care – PPO | Admitting: Family Medicine

## 2019-06-09 ENCOUNTER — Encounter: Payer: Self-pay | Admitting: Family Medicine

## 2019-06-09 VITALS — BP 120/80 | HR 80 | Temp 97.3°F | Wt 205.0 lb

## 2019-06-09 DIAGNOSIS — R079 Chest pain, unspecified: Secondary | ICD-10-CM

## 2019-06-09 DIAGNOSIS — K219 Gastro-esophageal reflux disease without esophagitis: Secondary | ICD-10-CM

## 2019-06-09 NOTE — Progress Notes (Signed)
   Subjective:    Patient ID: Kiara Ramirez, female    DOB: 1950/10/29, 69 y.o.   MRN: [id number]  HPI Here to follow up an ER visit on 06-03-19 when she woke up early in the morning with chest pains. This was a squeezing pain that radiated up to her throat, and she was doing a lot of belching. No SOB or sweats or nausea. Her exam, CXR, cardiac enzymes, and EKG were all unremarkable. This was felt to be due to GERD, and her symptoms resolved promptly with a GI cocktail. She was given some Prilosec to take, but she actually has not taken any of this due to fear of side effects. Instead she has changed her eating habits to eat small portions and to avoid large meals right before she goes to bed. Since that day she has felt fine.    Review of Systems  Constitutional: Negative.   Respiratory: Negative.   Cardiovascular: Positive for chest pain. Negative for palpitations and leg swelling.  Gastrointestinal: Negative.        Objective:   Physical Exam Constitutional:      General: She is not in acute distress.    Appearance: Normal appearance.  Cardiovascular:     Rate and Rhythm: Normal rate and regular rhythm.     Pulses: Normal pulses.     Heart sounds: Normal heart sounds.  Pulmonary:     Effort: Pulmonary effort is normal.     Breath sounds: Normal breath sounds.  Abdominal:     General: Abdomen is flat. Bowel sounds are normal. There is no distension.     Palpations: Abdomen is soft. There is no mass.     Tenderness: There is no abdominal tenderness. There is no guarding or rebound.     Hernia: No hernia is present.  Neurological:     Mental Status: She is alert.           Assessment & Plan:  Her chest pain was likely due to esophageal spasm in response to GERD. She will continue to monitor her eating habits and she will return if this happens again. Alysia Penna, MD

## 2019-08-30 ENCOUNTER — Encounter: Payer: Self-pay | Admitting: Family Medicine

## 2019-08-30 ENCOUNTER — Ambulatory Visit (INDEPENDENT_AMBULATORY_CARE_PROVIDER_SITE_OTHER): Payer: BC Managed Care – PPO | Admitting: Family Medicine

## 2019-08-30 ENCOUNTER — Other Ambulatory Visit: Payer: Self-pay

## 2019-08-30 VITALS — BP 120/60 | HR 87 | Temp 97.6°F | Wt 205.0 lb

## 2019-08-30 DIAGNOSIS — I10 Essential (primary) hypertension: Secondary | ICD-10-CM

## 2019-08-30 DIAGNOSIS — M79604 Pain in right leg: Secondary | ICD-10-CM | POA: Diagnosis not present

## 2019-08-30 DIAGNOSIS — M79605 Pain in left leg: Secondary | ICD-10-CM

## 2019-08-30 NOTE — Progress Notes (Signed)
   Subjective:    Patient ID: Kiara Ramirez, female    DOB: March 03, 1951, 69 y.o.   MRN: [id number]  HPI Here for 2 weeks of intermittent mild pains in both calves. No swelling in the legs or feet. No SOB. She is supposed to be taking 40 mEq of potassium a day, but she has actually been taking only 20 mEq. Her potassium in February was 3.4.     Review of Systems  Constitutional: Negative.   Respiratory: Negative.   Cardiovascular: Negative.   Musculoskeletal: Positive for myalgias.       Objective:   Physical Exam Constitutional:      Appearance: Normal appearance.  Cardiovascular:     Rate and Rhythm: Normal rate and regular rhythm.     Pulses: Normal pulses.     Heart sounds: Normal heart sounds.  Pulmonary:     Effort: Pulmonary effort is normal.     Breath sounds: Normal breath sounds.  Musculoskeletal:     Right lower leg: No edema.     Left lower leg: No edema.     Comments: She is slightly tender in both calves. No cords are felt   Neurological:     Mental Status: She is alert.           Assessment & Plan:  Leg pain, possibly due to hypokalemia. I urged her to take the full 40 mEq daily of potassium and she agreed. Get dopplers of the legs to rule out DVT.  Alysia Penna, MD

## 2019-09-02 ENCOUNTER — Other Ambulatory Visit: Payer: Self-pay

## 2019-09-02 ENCOUNTER — Ambulatory Visit (HOSPITAL_COMMUNITY)
Admission: RE | Admit: 2019-09-02 | Discharge: 2019-09-02 | Disposition: A | Discharge status: Home / Self Care | Payer: BC Managed Care – PPO | Source: Ambulatory Visit | Attending: Cardiology | Admitting: Cardiology

## 2019-09-02 DIAGNOSIS — M79605 Pain in left leg: Secondary | ICD-10-CM | POA: Insufficient documentation

## 2019-09-02 DIAGNOSIS — M79604 Pain in right leg: Secondary | ICD-10-CM | POA: Insufficient documentation

## 2019-11-17 ENCOUNTER — Other Ambulatory Visit: Payer: Self-pay | Admitting: Family Medicine

## 2019-12-14 ENCOUNTER — Telehealth (INDEPENDENT_AMBULATORY_CARE_PROVIDER_SITE_OTHER): Payer: BC Managed Care – PPO | Admitting: Family Medicine

## 2019-12-14 ENCOUNTER — Other Ambulatory Visit: Payer: Self-pay

## 2019-12-14 ENCOUNTER — Encounter: Payer: Self-pay | Admitting: Family Medicine

## 2019-12-14 DIAGNOSIS — M109 Gout, unspecified: Secondary | ICD-10-CM

## 2019-12-14 MED ORDER — METHYLPREDNISOLONE 4 MG PO TBPK: ORAL_TABLET | ORAL | 0 refills | Status: DC

## 2019-12-14 NOTE — Progress Notes (Signed)
Subjective:    Patient ID: Kiara Ramirez, female    DOB: October 31, 1950, 69 y.o.   MRN: [id number]  HPI Virtual Visit via Video Note  I connected with the patient on 12/14/19 at  4:00 PM EDT by a video enabled telemedicine application and verified that I am speaking with the correct person using two identifiers.  Location patient: home Location provider:work or home office Persons participating in the virtual visit: patient, provider  I discussed the limitations of evaluation and management by telemedicine and the availability of in person appointments. The patient expressed understanding and agreed to proceed.   HPI: Here for a gout flare in the right foot. She has swelling and warmth and pain at the base of the right great toe. No recent trauma.    ROS: See pertinent positives and negatives per HPI.  Past Medical History:  Diagnosis Date  . Allergy   . Anemia   . Anxiety   . 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 meds  . B12 deficiency   . GERD (gastroesophageal reflux disease)   . Gout   . Hypertension     Past Surgical History:  Procedure Laterality Date  . COLONOSCOPY  12/16/2018   per Dr. Fuller Plan, incomplete exam    . CYSTOSCOPY  04/30/2018   Procedure: CYSTOSCOPY;  Surgeon: Everett Graff, MD;  Location: [facility];  Service: Gynecology;;  . Darletta Moll BARIUM ENEMA ([facility])  01/18/2019   per Dr. Fuller Plan, incomplete exam, get CT colonography in 5 years   . HYSTERECTOMY ABDOMINAL WITH SALPINGO-OOPHORECTOMY Bilateral 04/30/2018   Procedure: TOTAL ABDOMINAL HYSTERECTOMY WITH BILATERAL SALPINGO-OOPHORECTOMY;  Surgeon: Everett Graff, MD;  Location: [facility];  Service: Gynecology;  Laterality: Bilateral;  . LAPAROSCOPY  04/30/2018   Procedure: LAPAROSCOPY DIAGNOSTIC;  Surgeon: Everett Graff, MD;  Location: [facility];  Service: Gynecology;;  . TUBAL LIGATION      Family History  Problem Relation Age of Onset  . Heart failure Mother        No details  . Anemia Father   . Breast cancer Sister   .  Cancer Other        colon/endometrial  . Hypertension Other        family hx  . Arthritis Other        family hx  . Diabetes Other        family hx  . Colon cancer Neg Hx   . Esophageal cancer Neg Hx   . Stomach cancer Neg Hx   . Rectal cancer Neg Hx      Current Outpatient Medications:  .  aspirin 81 MG tablet, Take 81 mg by mouth daily.  , Disp: , Rfl:  .  cyanocobalamin (,VITAMIN B-12,) 1000 MCG/ML injection, Inject 1 mL (1,000 mcg total) into the muscle every 30 (thirty) days. Reported on 06/28/2015, Disp: 10 mL, Rfl: 5 .  cyclobenzaprine (FLEXERIL) 10 MG tablet, Take 1 tablet (10 mg total) by mouth 3 (three) times daily as needed for muscle spasms., Disp: 60 tablet, Rfl: 2 .  hydrochlorothiazide (HYDRODIURIL) 25 MG tablet, Take 1 tablet (25 mg total) by mouth daily., Disp: 90 tablet, Rfl: 3 .  methylPREDNISolone (MEDROL DOSEPAK) 4 MG TBPK tablet, As directed, Disp: 21 tablet, Rfl: 0 .  omeprazole (PRILOSEC) 20 MG capsule, Take 1 capsule (20 mg total) by mouth daily., Disp: 30 capsule, Rfl: 0 .  potassium chloride (KLOR-CON) 10 MEQ tablet, Take 2 tablets by mouth twice daily, Disp: 180 tablet, Rfl: 0  EXAM:  VITALS per patient if applicable:  GENERAL: alert, oriented, appears well and in no acute distress  HEENT: atraumatic, conjunttiva clear, no obvious abnormalities on inspection of external nose and ears  NECK: normal movements of the head and neck  LUNGS: on inspection no signs of respiratory distress, breathing rate appears normal, no obvious gross SOB, gasping or wheezing  CV: no obvious cyanosis  MS: moves all visible extremities without noticeable abnormality  PSYCH/NEURO: pleasant and cooperative, no obvious depression or anxiety, speech and thought processing grossly intact  ASSESSMENT AND PLAN: Gout, treat with a Medrol dose pack.  Alysia Penna, MD  Discussed the following assessment and plan:  No diagnosis found.     I discussed the assessment and  treatment plan with the patient. The patient was provided an opportunity to ask questions and all were answered. The patient agreed with the plan and demonstrated an understanding of the instructions.   The patient was advised to call back or seek an in-person evaluation if the symptoms worsen or if the condition fails to improve as anticipated.     Review of Systems     Objective:   Physical Exam        Assessment & Plan:

## 2020-02-04 ENCOUNTER — Ambulatory Visit: Payer: BC Managed Care – PPO | Attending: Internal Medicine

## 2020-02-04 DIAGNOSIS — Z23 Encounter for immunization: Secondary | ICD-10-CM

## 2020-02-04 NOTE — Progress Notes (Signed)
° °  Covid-19 Vaccination Clinic  Name:  Kiara Ramirez    MRN: [id number] DOB: [birth year]  02/04/2020  [name] was observed post Covid-19 immunization for 15 minutes without incident. She was provided with Vaccine Information Sheet and instruction to access the V-Safe system.   [name] was instructed to call 911 with any severe reactions post vaccine:  Difficulty breathing   Swelling of face and throat   A fast heartbeat   A bad rash all over body   Dizziness and weakness

## 2020-03-15 ENCOUNTER — Other Ambulatory Visit: Payer: Self-pay | Admitting: Family Medicine

## 2020-05-15 ENCOUNTER — Other Ambulatory Visit: Payer: Self-pay

## 2020-05-15 ENCOUNTER — Ambulatory Visit (INDEPENDENT_AMBULATORY_CARE_PROVIDER_SITE_OTHER): Payer: BC Managed Care – PPO

## 2020-05-15 ENCOUNTER — Ambulatory Visit (HOSPITAL_COMMUNITY)
Admission: EM | Admit: 2020-05-15 | Discharge: 2020-05-15 | Disposition: A | Discharge status: Home / Self Care | Payer: BC Managed Care – PPO | Attending: Urgent Care | Admitting: Urgent Care

## 2020-05-15 ENCOUNTER — Encounter (HOSPITAL_COMMUNITY): Payer: Self-pay

## 2020-05-15 DIAGNOSIS — M5030 Other cervical disc degeneration, unspecified cervical region: Secondary | ICD-10-CM

## 2020-05-15 DIAGNOSIS — R9431 Abnormal electrocardiogram [ECG] [EKG]: Secondary | ICD-10-CM

## 2020-05-15 DIAGNOSIS — R200 Anesthesia of skin: Secondary | ICD-10-CM | POA: Diagnosis not present

## 2020-05-15 DIAGNOSIS — M542 Cervicalgia: Secondary | ICD-10-CM | POA: Diagnosis not present

## 2020-05-15 DIAGNOSIS — R030 Elevated blood-pressure reading, without diagnosis of hypertension: Secondary | ICD-10-CM

## 2020-05-15 DIAGNOSIS — M79602 Pain in left arm: Secondary | ICD-10-CM

## 2020-05-15 DIAGNOSIS — I10 Essential (primary) hypertension: Secondary | ICD-10-CM

## 2020-05-15 DIAGNOSIS — R202 Paresthesia of skin: Secondary | ICD-10-CM

## 2020-05-15 MED ORDER — PREDNISONE 50 MG PO TABS: 50.0000 mg | ORAL_TABLET | Freq: Every day | ORAL | 0 refills | Status: DC

## 2020-05-15 NOTE — ED Triage Notes (Signed)
Pt in with c/o left arm/hand numbness and soreness that began yesterday. States that it started with tingling in both hands but the right hand resolved  Denies any vision changes, slurred speech or weakness  pt did not take medicine for sxs

## 2020-05-15 NOTE — ED Provider Notes (Signed)
[place]   MRN: [id number] DOB: Jan 27, 1951  Subjective:   Kiara Ramirez is a 70 y.o. female presenting for 1 day history of left arm soreness, numbness and tingling of her fingertips bilaterally but worse in the left hand.  Patient is also having pain along the left side of her neck that radiates upwards to the back of her head and also down her arm.  Has a history of high blood pressure, takes medication for this.  Denies headache, confusion, chest pain, diaphoresis, nausea, vomiting, belly pain.  Patient is not a smoker.  No current facility-administered medications for this encounter.  Current Outpatient Medications:  .  aspirin 81 MG tablet, Take 81 mg by mouth daily.  , Disp: , Rfl:  .  cyanocobalamin (,VITAMIN B-12,) 1000 MCG/ML injection, Inject 1 mL (1,000 mcg total) into the muscle every 30 (thirty) days. Reported on 06/28/2015, Disp: 10 mL, Rfl: 5 .  cyclobenzaprine (FLEXERIL) 10 MG tablet, Take 1 tablet (10 mg total) by mouth 3 (three) times daily as needed for muscle spasms., Disp: 60 tablet, Rfl: 2 .  hydrochlorothiazide (HYDRODIURIL) 25 MG tablet, Take 1 tablet (25 mg total) by mouth daily., Disp: 90 tablet, Rfl: 3 .  methylPREDNISolone (MEDROL DOSEPAK) 4 MG TBPK tablet, As directed, Disp: 21 tablet, Rfl: 0 .  omeprazole (PRILOSEC) 20 MG capsule, Take 1 capsule (20 mg total) by mouth daily., Disp: 30 capsule, Rfl: 0 .  potassium chloride (KLOR-CON) 10 MEQ tablet, Take 2 tablets by mouth twice daily, Disp: 180 tablet, Rfl: 0   Allergies  Allergen Reactions  . Ace Inhibitors Swelling  . Hydrocodone-Acetaminophen Nausea Only  . Oxycodone Nausea And Vomiting    Headache    Past Medical History:  Diagnosis Date  . Allergy   . Anemia   . Anxiety   . 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 meds  . B12 deficiency   . GERD (gastroesophageal reflux disease)   . Gout   . Hypertension      Past Surgical History:  Procedure Laterality Date  . COLONOSCOPY  12/16/2018    per Dr. Fuller Plan, incomplete exam    . CYSTOSCOPY  04/30/2018   Procedure: CYSTOSCOPY;  Surgeon: Everett Graff, MD;  Location: [facility];  Service: Gynecology;;  . Darletta Moll BARIUM ENEMA ([facility])  01/18/2019   per Dr. Fuller Plan, incomplete exam, get CT colonography in 5 years   . HYSTERECTOMY ABDOMINAL WITH SALPINGO-OOPHORECTOMY Bilateral 04/30/2018   Procedure: TOTAL ABDOMINAL HYSTERECTOMY WITH BILATERAL SALPINGO-OOPHORECTOMY;  Surgeon: Everett Graff, MD;  Location: [facility];  Service: Gynecology;  Laterality: Bilateral;  . LAPAROSCOPY  04/30/2018   Procedure: LAPAROSCOPY DIAGNOSTIC;  Surgeon: Everett Graff, MD;  Location: [facility];  Service: Gynecology;;  . TUBAL LIGATION      Family History  Problem Relation Age of Onset  . Heart failure Mother        No details  . Anemia Father   . Breast cancer Sister   . Cancer Other        colon/endometrial  . Hypertension Other        family hx  . Arthritis Other        family hx  . Diabetes Other        family hx  . Colon cancer Neg Hx   . Esophageal cancer Neg Hx   . Stomach cancer Neg Hx   . Rectal cancer Neg Hx     Social History   Tobacco Use  .  Smoking status: Never Smoker  . Smokeless tobacco: Never Used  Vaping Use  . Vaping Use: Never used  Substance Use Topics  . Alcohol use: No    Alcohol/week: 0.0 standard drinks  . Drug use: No    ROS   Objective:   Vitals: BP (!) 160/86   Pulse 84   Temp 98.2 F (36.8 C)   Resp 19   LMP  (LMP Unknown)   SpO2 98%   Physical Exam Constitutional:      General: She is not in acute distress.    Appearance: Normal appearance. She is well-developed. She is not ill-appearing, toxic-appearing or diaphoretic.  HENT:     Head: Normocephalic and atraumatic.     Nose: Nose normal.     Mouth/Throat:     Mouth: Mucous membranes are moist.  Eyes:     Extraocular Movements: Extraocular movements intact.     Pupils: Pupils are equal, round, and reactive to light.  Cardiovascular:     Rate  and Rhythm: Normal rate and regular rhythm.     Pulses: Normal pulses.     Heart sounds: Normal heart sounds. No murmur heard. No friction rub. No gallop.   Pulmonary:     Effort: Pulmonary effort is normal. No respiratory distress.     Breath sounds: Normal breath sounds. No stridor. No wheezing, rhonchi or rales.  Musculoskeletal:     Comments: Full range of motion at the cervical region.  Negative Spurling and Lhermitte sign.  Strength 5/5 for upper and lower extremities.  Patient does have tenderness along the left trapezius and left upper extremity along her lateral bicep, tricep.  Skin:    General: Skin is warm and dry.     Findings: No rash.  Neurological:     Mental Status: She is alert and oriented to person, place, and time.     Cranial Nerves: No cranial nerve deficit.     Motor: No weakness.     Coordination: Coordination normal.     Gait: Gait normal.     Deep Tendon Reflexes: Reflexes normal.     Comments: Negative Romberg and pronator drift.  Psychiatric:        Mood and Affect: Mood normal.        Behavior: Behavior normal.        Thought Content: Thought content normal.        Judgment: Judgment normal.    ED ECG REPORT   Date: 05/15/2020  Rate: 67bpm  Rhythm: normal sinus rhythm  QRS Axis: left  Intervals: normal  ST/T Wave abnormalities: normal  Conduction Disutrbances:first-degree A-V block   Narrative Interpretation: Sinus rhythm at 67bpm, first degree AV block (different from previous ecg). No acute findings.   Old EKG Reviewed: changes noted  I have personally reviewed the EKG tracing and agree with the computerized printout as noted.  DG Cervical Spine Complete  Result Date: 05/15/2020 CLINICAL DATA:  Left arm and hand numbness and soreness beginning yesterday. EXAM: CERVICAL SPINE - COMPLETE 4+ VIEW COMPARISON:  None. FINDINGS: Straightening of the normal cervical lordosis. Degenerative spondylosis with disc space narrowing at C5-6 and C6-7. 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 Mild foraminal narrowing on the left at C3-4, C4-5 and C5-6. Mild bony foraminal narrowing on the right at C5-6 due to uncovertebral osteophytes. IMPRESSION: No acute radiographic finding. Degenerative disc disease and degenerative facet disease as above. Mild foraminal narrowing left more than right. Electronically Signed  By: Nelson Chimes M.D.   On: 05/15/2020 10:20   Assessment and Plan :   PDMP not reviewed this encounter.  1. Degenerative disc disease, cervical   2. Left arm pain   3. Numbness and tingling in both hands   4. Essential hypertension   5. Elevated blood pressure reading   6. Nonspecific abnormal electrocardiogram (ECG) (EKG)     We will manage her symptoms for cervical radiculopathy given x-ray findings of degenerative disc disease, foraminal narrowing.  Recommended oral prednisone course, follow-up with neurosurgery.  EKG is abnormal but suspect that the left arm pain, neck pain is not related to a cardiac source.  There are no acute findings but patient does need to follow-up with her cardiologist.  No signs of stroke, intracranial process.  Counseled patient on potential for adverse effects with medications prescribed/recommended today, ER and return-to-clinic precautions discussed, patient verbalized understanding.    Jaynee Eagles, PA-C 05/15/20 [id number]

## 2020-06-05 ENCOUNTER — Other Ambulatory Visit: Payer: Self-pay

## 2020-06-06 ENCOUNTER — Ambulatory Visit (INDEPENDENT_AMBULATORY_CARE_PROVIDER_SITE_OTHER): Payer: BC Managed Care – PPO | Admitting: Family Medicine

## 2020-06-06 ENCOUNTER — Encounter: Payer: Self-pay | Admitting: Family Medicine

## 2020-06-06 VITALS — BP 122/80 | HR 75 | Temp 98.2°F | Ht 62.5 in | Wt 212.6 lb

## 2020-06-06 DIAGNOSIS — M109 Gout, unspecified: Secondary | ICD-10-CM | POA: Diagnosis not present

## 2020-06-06 DIAGNOSIS — Z0000 Encounter for general adult medical examination without abnormal findings: Secondary | ICD-10-CM | POA: Diagnosis not present

## 2020-06-06 DIAGNOSIS — E538 Deficiency of other specified B group vitamins: Secondary | ICD-10-CM | POA: Diagnosis not present

## 2020-06-06 DIAGNOSIS — R739 Hyperglycemia, unspecified: Secondary | ICD-10-CM | POA: Diagnosis not present

## 2020-06-06 LAB — HEPATIC FUNCTION PANEL
ALT: 6 U/L (ref 0–35)
AST: 9 U/L (ref 0–37)
Albumin: 3.4 g/dL — ABNORMAL LOW (ref 3.5–5.2)
Alkaline Phosphatase: 100 U/L (ref 39–117)
Bilirubin, Direct: 0.1 mg/dL (ref 0.0–0.3)
Total Bilirubin: 0.3 mg/dL (ref 0.2–1.2)
Total Protein: 7.4 g/dL (ref 6.0–8.3)

## 2020-06-06 LAB — BASIC METABOLIC PANEL
BUN: 14 mg/dL (ref 6–23)
CO2: 34 mEq/L — ABNORMAL HIGH (ref 19–32)
Calcium: 8.8 mg/dL (ref 8.4–10.5)
Chloride: 98 mEq/L (ref 96–112)
Creatinine, Ser: 0.84 mg/dL (ref 0.40–1.20)
GFR: 71.33 mL/min (ref >60.00)
Glucose, Bld: 85 mg/dL (ref 70–99)
Potassium: 3.6 mEq/L (ref 3.5–5.1)
Sodium: 139 mEq/L (ref 135–145)

## 2020-06-06 LAB — LIPID PANEL
Cholesterol: 117 mg/dL (ref 0–200)
HDL: 39.2 mg/dL (ref >39.00)
LDL Cholesterol: 64 mg/dL (ref 0–99)
NonHDL: 77.83
Total CHOL/HDL Ratio: 3
Triglycerides: 67 mg/dL (ref 0.0–149.0)
VLDL: 13.4 mg/dL (ref 0.0–40.0)

## 2020-06-06 LAB — CBC WITH DIFFERENTIAL/PLATELET
Basophils Absolute: 0 10*3/uL (ref 0.0–0.1)
Basophils Relative: 0.4 % (ref 0.0–3.0)
Eosinophils Absolute: 0 10*3/uL (ref 0.0–0.7)
Eosinophils Relative: 0.6 % (ref 0.0–5.0)
HCT: 35.8 % — ABNORMAL LOW (ref 36.0–46.0)
Hemoglobin: 12 g/dL (ref 12.0–15.0)
Lymphocytes Relative: 23.8 % (ref 12.0–46.0)
Lymphs Abs: 1.7 10*3/uL (ref 0.7–4.0)
MCHC: 33.4 g/dL (ref 30.0–36.0)
MCV: 87.3 fl (ref 78.0–100.0)
Monocytes Absolute: 0.3 10*3/uL (ref 0.1–1.0)
Monocytes Relative: 4.7 % (ref 3.0–12.0)
Neutro Abs: 5 10*3/uL (ref 1.4–7.7)
Neutrophils Relative %: 70.5 % (ref 43.0–77.0)
Platelets: 371 10*3/uL (ref 150.0–400.0)
RBC: 4.1 Mil/uL (ref 3.87–5.11)
RDW: 14.4 % (ref 11.5–15.5)
WBC: 7.1 10*3/uL (ref 4.0–10.5)

## 2020-06-06 LAB — VITAMIN B12: Vitamin B-12: 231 pg/mL (ref 211–911)

## 2020-06-06 LAB — TSH: TSH: 1.15 u[IU]/mL (ref 0.35–4.50)

## 2020-06-06 LAB — URIC ACID: Uric Acid, Serum: 7.2 mg/dL — ABNORMAL HIGH (ref 2.4–7.0)

## 2020-06-06 LAB — HEMOGLOBIN A1C: Hgb A1c MFr Bld: 5.9 % (ref 4.6–6.5)

## 2020-06-06 MED ORDER — HYDROCHLOROTHIAZIDE 25 MG PO TABS: 25.0000 mg | ORAL_TABLET | Freq: Every day | ORAL | 3 refills | Status: DC

## 2020-06-06 MED ORDER — POTASSIUM CHLORIDE ER 10 MEQ PO TBCR: 20.0000 meq | EXTENDED_RELEASE_TABLET | Freq: Two times a day (BID) | ORAL | 3 refills | Status: DC

## 2020-06-06 NOTE — Progress Notes (Signed)
Subjective:    Patient ID: Kiara Ramirez, female    DOB: 1951-01-03, 70 y.o.   MRN: [id number]  HPI Here for a well exam. She feels well except for some pain and tingling and numbness going down the left arm. This started a few months ago. No recent trauma. She was seen in urgent care on 05-15-20, and they suspected a pinched nerve in her neck. They referred her to see someone at [facility] for this. She saw her GYN last week for an exam and mammogram. Her gout has been fairly quiet for several months.    Review of Systems  Constitutional: Negative.   HENT: Negative.   Eyes: Negative.   Respiratory: Negative.   Cardiovascular: Negative.   Gastrointestinal: Negative.   Genitourinary: Negative for decreased urine volume, difficulty urinating, dyspareunia, dysuria, enuresis, flank pain, frequency, hematuria, pelvic pain and urgency.  Musculoskeletal: Negative.   Skin: Negative.   Neurological: Positive for numbness.  Psychiatric/Behavioral: Negative.        Objective:   Physical Exam Constitutional:      General: She is not in acute distress.    Appearance: She is well-developed and well-nourished. She is obese.  HENT:     Head: Normocephalic and atraumatic.     Right Ear: External ear normal.     Left Ear: External ear normal.     Nose: Nose normal.     Mouth/Throat:     Mouth: Oropharynx is clear and moist.     Pharynx: No oropharyngeal exudate.  Eyes:     General: No scleral icterus.    Extraocular Movements: EOM normal.     Conjunctiva/sclera: Conjunctivae normal.     Pupils: Pupils are equal, round, and reactive to light.  Neck:     Thyroid: No thyromegaly.     Vascular: No JVD.  Cardiovascular:     Rate and Rhythm: Normal rate and regular rhythm.     Pulses: Intact distal pulses.     Heart sounds: Normal heart sounds. No murmur heard. No friction rub. No gallop.   Pulmonary:     Effort: Pulmonary effort is normal. No respiratory distress.     Breath  sounds: Normal breath sounds. No wheezing or rales.  Chest:     Chest wall: No tenderness.  Abdominal:     General: Bowel sounds are normal. There is no distension.     Palpations: Abdomen is soft. There is no mass.     Tenderness: There is no abdominal tenderness. There is no guarding or rebound.  Musculoskeletal:        General: No tenderness or edema. Normal range of motion.     Cervical back: Normal range of motion and neck supple.  Lymphadenopathy:     Cervical: No cervical adenopathy.  Skin:    General: Skin is warm and dry.     Findings: No erythema or rash.  Neurological:     Mental Status: She is alert and oriented to person, place, and time.     Cranial Nerves: No cranial nerve deficit.     Motor: No abnormal muscle tone.     Coordination: Coordination normal.     Deep Tendon Reflexes: Reflexes are normal and symmetric. Reflexes normal.  Psychiatric:        Mood and Affect: Mood and affect normal.        Behavior: Behavior normal.        Thought Content: Thought content normal.  Judgment: Judgment normal.           Assessment & Plan:  Well exam. We discussed diet and exercise. Get fasting labs to include lipids, an A1c, and a uric acid level.  Alysia Penna, MD

## 2020-06-07 ENCOUNTER — Other Ambulatory Visit: Payer: Self-pay

## 2020-06-07 MED ORDER — ALLOPURINOL 100 MG PO TABS: 100.0000 mg | ORAL_TABLET | Freq: Every day | ORAL | 3 refills | Status: DC

## 2020-07-06 ENCOUNTER — Other Ambulatory Visit: Payer: Self-pay | Admitting: Obstetrics and Gynecology

## 2020-09-04 ENCOUNTER — Ambulatory Visit: Payer: BC Managed Care – PPO | Attending: Family

## 2020-09-04 DIAGNOSIS — Z23 Encounter for immunization: Secondary | ICD-10-CM

## 2020-09-04 NOTE — Progress Notes (Signed)
   Covid-19 Vaccination Clinic  Name:  Kiara Ramirez    MRN: [id number] DOB: Nov 01, 1950  09/04/2020  [name] was observed post Covid-19 immunization for 15 minutes without incident. She was provided with Vaccine Information Sheet and instruction to access the V-Safe system.   Ms. Cue was instructed to call 911 with any severe reactions post vaccine: Marland Kitchen Difficulty breathing  . Swelling of face and throat  . A fast heartbeat  . A bad rash all over body  . Dizziness and weakness   Immunizations Administered    Name Date Dose VIS Date Route   Pfizer COVID-19 Vaccine 09/04/2020  9:30 AM 0.3 mL 02/02/2020 Intramuscular   Manufacturer: [employer]   Lot: [id number]   NDC: [address]-2

## 2020-10-12 ENCOUNTER — Ambulatory Visit (HOSPITAL_COMMUNITY)
Admission: EM | Admit: 2020-10-12 | Discharge: 2020-10-12 | Disposition: A | Discharge status: Home / Self Care | Payer: BC Managed Care – PPO | Attending: Emergency Medicine | Admitting: Emergency Medicine

## 2020-10-12 ENCOUNTER — Other Ambulatory Visit: Payer: Self-pay

## 2020-10-12 ENCOUNTER — Encounter (HOSPITAL_COMMUNITY): Payer: Self-pay

## 2020-10-12 DIAGNOSIS — R5383 Other fatigue: Secondary | ICD-10-CM | POA: Diagnosis not present

## 2020-10-12 DIAGNOSIS — M5413 Radiculopathy, cervicothoracic region: Secondary | ICD-10-CM

## 2020-10-12 MED ORDER — NAPROXEN 500 MG PO TABS: 500.0000 mg | ORAL_TABLET | Freq: Two times a day (BID) | ORAL | 0 refills | Status: DC

## 2020-10-12 NOTE — ED Provider Notes (Signed)
[place]    CSN: [id number] Arrival date & time: 10/12/20  1046      History   Chief Complaint Chief Complaint  Patient presents with   Fatigue   Numbness & Tingling in Fingers and Toes    HPI Kiara Ramirez is a 70 y.o. female.   Patient here for bilateral hand numbness and tingling as well as some left toe numbness and tingling that has been worsening for the past week.  Also reports increased fatigue.  Reports having some numbness and tingling in the past and was told it was a possible pinched nerve but symptoms have been more noticeable this past week.  Denies any decreased range of motion.  Denies any trauma, injury, or other precipitating event.  Denies any specific alleviating or aggravating factors.  Denies any fevers, chest pain, shortness of breath, N/V/D, numbness, tingling, weakness, abdominal pain, or headaches.     The history is provided by the patient.   Past Medical History:  Diagnosis Date   Allergy    Anemia    Anxiety    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 meds   B12 deficiency    GERD (gastroesophageal reflux disease)    Gout    Hypertension     Patient Active Problem List   Diagnosis Date Noted   GERD (gastroesophageal reflux disease) 06/09/2019   Gout 08/13/2018   Postmenopausal bleeding 04/30/2018   Post-menopausal bleeding 04/30/2018   Chronic pain of left knee 10/06/2017   HYPERTROPHY OF TONSILS ALONE 03/07/2008   ANXIETY 04/20/2007   Weakness generalized 04/01/2007   B12 deficiency 02/25/2007   Essential hypertension 01/23/2007   HEADACHE 01/23/2007    Past Surgical History:  Procedure Laterality Date   COLONOSCOPY  12/16/2018   per Dr. Fuller Plan, incomplete exam     CYSTOSCOPY  04/30/2018   Procedure: CYSTOSCOPY;  Surgeon: Everett Graff, MD;  Location: [facility];  Service: Gynecology;;   DG BARIUM ENEMA ([place] HX)  01/18/2019   per Dr. Fuller Plan, incomplete exam, get CT colonography in 5 years    HYSTERECTOMY ABDOMINAL WITH  SALPINGO-OOPHORECTOMY Bilateral 04/30/2018   Procedure: TOTAL ABDOMINAL HYSTERECTOMY WITH BILATERAL SALPINGO-OOPHORECTOMY;  Surgeon: Everett Graff, MD;  Location: [facility];  Service: Gynecology;  Laterality: Bilateral;   LAPAROSCOPY  04/30/2018   Procedure: LAPAROSCOPY DIAGNOSTIC;  Surgeon: Everett Graff, MD;  Location: [facility];  Service: Gynecology;;   TUBAL LIGATION      OB History   No obstetric history on file.      Home Medications    Prior to Admission medications   Medication Sig Start Date End Date Taking? Authorizing Provider  naproxen (NAPROSYN) 500 MG tablet Take 1 tablet (500 mg total) by mouth 2 (two) times daily. 10/12/20  Yes Pearson Forster, NP  allopurinol (ZYLOPRIM) 100 MG tablet Take 1 tablet (100 mg total) by mouth daily. 06/07/20   Laurey Morale, MD  aspirin 81 MG tablet Take 81 mg by mouth daily.    [provider]  clobetasol ointment (TEMOVATE) 0.05 % clobetasol 0.05 % topical ointment  APPLY A THIN LAYER TO THE AFFECTED AREA TWICE DAILY FOR 2 WEEKS    [provider]  hydrochlorothiazide (HYDRODIURIL) 25 MG tablet Take 1 tablet (25 mg total) by mouth daily. 06/06/20   Laurey Morale, MD  potassium chloride (KLOR-CON) 10 MEQ tablet Take 2 tablets (20 mEq total) by mouth 2 (two) times daily. 06/06/20   Laurey Morale, MD    Family  History Family History  Problem Relation Age of Onset   Heart failure Mother        No details   Anemia Father    Breast cancer Sister    Cancer Other        colon/endometrial   Hypertension Other        family hx   Arthritis Other        family hx   Diabetes Other        family hx   Colon cancer Neg Hx    Esophageal cancer Neg Hx    Stomach cancer Neg Hx    Rectal cancer Neg Hx     Social History Social History   Tobacco Use   Smoking status: Never   Smokeless tobacco: Never  Vaping Use   Vaping Use: Never used  Substance Use Topics   Alcohol use: No    Alcohol/week: 0.0 standard drinks   Drug  use: No     Allergies   Ace inhibitors, Hydrocodone-acetaminophen, and Oxycodone   Review of Systems Review of Systems  Constitutional:  Positive for fatigue.  Cardiovascular:  Negative for chest pain.  Musculoskeletal:  Negative for arthralgias, myalgias and neck pain.  Neurological:  Positive for numbness. Negative for dizziness, facial asymmetry, weakness, light-headedness and headaches.  All other systems reviewed and are negative.   Physical Exam Triage Vital Signs ED Triage Vitals  Enc Vitals Group     BP 10/12/20 1102 (!) 152/77     Pulse Rate 10/12/20 1102 76     Resp 10/12/20 1102 17     Temp 10/12/20 1102 98.4 F (36.9 C)     Temp Source 10/12/20 1102 Oral     SpO2 10/12/20 1102 96 %     Weight --      Height --      Head Circumference --      Peak Flow --      Pain Score 10/12/20 1104 0     Pain Loc --      Pain Edu? --      Excl. in [place]? --    No data found.  Updated Vital Signs BP (!) 152/77 (BP Location: Right Arm)   Pulse 76   Temp 98.4 F (36.9 C) (Oral)   Resp 17   LMP  (LMP Unknown)   SpO2 96%   Visual Acuity Right Eye Distance:   Left Eye Distance:   Bilateral Distance:    Right Eye Near:   Left Eye Near:    Bilateral Near:     Physical Exam Vitals and nursing note reviewed.  Constitutional:      General: She is not in acute distress.    Appearance: Normal appearance. She is not ill-appearing, toxic-appearing or diaphoretic.  HENT:     Head: Normocephalic and atraumatic.  Eyes:     Conjunctiva/sclera: Conjunctivae normal.  Cardiovascular:     Rate and Rhythm: Normal rate and regular rhythm.     Pulses: Normal pulses.     Heart sounds: Normal heart sounds.  Pulmonary:     Effort: Pulmonary effort is normal.     Breath sounds: Normal breath sounds.  Abdominal:     General: Abdomen is flat.  Musculoskeletal:        General: Normal range of motion.     Right hand: No swelling, deformity, lacerations, tenderness or bony  tenderness. Normal range of motion. Normal strength. Normal sensation. There is no disruption of two-point discrimination. Normal  capillary refill. Normal pulse.     Left hand: No swelling, deformity, tenderness or bony tenderness. Normal range of motion. Normal strength. Normal sensation. There is no disruption of two-point discrimination. Normal capillary refill. Normal pulse.     Cervical back: Normal and normal range of motion.     Thoracic back: Normal.     Lumbar back: Normal.  Skin:    General: Skin is warm and dry.  Neurological:     General: No focal deficit present.     Mental Status: She is alert and oriented to person, place, and time.     GCS: GCS eye subscore is 4. GCS verbal subscore is 5. GCS motor subscore is 6.     Cranial Nerves: Cranial nerves are intact.     Sensory: Sensation is intact.     Motor: Motor function is intact.  Psychiatric:        Mood and Affect: Mood normal.     UC Treatments / Results  Labs (all labs ordered are listed, but only abnormal results are displayed) Labs Reviewed - No data to display  EKG   Radiology No results found.  Procedures Procedures (including critical care time)  Medications Ordered in UC Medications - No data to display  Initial Impression / Assessment and Plan / UC Course  I have reviewed the triage vital signs and the nursing notes.  Pertinent labs & imaging results that were available during my care of the patient were reviewed by me and considered in my medical decision making (see chart for details).    Assessment negative for red flags or concerns.  No focal neurodeficits.  Numbness and tingling in her extremities possible radiculopathy versus peripheral neuropathy.  We will treat with naproxen twice a day for the next 3 to 5 days.  May use heat, ice, or alternate between heat and ice.  Fatigue with no obvious or known cause.  Encourage fluids and rest.  Recommend following up with primary care as soon as  possible for reevaluation.  Strict ED follow-up for any worsening symptoms. Final Clinical Impressions(s) / UC Diagnoses   Final diagnoses:  Fatigue, unspecified type  Radiculopathy of cervicothoracic region     Discharge Instructions      Take the Naproxen twice a day for the next 3-5 days.    You can use heat, ice, or alternate between heat and ice.    Follow up with your primary care office as soon as possible.   Return or go to the Emergency Department if symptoms worsen or do not improve in the next few days.      ED Prescriptions     Medication Sig Dispense Auth. Provider   naproxen (NAPROSYN) 500 MG tablet Take 1 tablet (500 mg total) by mouth 2 (two) times daily. 30 tablet Pearson Forster, NP      PDMP not reviewed this encounter.   Pearson Forster, NP 10/12/20 1154

## 2020-10-12 NOTE — Discharge Instructions (Addendum)
Take the Naproxen twice a day for the next 3-5 days.    You can use heat, ice, or alternate between heat and ice.    Follow up with your primary care office as soon as possible.   Return or go to the Emergency Department if symptoms worsen or do not improve in the next few days.

## 2020-10-12 NOTE — ED Triage Notes (Signed)
Pt presents with fatigue and Numbness/Tingling in fingers & toes X 1 week.

## 2020-10-17 ENCOUNTER — Other Ambulatory Visit: Payer: Self-pay

## 2020-10-19 ENCOUNTER — Encounter: Payer: Self-pay | Admitting: Family Medicine

## 2020-10-19 ENCOUNTER — Other Ambulatory Visit: Payer: Self-pay

## 2020-10-19 ENCOUNTER — Ambulatory Visit: Payer: BC Managed Care – PPO | Admitting: Family Medicine

## 2020-10-19 VITALS — BP 128/78 | HR 88 | Temp 98.0°F | Wt 214.0 lb

## 2020-10-19 DIAGNOSIS — G629 Polyneuropathy, unspecified: Secondary | ICD-10-CM | POA: Diagnosis not present

## 2020-10-19 LAB — BASIC METABOLIC PANEL
BUN: 15 mg/dL (ref 6–23)
CO2: 32 mEq/L (ref 19–32)
Calcium: 8.8 mg/dL (ref 8.4–10.5)
Chloride: 100 mEq/L (ref 96–112)
Creatinine, Ser: 0.83 mg/dL (ref 0.40–1.20)
GFR: 72.18 mL/min (ref >60.00)
Glucose, Bld: 90 mg/dL (ref 70–99)
Potassium: 3.6 mEq/L (ref 3.5–5.1)
Sodium: 139 mEq/L (ref 135–145)

## 2020-10-19 LAB — T4, FREE: Free T4: 0.74 ng/dL (ref 0.60–1.60)

## 2020-10-19 LAB — TSH: TSH: 1.24 u[IU]/mL (ref 0.35–5.50)

## 2020-10-19 LAB — T3, FREE: T3, Free: 3.5 pg/mL (ref 2.3–4.2)

## 2020-10-19 LAB — VITAMIN B12: Vitamin B-12: 247 pg/mL (ref 211–911)

## 2020-10-19 LAB — HEMOGLOBIN A1C: Hgb A1c MFr Bld: 6 % (ref 4.6–6.5)

## 2020-10-19 NOTE — Progress Notes (Signed)
   Subjective:    Patient ID: Kiara Ramirez, female    DOB: 07/18/1950, 70 y.o.   MRN: [id number]  HPI Here to follow up an urgent care visit on 10-12-20 where she complained of numbness and tingling in both hands and the left foot. She has also been feeling some generalized fatigue fo rthe past 3 weeks. When we drew labs at her wellness exam in February, her B12 level was borderline low. No testing was done at the urgent care, but she was given Naproxen to try. She thinks this has helped a little. She does not use alcohol at all.    Review of Systems  Constitutional:  Positive for fatigue.  Respiratory: Negative.    Cardiovascular: Negative.   Musculoskeletal:  Negative for back pain and neck pain.  Neurological:  Positive for numbness. Negative for weakness.      Objective:   Physical Exam Constitutional:      Appearance: Normal appearance.  Cardiovascular:     Rate and Rhythm: Normal rate and regular rhythm.     Pulses: Normal pulses.     Heart sounds: Normal heart sounds.  Pulmonary:     Effort: Pulmonary effort is normal.     Breath sounds: Normal breath sounds.  Neurological:     Mental Status: She is alert.          Assessment & Plan:  Recent onset of peripheral neuropathy and fatigue. The most likely etiology is a low B12, but we will send her for lab testing today to evaluate further.  Alysia Penna, MD

## 2020-10-20 ENCOUNTER — Ambulatory Visit: Payer: BC Managed Care – PPO | Admitting: Family Medicine

## 2021-02-12 ENCOUNTER — Encounter (HOSPITAL_COMMUNITY): Payer: Self-pay | Admitting: *Deleted

## 2021-02-12 ENCOUNTER — Ambulatory Visit (HOSPITAL_COMMUNITY)
Admission: EM | Admit: 2021-02-12 | Discharge: 2021-02-12 | Disposition: A | Discharge status: Home / Self Care | Payer: BC Managed Care – PPO

## 2021-02-12 ENCOUNTER — Other Ambulatory Visit: Payer: Self-pay

## 2021-02-12 DIAGNOSIS — S161XXA Strain of muscle, fascia and tendon at neck level, initial encounter: Secondary | ICD-10-CM

## 2021-02-12 NOTE — ED Provider Notes (Signed)
[place]    CSN: [id number] Arrival date & time: 02/12/21  1030      History   Chief Complaint Chief Complaint  Patient presents with   Neck Pain   Shoulder Pain   Leg Problem    HPI Kiara Ramirez is a 70 y.o. female. On 02/09/21, she reports she felt tired while at work and after work she went to bed and rested. Woke up the next day feeling better, no longer feels fatigued. Did a rapid home covid test this morning that was negative. Denies any other symptoms associated with fatigue.   02/10/21 felt some tightness in her upper shoulders/lower neck associated with intermittent tingling feeling in her fingers. Does not have tingling feeling at this time. Can resolve the tingling by moving her hands and arms around. Has had this problem intermittently. Does sit at a desk in front of a computer all day at work and doesn't get up and move as much as she should.    Neck Pain Associated symptoms: no fever and no weakness   Shoulder Pain Associated symptoms: neck pain   Associated symptoms: no fever    Past Medical History:  Diagnosis Date   Allergy    Anemia    Anxiety    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 meds   B12 deficiency    GERD (gastroesophageal reflux disease)    Gout    Hypertension     Patient Active Problem List   Diagnosis Date Noted   GERD (gastroesophageal reflux disease) 06/09/2019   Gout 08/13/2018   Postmenopausal bleeding 04/30/2018   Post-menopausal bleeding 04/30/2018   Chronic pain of left knee 10/06/2017   HYPERTROPHY OF TONSILS ALONE 03/07/2008   ANXIETY 04/20/2007   Weakness generalized 04/01/2007   B12 deficiency 02/25/2007   Essential hypertension 01/23/2007   HEADACHE 01/23/2007    Past Surgical History:  Procedure Laterality Date   COLONOSCOPY  12/16/2018   per Dr. Fuller Plan, incomplete exam     CYSTOSCOPY  04/30/2018   Procedure: CYSTOSCOPY;  Surgeon: Everett Graff, MD;  Location: [facility];  Service: Gynecology;;   DG BARIUM ENEMA  ([place] HX)  01/18/2019   per Dr. Fuller Plan, incomplete exam, get CT colonography in 5 years    HYSTERECTOMY ABDOMINAL WITH SALPINGO-OOPHORECTOMY Bilateral 04/30/2018   Procedure: TOTAL ABDOMINAL HYSTERECTOMY WITH BILATERAL SALPINGO-OOPHORECTOMY;  Surgeon: Everett Graff, MD;  Location: [facility];  Service: Gynecology;  Laterality: Bilateral;   LAPAROSCOPY  04/30/2018   Procedure: LAPAROSCOPY DIAGNOSTIC;  Surgeon: Everett Graff, MD;  Location: [facility];  Service: Gynecology;;   TUBAL LIGATION      OB History   No obstetric history on file.      Home Medications    Prior to Admission medications   Medication Sig Start Date End Date Taking? Authorizing Provider  allopurinol (ZYLOPRIM) 100 MG tablet Take 1 tablet (100 mg total) by mouth daily. 06/07/20   Laurey Morale, MD  aspirin 81 MG tablet Take 81 mg by mouth daily.    [provider]  clobetasol ointment (TEMOVATE) 0.05 % clobetasol 0.05 % topical ointment  APPLY A THIN LAYER TO THE AFFECTED AREA TWICE DAILY FOR 2 WEEKS    [provider]  hydrochlorothiazide (HYDRODIURIL) 25 MG tablet Take 1 tablet (25 mg total) by mouth daily. 06/06/20   Laurey Morale, MD  naproxen (NAPROSYN) 500 MG tablet Take 1 tablet (500 mg total) by mouth 2 (two) times daily. 10/12/20   Pearson Forster,  NP  potassium chloride (KLOR-CON) 10 MEQ tablet Take 2 tablets (20 mEq total) by mouth 2 (two) times daily. 06/06/20   Laurey Morale, MD    Family History Family History  Problem Relation Age of Onset   Heart failure Mother        No details   Anemia Father    Breast cancer Sister    Cancer Other        colon/endometrial   Hypertension Other        family hx   Arthritis Other        family hx   Diabetes Other        family hx   Colon cancer Neg Hx    Esophageal cancer Neg Hx    Stomach cancer Neg Hx    Rectal cancer Neg Hx     Social History Social History   Tobacco Use   Smoking status: Never   Smokeless tobacco: Never  Vaping  Use   Vaping Use: Never used  Substance Use Topics   Alcohol use: No    Alcohol/week: 0.0 standard drinks   Drug use: No     Allergies   Ace inhibitors, Hydrocodone-acetaminophen, and Oxycodone   Review of Systems Review of Systems  Constitutional:  Negative for activity change, chills and fever.  HENT:  Negative for congestion, rhinorrhea and sore throat.   Respiratory:  Negative for cough and shortness of breath.   Musculoskeletal:  Positive for neck pain.  Neurological:  Negative for weakness.       Tingling in fingers    Physical Exam Triage Vital Signs ED Triage Vitals  Enc Vitals Group     BP 02/12/21 1231 134/74     Pulse Rate 02/12/21 1231 70     Resp 02/12/21 1231 18     Temp 02/12/21 1231 98.2 F (36.8 C)     Temp src --      SpO2 02/12/21 1231 92 %     Weight --      Height --      Head Circumference --      Peak Flow --      Pain Score 02/12/21 1229 5     Pain Loc --      Pain Edu? --      Excl. in [place]? --    No data found.  Updated Vital Signs BP 134/74   Pulse 70   Temp 98.2 F (36.8 C)   Resp 18   LMP  (LMP Unknown)   SpO2 92%   Visual Acuity Right Eye Distance:   Left Eye Distance:   Bilateral Distance:    Right Eye Near:   Left Eye Near:    Bilateral Near:     Physical Exam Constitutional:      General: She is not in acute distress.    Appearance: Normal appearance. She is obese. She is not ill-appearing.  Pulmonary:     Effort: Pulmonary effort is normal.  Musculoskeletal:     Right shoulder: Normal.     Left shoulder: Normal.     Cervical back: Tenderness present. No deformity or bony tenderness. Normal range of motion.     Comments: B upper back/neck trapezius area muscles are tight, minimally tender to palpation. BUE with strength intact  Neurological:     Mental Status: She is alert.     Gait: Gait abnormal.     UC Treatments / Results  Labs (all labs ordered are listed,  but only abnormal results are  displayed) Labs Reviewed - No data to display  EKG   Radiology No results found.  Procedures Procedures (including critical care time)  Medications Ordered in UC Medications - No data to display  Initial Impression / Assessment and Plan / UC Course  I have reviewed the triage vital signs and the nursing notes.  Pertinent labs & imaging results that were available during my care of the patient were reviewed by me and considered in my medical decision making (see chart for details).   Pt does not appear to have acute illness. She asks if she is ok to get flu and covid vaccines and I reassured her she is well enough to get them. I suspect intermittent tingling in fingers is related to cervical spine and/or muscles of neck/shoulders due to position at work. I reviewed stretching and ROM exercises with her. Pt also reports L knee pain she is discussing with PCP. At discharge, mentions frequent burping- I encouraged her to discuss with PCP.   Final Clinical Impressions(s) / UC Diagnoses   Final diagnoses:  Acute strain of neck muscle, initial encounter     Discharge Instructions      Work on taking more breaks when at work and stretching your shoulders and neck. You might consider getting a massage. Talk with your primary doctor about your burping and your knee pain. You are well enough to get your influenza and COVID booster vaccines.    ED Prescriptions   None    PDMP not reviewed this encounter.   Carvel Getting, NP 02/12/21 1306

## 2021-02-12 NOTE — Discharge Instructions (Signed)
Work on taking more breaks when at work and stretching your shoulders and neck. You might consider getting a massage. Talk with your primary doctor about your burping and your knee pain. You are well enough to get your influenza and COVID booster vaccines.

## 2021-02-12 NOTE — ED Triage Notes (Signed)
Pt reports she had a neg COVID test but still feels bad. Pt reports having neck pain ,shoulder pain and a Knot on Lt leg.

## 2021-03-30 ENCOUNTER — Encounter (HOSPITAL_COMMUNITY): Payer: Self-pay

## 2021-03-30 ENCOUNTER — Other Ambulatory Visit: Payer: Self-pay

## 2021-03-30 ENCOUNTER — Ambulatory Visit (HOSPITAL_COMMUNITY)
Admission: EM | Admit: 2021-03-30 | Discharge: 2021-03-30 | Disposition: A | Discharge status: Home / Self Care | Payer: BC Managed Care – PPO | Attending: Emergency Medicine | Admitting: Emergency Medicine

## 2021-03-30 DIAGNOSIS — J029 Acute pharyngitis, unspecified: Secondary | ICD-10-CM

## 2021-03-30 LAB — POCT RAPID STREP A, ED / UC: Streptococcus, Group A Screen (Direct): NEGATIVE

## 2021-03-30 MED ORDER — LIDOCAINE VISCOUS HCL 2 % MT SOLN: 15.0000 mL | OROMUCOSAL | 0 refills | Status: AC | PRN

## 2021-03-30 NOTE — ED Triage Notes (Signed)
Pt presents with sore throat since yesterday with some post nasal drainage.

## 2021-03-30 NOTE — Discharge Instructions (Addendum)
Your rapid strep test today was negative, your throat sample has been sent to the lab to see if it will grow bacteria, if this occurs you will be notified and antibiotic be sent in for use  In the meantime we must treat this as a viral symptom and manage the symptoms  You may gargle and spit lidocaine solution every 4 hours as needed for temporary relief of your sore throat  May use ibuprofen every 8 hours as needed in addition to Tylenol for additional comfort  You may follow-up at urgent care as needed

## 2021-03-30 NOTE — ED Provider Notes (Signed)
[place]    CSN: [id number] Arrival date & time: 03/30/21  1055      History   Chief Complaint Chief Complaint  Patient presents with   Sore Throat    HPI Alejandrina Raimer is a 70 y.o. female.   Patient presents with nasal congestion and sore throat for 1 day.  Tolerating food and liquids.  Has attempted home remedies which was somewhat helpful.  Known sick contact as husband is positive for COVID but she endorses that her PCR testing was negative.  Testing completed 1 day ago.  History of anemia, anxiety, arthritis, GERD, gout, hypertension.  Denies fever, chills, body aches, cough, shortness of breath, wheezing, abdominal pain, headaches.  Past Medical History:  Diagnosis Date   Allergy    Anemia    Anxiety    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 meds   B12 deficiency    GERD (gastroesophageal reflux disease)    Gout    Hypertension     Patient Active Problem List   Diagnosis Date Noted   GERD (gastroesophageal reflux disease) 06/09/2019   Gout 08/13/2018   Postmenopausal bleeding 04/30/2018   Post-menopausal bleeding 04/30/2018   Chronic pain of left knee 10/06/2017   HYPERTROPHY OF TONSILS ALONE 03/07/2008   ANXIETY 04/20/2007   Weakness generalized 04/01/2007   B12 deficiency 02/25/2007   Essential hypertension 01/23/2007   HEADACHE 01/23/2007    Past Surgical History:  Procedure Laterality Date   COLONOSCOPY  12/16/2018   per Dr. Fuller Plan, incomplete exam     CYSTOSCOPY  04/30/2018   Procedure: CYSTOSCOPY;  Surgeon: Everett Graff, MD;  Location: [facility];  Service: Gynecology;;   DG BARIUM ENEMA ([place] HX)  01/18/2019   per Dr. Fuller Plan, incomplete exam, get CT colonography in 5 years    HYSTERECTOMY ABDOMINAL WITH SALPINGO-OOPHORECTOMY Bilateral 04/30/2018   Procedure: TOTAL ABDOMINAL HYSTERECTOMY WITH BILATERAL SALPINGO-OOPHORECTOMY;  Surgeon: Everett Graff, MD;  Location: [facility];  Service: Gynecology;  Laterality: Bilateral;   LAPAROSCOPY  04/30/2018    Procedure: LAPAROSCOPY DIAGNOSTIC;  Surgeon: Everett Graff, MD;  Location: [facility];  Service: Gynecology;;   TUBAL LIGATION      OB History   No obstetric history on file.      Home Medications    Prior to Admission medications   Medication Sig Start Date End Date Taking? Authorizing Provider  allopurinol (ZYLOPRIM) 100 MG tablet Take 1 tablet (100 mg total) by mouth daily. 06/07/20   Laurey Morale, MD  aspirin 81 MG tablet Take 81 mg by mouth daily.    [provider]  clobetasol ointment (TEMOVATE) 0.05 % clobetasol 0.05 % topical ointment  APPLY A THIN LAYER TO THE AFFECTED AREA TWICE DAILY FOR 2 WEEKS    [provider]  hydrochlorothiazide (HYDRODIURIL) 25 MG tablet Take 1 tablet (25 mg total) by mouth daily. 06/06/20   Laurey Morale, MD  naproxen (NAPROSYN) 500 MG tablet Take 1 tablet (500 mg total) by mouth 2 (two) times daily. 10/12/20   Pearson Forster, NP  potassium chloride (KLOR-CON) 10 MEQ tablet Take 2 tablets (20 mEq total) by mouth 2 (two) times daily. 06/06/20   Laurey Morale, MD    Family History Family History  Problem Relation Age of Onset   Heart failure Mother        No details   Anemia Father    Breast cancer Sister    Cancer Other        colon/endometrial  Hypertension Other        family hx   Arthritis Other        family hx   Diabetes Other        family hx   Colon cancer Neg Hx    Esophageal cancer Neg Hx    Stomach cancer Neg Hx    Rectal cancer Neg Hx     Social History Social History   Tobacco Use   Smoking status: Never   Smokeless tobacco: Never  Vaping Use   Vaping Use: Never used  Substance Use Topics   Alcohol use: No    Alcohol/week: 0.0 standard drinks   Drug use: No     Allergies   Ace inhibitors, Hydrocodone-acetaminophen, and Oxycodone   Review of Systems Review of Systems  Constitutional: Negative.   HENT:  Positive for congestion and sore throat. Negative for dental problem, drooling, ear  discharge, ear pain, facial swelling, hearing loss, mouth sores, nosebleeds, postnasal drip, rhinorrhea, sinus pressure, sinus pain, sneezing, tinnitus, trouble swallowing and voice change.   Respiratory: Negative.    Cardiovascular: Negative.   Skin: Negative.   Neurological: Negative.     Physical Exam Triage Vital Signs ED Triage Vitals  Enc Vitals Group     BP 03/30/21 1237 (!) 143/80     Pulse Rate 03/30/21 1237 75     Resp 03/30/21 1237 18     Temp 03/30/21 1237 98.1 F (36.7 C)     Temp Source 03/30/21 1237 Oral     SpO2 03/30/21 1237 95 %     Weight --      Height --      Head Circumference --      Peak Flow --      Pain Score 03/30/21 1241 6     Pain Loc --      Pain Edu? --      Excl. in [place]? --    No data found.  Updated Vital Signs BP (!) 143/80 (BP Location: Left Arm)    Pulse 75    Temp 98.1 F (36.7 C) (Oral)    Resp 18    LMP  (LMP Unknown)    SpO2 95%   Visual Acuity Right Eye Distance:   Left Eye Distance:   Bilateral Distance:    Right Eye Near:   Left Eye Near:    Bilateral Near:     Physical Exam Constitutional:      Appearance: She is well-developed and normal weight.  HENT:     Head: Normocephalic.     Right Ear: Tympanic membrane and ear canal normal.     Left Ear: Tympanic membrane and ear canal normal.     Mouth/Throat:     Mouth: Mucous membranes are moist.     Pharynx: Posterior oropharyngeal erythema present.     Tonsils: No tonsillar exudate. 2+ on the right. 2+ on the left.  Cardiovascular:     Rate and Rhythm: Normal rate and regular rhythm.  Musculoskeletal:     Cervical back: Normal range of motion.  Lymphadenopathy:     Cervical: Cervical adenopathy present.  Skin:    General: Skin is warm and dry.  Neurological:     General: No focal deficit present.     Mental Status: She is alert and oriented to person, place, and time.  Psychiatric:        Mood and Affect: Mood normal.        Behavior: Behavior normal.  UC  Treatments / Results  Labs (all labs ordered are listed, but only abnormal results are displayed) Labs Reviewed - No data to display  EKG   Radiology No results found.  Procedures Procedures (including critical care time)  Medications Ordered in UC Medications - No data to display  Initial Impression / Assessment and Plan / UC Course  I have reviewed the triage vital signs and the nursing notes.  Pertinent labs & imaging results that were available during my care of the patient were reviewed by me and considered in my medical decision making (see chart for details).  Sore throat  1.  Lidocaine viscous 2% 15 mils every 4 hours 2.  Over-the-counter medications and home remedies for remaining 3.  Urgent care follow-up as needed 4.  Rapid strep negative, sent for culture Final Clinical Impressions(s) / UC Diagnoses   Final diagnoses:  None   Discharge Instructions   None    ED Prescriptions   None    PDMP not reviewed this encounter.   Hans Eden, NP 03/30/21 1326

## 2021-04-01 ENCOUNTER — Ambulatory Visit (HOSPITAL_COMMUNITY)
Admission: EM | Admit: 2021-04-01 | Discharge: 2021-04-01 | Disposition: A | Discharge status: Home / Self Care | Payer: BC Managed Care – PPO | Attending: Student | Admitting: Student

## 2021-04-01 ENCOUNTER — Encounter (HOSPITAL_COMMUNITY): Payer: Self-pay

## 2021-04-01 ENCOUNTER — Other Ambulatory Visit: Payer: Self-pay

## 2021-04-01 DIAGNOSIS — Z20822 Contact with and (suspected) exposure to covid-19: Secondary | ICD-10-CM | POA: Insufficient documentation

## 2021-04-01 LAB — CULTURE, GROUP A STREP (THRC)

## 2021-04-01 NOTE — ED Notes (Signed)
Covid swab in lab

## 2021-04-01 NOTE — ED Provider Notes (Signed)
[place]    CSN: [id number] Arrival date & time: 04/01/21  1140      History   Chief Complaint Chief Complaint  Patient presents with   Nasal Congestion   Cough    HPI Kiara Ramirez is a 70 y.o. female presenting with nasal congestion and sore throat for about 3 days.  Medical history hypertension as below.  Describes nasal congestion, nonproductive cough, sore throat that has resolved on its own.  Has been using the lidocaine mouthwash as needed with some improvement from her last visit with [place] 12/16.   Her husband is COVID-positive, and she had 2 positive home COVID tests, though she wants a PCR test to confirm.  Denies fever/chills, myalgias, nausea/vomiting/diarrhea, shortness of breath, chest pain, dizziness.  HPI  Past Medical History:  Diagnosis Date   Allergy    Anemia    Anxiety    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 meds   B12 deficiency    GERD (gastroesophageal reflux disease)    Gout    Hypertension     Patient Active Problem List   Diagnosis Date Noted   GERD (gastroesophageal reflux disease) 06/09/2019   Gout 08/13/2018   Postmenopausal bleeding 04/30/2018   Post-menopausal bleeding 04/30/2018   Chronic pain of left knee 10/06/2017   HYPERTROPHY OF TONSILS ALONE 03/07/2008   ANXIETY 04/20/2007   Weakness generalized 04/01/2007   B12 deficiency 02/25/2007   Essential hypertension 01/23/2007   HEADACHE 01/23/2007    Past Surgical History:  Procedure Laterality Date   COLONOSCOPY  12/16/2018   per Dr. Fuller Plan, incomplete exam     CYSTOSCOPY  04/30/2018   Procedure: CYSTOSCOPY;  Surgeon: Everett Graff, MD;  Location: [facility];  Service: Gynecology;;   DG BARIUM ENEMA ([place] HX)  01/18/2019   per Dr. Fuller Plan, incomplete exam, get CT colonography in 5 years    HYSTERECTOMY ABDOMINAL WITH SALPINGO-OOPHORECTOMY Bilateral 04/30/2018   Procedure: TOTAL ABDOMINAL HYSTERECTOMY WITH BILATERAL SALPINGO-OOPHORECTOMY;  Surgeon: Everett Graff, MD;  Location: [facility];  Service: Gynecology;  Laterality: Bilateral;   LAPAROSCOPY  04/30/2018   Procedure: LAPAROSCOPY DIAGNOSTIC;  Surgeon: Everett Graff, MD;  Location: [facility];  Service: Gynecology;;   TUBAL LIGATION      OB History   No obstetric history on file.      Home Medications    Prior to Admission medications   Medication Sig Start Date End Date Taking? Authorizing Provider  allopurinol (ZYLOPRIM) 100 MG tablet Take 1 tablet (100 mg total) by mouth daily. 06/07/20   Laurey Morale, MD  aspirin 81 MG tablet Take 81 mg by mouth daily.    [provider]  clobetasol ointment (TEMOVATE) 0.05 % clobetasol 0.05 % topical ointment  APPLY A THIN LAYER TO THE AFFECTED AREA TWICE DAILY FOR 2 WEEKS    [provider]  hydrochlorothiazide (HYDRODIURIL) 25 MG tablet Take 1 tablet (25 mg total) by mouth daily. 06/06/20   Laurey Morale, MD  lidocaine (XYLOCAINE) 2 % solution Use as directed 15 mLs in the mouth or throat as needed for mouth pain. 03/30/21   White, Leitha Schuller, NP  naproxen (NAPROSYN) 500 MG tablet Take 1 tablet (500 mg total) by mouth 2 (two) times daily. 10/12/20   Pearson Forster, NP  potassium chloride (KLOR-CON) 10 MEQ tablet Take 2 tablets (20 mEq total) by mouth 2 (two) times daily. 06/06/20   Laurey Morale, MD    Family History Family History  Problem  Relation Age of Onset   Heart failure Mother        No details   Anemia Father    Breast cancer Sister    Cancer Other        colon/endometrial   Hypertension Other        family hx   Arthritis Other        family hx   Diabetes Other        family hx   Colon cancer Neg Hx    Esophageal cancer Neg Hx    Stomach cancer Neg Hx    Rectal cancer Neg Hx     Social History Social History   Tobacco Use   Smoking status: Never   Smokeless tobacco: Never  Vaping Use   Vaping Use: Never used  Substance Use Topics   Alcohol use: No    Alcohol/week: 0.0 standard drinks   Drug use: No     Allergies    Ace inhibitors, Hydrocodone-acetaminophen, and Oxycodone   Review of Systems Review of Systems  Constitutional:  Negative for appetite change, chills and fever.  HENT:  Positive for congestion and sore throat. Negative for ear pain, rhinorrhea, sinus pressure and sinus pain.   Eyes:  Negative for redness and visual disturbance.  Respiratory:  Positive for cough. Negative for chest tightness, shortness of breath and wheezing.   Cardiovascular:  Negative for chest pain and palpitations.  Gastrointestinal:  Negative for abdominal pain, constipation, diarrhea, nausea and vomiting.  Genitourinary:  Negative for dysuria, frequency and urgency.  Musculoskeletal:  Negative for myalgias.  Neurological:  Negative for dizziness, weakness and headaches.  Psychiatric/Behavioral:  Negative for confusion.   All other systems reviewed and are negative.   Physical Exam Triage Vital Signs ED Triage Vitals [04/01/21 1410]  Enc Vitals Group     BP (!) 136/95     Pulse Rate 96     Resp 16     Temp 97.9 F (36.6 C)     Temp Source Oral     SpO2 100 %     Weight      Height      Head Circumference      Peak Flow      Pain Score      Pain Loc      Pain Edu?      Excl. in [place]?    No data found.  Updated Vital Signs BP (!) 136/95 (BP Location: Left Arm)    Pulse 96    Temp 97.9 F (36.6 C) (Oral)    Resp 16    LMP  (LMP Unknown)    SpO2 100%   Visual Acuity Right Eye Distance:   Left Eye Distance:   Bilateral Distance:    Right Eye Near:   Left Eye Near:    Bilateral Near:     Physical Exam Vitals reviewed.  Constitutional:      General: She is not in acute distress.    Appearance: Normal appearance. She is not ill-appearing.  HENT:     Head: Normocephalic and atraumatic.     Right Ear: Tympanic membrane, ear canal and external ear normal. No tenderness. No middle ear effusion. There is no impacted cerumen. Tympanic membrane is not perforated, erythematous, retracted or bulging.      Left Ear: Tympanic membrane, ear canal and external ear normal. No tenderness.  No middle ear effusion. There is no impacted cerumen. Tympanic membrane is not perforated, erythematous, retracted or bulging.  Nose: Nose normal. No congestion.     Mouth/Throat:     Mouth: Mucous membranes are moist.     Pharynx: Uvula midline. No oropharyngeal exudate or posterior oropharyngeal erythema.  Eyes:     Extraocular Movements: Extraocular movements intact.     Pupils: Pupils are equal, round, and reactive to light.  Cardiovascular:     Rate and Rhythm: Normal rate and regular rhythm.     Heart sounds: Normal heart sounds.  Pulmonary:     Effort: Pulmonary effort is normal.     Breath sounds: Normal breath sounds. No decreased breath sounds, wheezing, rhonchi or rales.  Abdominal:     Palpations: Abdomen is soft.     Tenderness: There is no abdominal tenderness. There is no guarding or rebound.  Lymphadenopathy:     Cervical: No cervical adenopathy.     Right cervical: No superficial cervical adenopathy.    Left cervical: No superficial cervical adenopathy.  Neurological:     General: No focal deficit present.     Mental Status: She is alert and oriented to person, place, and time.  Psychiatric:        Mood and Affect: Mood normal.        Behavior: Behavior normal.        Thought Content: Thought content normal.        Judgment: Judgment normal.     UC Treatments / Results  Labs (all labs ordered are listed, but only abnormal results are displayed) Labs Reviewed  SARS CORONAVIRUS 2 (TAT 6-24 HRS)    EKG   Radiology No results found.  Procedures Procedures (including critical care time)  Medications Ordered in UC Medications - No data to display  Initial Impression / Assessment and Plan / UC Course  I have reviewed the triage vital signs and the nursing notes.  Pertinent labs & imaging results that were available during my care of the patient were reviewed by me and  considered in my medical decision making (see chart for details).     This patient is a very pleasant 70 y.o. year old female presenting with viral syndrome following exposure to covid-19. Today this pt is afebrile nontachycardic nontachypneic, oxygenating well on room air, no wheezes rhonchi or rales. Denies history pulm ds. Exposure to husband who has covid-19. She has had two positive home tests, wants a PCR to confirm, sent. She would be a candidate for Molnupiravir, she is unsure if she desires this as her course of illness is mild. She declines prescription medications for symptomatic relief. ED return precautions discussed. Patient verbalizes understanding and agreement. She is fully vaccinated for covid-19.   Final Clinical Impressions(s) / UC Diagnoses   Final diagnoses:  Suspected COVID-19 virus infection  Exposure to confirmed case of COVID-19  Encounter for screening laboratory testing for COVID-19 virus     Discharge Instructions      -With a virus, you're typically contagious for 5-7 days, or as long as you're having fevers.  -You can take Tylenol up to 1000 mg 3 times daily for fevers/chills     ED Prescriptions   None    PDMP not reviewed this encounter.   Hazel Sams, PA-C 04/01/21 1454

## 2021-04-01 NOTE — Discharge Instructions (Addendum)
-  With a virus, you're typically contagious for 5-7 days, or as long as you're having fevers.  -You can take Tylenol up to 1000 mg 3 times daily for fevers/chills

## 2021-04-01 NOTE — ED Triage Notes (Signed)
Pt presents for nasal congestion and cough x 2 days. Husband tested positive for COVID a couple of days ago.

## 2021-04-02 LAB — SARS CORONAVIRUS 2 (TAT 6-24 HRS): SARS Coronavirus 2: POSITIVE — AB

## 2021-04-18 ENCOUNTER — Emergency Department (HOSPITAL_BASED_OUTPATIENT_CLINIC_OR_DEPARTMENT_OTHER): Payer: BC Managed Care – PPO | Admitting: Radiology

## 2021-04-18 ENCOUNTER — Other Ambulatory Visit: Payer: Self-pay

## 2021-04-18 ENCOUNTER — Emergency Department (HOSPITAL_BASED_OUTPATIENT_CLINIC_OR_DEPARTMENT_OTHER)
Admission: EM | Admit: 2021-04-18 | Discharge: 2021-04-18 | Disposition: A | Discharge status: Home / Self Care | Payer: BC Managed Care – PPO | Attending: Emergency Medicine | Admitting: Emergency Medicine

## 2021-04-18 ENCOUNTER — Encounter (HOSPITAL_BASED_OUTPATIENT_CLINIC_OR_DEPARTMENT_OTHER): Payer: Self-pay | Admitting: Emergency Medicine

## 2021-04-18 DIAGNOSIS — Z7982 Long term (current) use of aspirin: Secondary | ICD-10-CM | POA: Diagnosis not present

## 2021-04-18 DIAGNOSIS — Z8616 Personal history of COVID-19: Secondary | ICD-10-CM | POA: Insufficient documentation

## 2021-04-18 DIAGNOSIS — R002 Palpitations: Secondary | ICD-10-CM | POA: Diagnosis present

## 2021-04-18 LAB — CBC
HCT: 38.6 % (ref 36.0–46.0)
Hemoglobin: 12.3 g/dL (ref 12.0–15.0)
MCH: 28.1 pg (ref 26.0–34.0)
MCHC: 31.9 g/dL (ref 30.0–36.0)
MCV: 88.3 fL (ref 80.0–100.0)
Platelets: 388 10*3/uL (ref 150–400)
RBC: 4.37 MIL/uL (ref 3.87–5.11)
RDW: 14.5 % (ref 11.5–15.5)
WBC: 10.1 10*3/uL (ref 4.0–10.5)
nRBC: 0 % (ref 0.0–0.2)

## 2021-04-18 LAB — BASIC METABOLIC PANEL
Anion gap: 7 (ref 5–15)
BUN: 16 mg/dL (ref 8–23)
CO2: 30 mmol/L (ref 22–32)
Calcium: 9 mg/dL (ref 8.9–10.3)
Chloride: 100 mmol/L (ref 98–111)
Creatinine, Ser: 0.92 mg/dL (ref 0.44–1.00)
GFR, Estimated: >60 mL/min (ref >60)
Glucose, Bld: 114 mg/dL — ABNORMAL HIGH (ref 70–99)
Potassium: 3.4 mmol/L — ABNORMAL LOW (ref 3.5–5.1)
Sodium: 137 mmol/L (ref 135–145)

## 2021-04-18 LAB — TROPONIN I (HIGH SENSITIVITY)
Troponin I (High Sensitivity): 2 ng/L (ref <18)
Troponin I (High Sensitivity): 2 ng/L (ref <18)

## 2021-04-18 NOTE — ED Triage Notes (Signed)
Started having palpitation 30 minutes , chest tightness , denies shortness of breath,.

## 2021-04-18 NOTE — ED Provider Notes (Signed)
[facility] Provider Note   CSN: [id number] Arrival date & time: 04/18/21 1622  History Chief Complaint  Patient presents with   Palpitations   Chest Pain    Ladine Maske is a 71 y.o. female with history of anxiety reports she was sitting at her computer earlier today when she noted that her heart was racing. Her smart watch said her HR was ~100. She was not having any CP, SOB, N/V or dizziness. Symptoms improved over the course of an hour or so. She is symptom free at the time of my evaluation. She denies any recent fever but did have Covid a couple of weeks ago.    Home Medications Prior to Admission medications   Medication Sig Start Date End Date Taking? Authorizing Provider  allopurinol (ZYLOPRIM) 100 MG tablet Take 1 tablet (100 mg total) by mouth daily. 06/07/20   Laurey Morale, MD  aspirin 81 MG tablet Take 81 mg by mouth daily.    [provider]  clobetasol ointment (TEMOVATE) 0.05 % clobetasol 0.05 % topical ointment  APPLY A THIN LAYER TO THE AFFECTED AREA TWICE DAILY FOR 2 WEEKS    [provider]  hydrochlorothiazide (HYDRODIURIL) 25 MG tablet Take 1 tablet (25 mg total) by mouth daily. 06/06/20   Laurey Morale, MD  lidocaine (XYLOCAINE) 2 % solution Use as directed 15 mLs in the mouth or throat as needed for mouth pain. 03/30/21   White, Leitha Schuller, NP  naproxen (NAPROSYN) 500 MG tablet Take 1 tablet (500 mg total) by mouth 2 (two) times daily. 10/12/20   Pearson Forster, NP  potassium chloride (KLOR-CON) 10 MEQ tablet Take 2 tablets (20 mEq total) by mouth 2 (two) times daily. 06/06/20   Laurey Morale, MD     Allergies    Ace inhibitors, Hydrocodone-acetaminophen, and Oxycodone   Review of Systems   Review of Systems Please see HPI for pertinent positives and negatives  Physical Exam BP (!) 153/91    Pulse 77    Temp 97.8 F (36.6 C)    Resp 17    Wt 90.7 kg    LMP  (LMP Unknown)    SpO2 98%    BMI 36.00 kg/m    Physical Exam Vitals and nursing note reviewed.  Constitutional:      Appearance: Normal appearance.  HENT:     Head: Normocephalic and atraumatic.     Nose: Nose normal.     Mouth/Throat:     Mouth: Mucous membranes are moist.  Eyes:     Extraocular Movements: Extraocular movements intact.     Conjunctiva/sclera: Conjunctivae normal.  Cardiovascular:     Rate and Rhythm: Normal rate.  Pulmonary:     Effort: Pulmonary effort is normal.     Breath sounds: Normal breath sounds.  Abdominal:     General: Abdomen is flat.     Palpations: Abdomen is soft.     Tenderness: There is no abdominal tenderness.  Musculoskeletal:        General: No swelling. Normal range of motion.     Cervical back: Neck supple.  Skin:    General: Skin is warm and dry.  Neurological:     General: No focal deficit present.     Mental Status: She is alert.  Psychiatric:        Mood and Affect: Mood normal.    ED Results / Procedures / Treatments   EKG EKG Interpretation  Date/Time:  Wednesday  April 18 2021 16:32:15 EST Ventricular Rate:  102 PR Interval:  192 QRS Duration: 82 QT Interval:  350 QTC Calculation: 456 R Axis:   -56 Text Interpretation: Sinus tachycardia Left anterior fascicular block Abnormal ECG When compared with ECG of 15-May-2020 10:32, Vent. rate has increased BY  35 BPM Confirmed by Calvert Cantor [phone]) on 04/18/2021 5:14:47 PM  Procedures Procedures  Medications Ordered in the ED Medications - No data to display  Initial Impression and Plan  Patient with mild tachycardia at home has since resolved. She has no concerning EKG findings. Labs done in triage showed normal CBC, BMP and Trop x 2. CXR is clear. She was advised to follow up with her PCP if symptoms return. RTED for any worsening or other concerns. Avoid stimulants such as caffeine.   ED Course       MDM Rules/Calculators/A&P Medical Decision Making Problems Addressed: Palpitations: acute illness or  injury  Amount and/or Complexity of Data Reviewed Labs: ordered. Decision-making details documented in ED Course. Radiology: ordered and independent interpretation performed. Decision-making details documented in ED Course. ECG/medicine tests: ordered and independent interpretation performed.    Final Clinical Impression(s) / ED Diagnoses Final diagnoses:  Palpitations    Rx / DC Orders ED Discharge Orders     None        Truddie Hidden, MD 04/18/21 2204

## 2021-04-20 ENCOUNTER — Other Ambulatory Visit: Payer: Self-pay

## 2021-04-20 ENCOUNTER — Encounter (HOSPITAL_COMMUNITY): Payer: Self-pay | Admitting: Emergency Medicine

## 2021-04-20 ENCOUNTER — Ambulatory Visit (HOSPITAL_COMMUNITY)
Admission: EM | Admit: 2021-04-20 | Discharge: 2021-04-20 | Disposition: A | Discharge status: Home / Self Care | Payer: BC Managed Care – PPO | Attending: Family Medicine | Admitting: Family Medicine

## 2021-04-20 DIAGNOSIS — R002 Palpitations: Secondary | ICD-10-CM

## 2021-04-20 DIAGNOSIS — R142 Eructation: Secondary | ICD-10-CM

## 2021-04-20 DIAGNOSIS — M25511 Pain in right shoulder: Secondary | ICD-10-CM

## 2021-04-20 MED ORDER — OMEPRAZOLE 20 MG PO CPDR: 20.0000 mg | DELAYED_RELEASE_CAPSULE | Freq: Every day | ORAL | 0 refills | Status: DC

## 2021-04-20 NOTE — ED Triage Notes (Signed)
Had palpitations and was seen at [facility] on 1/4 and told everything was fine.   Reports when was fixing breakfast today heart started racing again for about 20-25 minutes.  Reports right shoulder pains and right arm pains for a week. Denies falls or injuries.

## 2021-04-20 NOTE — Discharge Instructions (Signed)
You were seen today for palpitations, back pain, and burping.  Your EKG looks good today, no sign of heart wall damage.  Since your work up in the ER 2 days ago was normal, I do not think you need to go back.  I have recommend an oral medication daily x 2 weeks to see if helps with burping, as this may be causing some of your symptoms.  You  may also get over the counter bean-o or gas-x.   You have an appointment on Monday with your pcp.  Please see him/her for further care and work up if your symptoms continue.  I recommend tyleol/motrin taken with food for your back/neck pain.  Also recommend you use a heating pad.

## 2021-04-20 NOTE — ED Provider Notes (Signed)
[place]    CSN: [id number] Arrival date & time: 04/20/21  2440      History   Chief Complaint Chief Complaint  Patient presents with   Shoulder Pain   Palpitations    HPI Kiara Ramirez is a 71 y.o. female.   This morning she was getting ready for work, and then started with heart palpitations.  Lasted about 30 mins;  Then started with burping, and has been doing that a lot lately.  Then started with right sided neck/shoulder pain.  This has continued off/on all morning.  No chest pain this morning.   2 days she was in the ER for chest pain, and palpitations.  Work up was normal, ekg was normal;  they did not do a urine test;   Her sister passed this time last year;  not sure if increased stress as a result;  concerned about her health overall;   Past Medical History:  Diagnosis Date   Allergy    Anemia    Anxiety    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 meds   B12 deficiency    GERD (gastroesophageal reflux disease)    Gout    Hypertension     Patient Active Problem List   Diagnosis Date Noted   GERD (gastroesophageal reflux disease) 06/09/2019   Gout 08/13/2018   Postmenopausal bleeding 04/30/2018   Post-menopausal bleeding 04/30/2018   Chronic pain of left knee 10/06/2017   HYPERTROPHY OF TONSILS ALONE 03/07/2008   ANXIETY 04/20/2007   Weakness generalized 04/01/2007   B12 deficiency 02/25/2007   Essential hypertension 01/23/2007   HEADACHE 01/23/2007    Past Surgical History:  Procedure Laterality Date   COLONOSCOPY  12/16/2018   per Dr. Fuller Plan, incomplete exam     CYSTOSCOPY  04/30/2018   Procedure: CYSTOSCOPY;  Surgeon: Everett Graff, MD;  Location: [facility];  Service: Gynecology;;   DG BARIUM ENEMA ([facility])  01/18/2019   per Dr. Fuller Plan, incomplete exam, get CT colonography in 5 years    HYSTERECTOMY ABDOMINAL WITH SALPINGO-OOPHORECTOMY Bilateral 04/30/2018   Procedure: TOTAL ABDOMINAL HYSTERECTOMY WITH BILATERAL SALPINGO-OOPHORECTOMY;  Surgeon:  Everett Graff, MD;  Location: [facility];  Service: Gynecology;  Laterality: Bilateral;   LAPAROSCOPY  04/30/2018   Procedure: LAPAROSCOPY DIAGNOSTIC;  Surgeon: Everett Graff, MD;  Location: [facility];  Service: Gynecology;;   TUBAL LIGATION      OB History   No obstetric history on file.      Home Medications    Prior to Admission medications   Medication Sig Start Date End Date Taking? Authorizing Provider  allopurinol (ZYLOPRIM) 100 MG tablet Take 1 tablet (100 mg total) by mouth daily. 06/07/20   Laurey Morale, MD  aspirin 81 MG tablet Take 81 mg by mouth daily.    [provider]  clobetasol ointment (TEMOVATE) 0.05 % clobetasol 0.05 % topical ointment  APPLY A THIN LAYER TO THE AFFECTED AREA TWICE DAILY FOR 2 WEEKS    [provider]  hydrochlorothiazide (HYDRODIURIL) 25 MG tablet Take 1 tablet (25 mg total) by mouth daily. 06/06/20   Laurey Morale, MD  lidocaine (XYLOCAINE) 2 % solution Use as directed 15 mLs in the mouth or throat as needed for mouth pain. 03/30/21   White, Leitha Schuller, NP  naproxen (NAPROSYN) 500 MG tablet Take 1 tablet (500 mg total) by mouth 2 (two) times daily. 10/12/20   Pearson Forster, NP  potassium chloride (KLOR-CON) 10 MEQ tablet Take 2 tablets (  20 mEq total) by mouth 2 (two) times daily. 06/06/20   Laurey Morale, MD    Family History Family History  Problem Relation Age of Onset   Heart failure Mother        No details   Anemia Father    Breast cancer Sister    Cancer Other        colon/endometrial   Hypertension Other        family hx   Arthritis Other        family hx   Diabetes Other        family hx   Colon cancer Neg Hx    Esophageal cancer Neg Hx    Stomach cancer Neg Hx    Rectal cancer Neg Hx     Social History Social History   Tobacco Use   Smoking status: Never   Smokeless tobacco: Never  Vaping Use   Vaping Use: Never used  Substance Use Topics   Alcohol use: No    Alcohol/week: 0.0 standard drinks    Drug use: No     Allergies   Ace inhibitors, Hydrocodone-acetaminophen, and Oxycodone   Review of Systems Review of Systems  Constitutional:  Negative for activity change, appetite change, fatigue and fever.  HENT:  Negative for congestion and sinus pressure.   Eyes: Negative.   Respiratory:  Positive for chest tightness. Negative for cough, shortness of breath and wheezing.   Cardiovascular:  Positive for palpitations. Negative for chest pain.  Gastrointestinal:  Negative for abdominal pain.  Genitourinary: Negative.   Musculoskeletal:  Positive for myalgias.    Physical Exam Triage Vital Signs ED Triage Vitals  Enc Vitals Group     BP 04/20/21 1029 (!) 146/91     Pulse Rate 04/20/21 1029 76     Resp 04/20/21 1029 16     Temp 04/20/21 1029 98.1 F (36.7 C)     Temp Source 04/20/21 1029 Oral     SpO2 04/20/21 1029 97 %     Weight --      Height --      Head Circumference --      Peak Flow --      Pain Score 04/20/21 1027 8     Pain Loc --      Pain Edu? --      Excl. in [place]? --    No data found.  Updated Vital Signs BP (!) 146/91 (BP Location: Right Arm)    Pulse 76    Temp 98.1 F (36.7 C) (Oral)    Resp 16    LMP  (LMP Unknown)    SpO2 97%   Visual Acuity Right Eye Distance:   Left Eye Distance:   Bilateral Distance:    Right Eye Near:   Left Eye Near:    Bilateral Near:     Physical Exam Constitutional:      Appearance: Normal appearance. She is normal weight.  HENT:     Head: Normocephalic and atraumatic.  Cardiovascular:     Rate and Rhythm: Normal rate and regular rhythm.  Pulmonary:     Effort: Pulmonary effort is normal.     Breath sounds: Normal breath sounds.  Abdominal:     General: Abdomen is flat.     Palpations: Abdomen is soft.     Tenderness: There is no abdominal tenderness.     Comments: Burps multiple times throughout the exam  Musculoskeletal:        General: Tenderness  present.     Cervical back: Normal range of motion and  neck supple.     Comments: TTP at the right upper back/trapezius muscle;   Neurological:     Mental Status: She is alert.     UC Treatments / Results  Labs (all labs ordered are listed, but only abnormal results are displayed) Labs Reviewed - No data to display  EKG   Radiology DG Chest 2 View  Result Date: 04/18/2021 CLINICAL DATA:  Onset of palpitations 30 minutes ago, chest tightness, history hypertension, tested positive for COVID-19 17 days ago, persistent cough since EXAM: CHEST - 2 VIEW COMPARISON:  06/03/2019 FINDINGS: Normal heart size, mediastinal contours, and pulmonary vascularity. Lungs clear. No pulmonary infiltrate, pleural effusion, or pneumothorax. Osseous structures unremarkable. IMPRESSION: No acute abnormalities. Electronically Signed   By: Lavonia Dana M.D.   On: 04/18/2021 16:46    Procedures Procedures (including critical care time)  Medications Ordered in UC Medications - No data to display  Initial Impression / Assessment and Plan / UC Course  I have reviewed the triage vital signs and the nursing notes.  Pertinent labs & imaging results that were available during my care of the patient were reviewed by me and considered in my medical decision making (see chart for details).   Patient seen today for palpitations.  Given recent ER visit and normal EKG, she is safe to go home.  I do recommend treatment for gerd.  Discussed this, as well as anxiety/stress as causative factors.  She will f/u with her pcp on Monday for further work up,.   Final Clinical Impressions(s) / UC Diagnoses   Final diagnoses:  Palpitations  Acute pain of right shoulder  Burping     Discharge Instructions      You were seen today for palpitations, back pain, and burping.  Your EKG looks good today, no sign of heart wall damage.  Since your work up in the ER 2 days ago was normal, I do not think you need to go back.  I have recommend an oral medication daily x 2 weeks to see if  helps with burping, as this may be causing some of your symptoms.  You  may also get over the counter bean-o or gas-x.   You have an appointment on Monday with your pcp.  Please see him/her for further care and work up if your symptoms continue.  I recommend tyleol/motrin taken with food for your back/neck pain.  Also recommend you use a heating pad.     ED Prescriptions     Medication Sig Dispense Auth. Provider   omeprazole (PRILOSEC) 20 MG capsule Take 1 capsule (20 mg total) by mouth daily for 14 days. 14 capsule Rondel Oh, MD      PDMP not reviewed this encounter.   Rondel Oh, MD 04/20/21 1109

## 2021-04-23 ENCOUNTER — Ambulatory Visit: Payer: BC Managed Care – PPO | Admitting: Family Medicine

## 2021-04-23 ENCOUNTER — Encounter: Payer: Self-pay | Admitting: Family Medicine

## 2021-04-23 VITALS — BP 124/82 | HR 87 | Temp 98.2°F | Wt 209.0 lb

## 2021-04-23 DIAGNOSIS — K219 Gastro-esophageal reflux disease without esophagitis: Secondary | ICD-10-CM

## 2021-04-23 DIAGNOSIS — F411 Generalized anxiety disorder: Secondary | ICD-10-CM | POA: Diagnosis not present

## 2021-04-23 DIAGNOSIS — R002 Palpitations: Secondary | ICD-10-CM

## 2021-04-23 MED ORDER — OMEPRAZOLE 40 MG PO CPDR: 40.0000 mg | DELAYED_RELEASE_CAPSULE | Freq: Every morning | ORAL | 5 refills | Status: DC

## 2021-04-23 MED ORDER — FAMOTIDINE 40 MG PO TABS: 40.0000 mg | ORAL_TABLET | Freq: Every day | ORAL | 5 refills | Status: DC

## 2021-04-23 NOTE — Progress Notes (Signed)
° °  Subjective:    Patient ID: Kiara Ramirez, female    DOB: 21-Dec-1950, 71 y.o.   MRN: [id number]  HPI Here to follow up an ED visit on 04-18-21 and an urgent care visit on 04-20-21 for palpitations. The first episode lasted 60 minute and the second lasted 30 minutes. She describes feeling her heart race but she denies any chest pain or SOB. EKG showed only sinus tachycardia. Labs and CXR were normal. The first time it was felt she was having anxiety and the second time it was felt she was having GERD issues. They started her on Omeprazole 20 mg every morning. This has not made much of a difference. Her anxiety has improved, and she has had no further spells of palpitations, but she still burps frequently. She denies heartburn or trouble swallowing.    Review of Systems  Constitutional: Negative.   Respiratory: Negative.    Cardiovascular:  Positive for palpitations. Negative for chest pain and leg swelling.  Gastrointestinal:  Negative for abdominal distention, abdominal pain, constipation, diarrhea, nausea and vomiting.  Psychiatric/Behavioral:  Negative for dysphoric mood. The patient is nervous/anxious.       Objective:   Physical Exam Constitutional:      Appearance: Normal appearance. She is not ill-appearing.  Cardiovascular:     Rate and Rhythm: Normal rate and regular rhythm.     Pulses: Normal pulses.     Heart sounds: Normal heart sounds.  Pulmonary:     Effort: Pulmonary effort is normal.     Breath sounds: Normal breath sounds.  Neurological:     Mental Status: She is alert.  Psychiatric:        Mood and Affect: Mood normal.        Behavior: Behavior normal.          Assessment & Plan:  She has had some spells of anxiety lately around the anniversary of her sister's passing away, but we agreed this did not require treatment. Her heart seems to be doing fine. It seems likely that the palpitations (and certainly the burping) are due to GERD. We will increase the morning  Omeprazole to 40 mg and we will add $0 mg of Famotidine every evening. Recheck in 2-3 weeks. We spent a total of ( 33  ) minutes reviewing records and discussing these issues.  Alysia Penna, MD

## 2021-07-30 ENCOUNTER — Ambulatory Visit: Payer: BC Managed Care – PPO | Admitting: Family Medicine

## 2021-07-30 ENCOUNTER — Encounter: Payer: Self-pay | Admitting: Family Medicine

## 2021-07-30 VITALS — BP 116/80 | HR 75 | Temp 98.0°F | Wt 209.0 lb

## 2021-07-30 DIAGNOSIS — M4802 Spinal stenosis, cervical region: Secondary | ICD-10-CM

## 2021-07-30 NOTE — Progress Notes (Signed)
? ?  Subjective:  ? ? Patient ID: Kiara Ramirez, female    DOB: [birth year], 71 y.o.   MRN: [id number] ? ?HPI ?Here for worsening tightness and pain in the neck with numbness and tingling in both hands and arms. She also has some numbness periodically in both feet. No weakness in the arms or hands. She had Xrays of the cervical spine on 05-15-20 showing degenerative discs and facet arthritis with osteophytes.  ? ? ?Review of Systems  ?Constitutional: Negative.   ?Respiratory: Negative.    ?Cardiovascular: Negative.   ?Musculoskeletal:  Positive for neck pain.  ?Neurological:  Positive for light-headedness and numbness. Negative for dizziness, tremors, seizures, syncope, facial asymmetry, speech difficulty, weakness and headaches.  ? ?   ?Objective:  ? Physical Exam ?Constitutional:   ?   General: She is not in acute distress. ?   Appearance: Normal appearance.  ?Cardiovascular:  ?   Rate and Rhythm: Normal rate and regular rhythm.  ?   Pulses: Normal pulses.  ?   Heart sounds: Normal heart sounds.  ?Pulmonary:  ?   Effort: Pulmonary effort is normal.  ?   Breath sounds: Normal breath sounds.  ?Musculoskeletal:  ?   Comments: She is tender in the posterior neck, but ROM is full   ?Neurological:  ?   General: No focal deficit present.  ?   Mental Status: She is alert and oriented to person, place, and time.  ? ? ? ? ? ?   ?Assessment & Plan:  ?Neck pain with arm numbness, consistent with cervical stenosis. We will set up a cervical spine MRI asap.  ?Alysia Penna, MD ? ? ?

## 2021-07-31 ENCOUNTER — Encounter: Payer: BC Managed Care – PPO | Admitting: Family Medicine

## 2021-08-10 ENCOUNTER — Ambulatory Visit (INDEPENDENT_AMBULATORY_CARE_PROVIDER_SITE_OTHER): Payer: BC Managed Care – PPO | Admitting: Family Medicine

## 2021-08-10 ENCOUNTER — Encounter: Payer: Self-pay | Admitting: Family Medicine

## 2021-08-10 VITALS — BP 124/80 | HR 80 | Temp 98.5°F | Ht 62.0 in | Wt 213.0 lb

## 2021-08-10 DIAGNOSIS — M109 Gout, unspecified: Secondary | ICD-10-CM | POA: Diagnosis not present

## 2021-08-10 DIAGNOSIS — Z1211 Encounter for screening for malignant neoplasm of colon: Secondary | ICD-10-CM

## 2021-08-10 DIAGNOSIS — Z23 Encounter for immunization: Secondary | ICD-10-CM

## 2021-08-10 DIAGNOSIS — Z0000 Encounter for general adult medical examination without abnormal findings: Secondary | ICD-10-CM | POA: Diagnosis not present

## 2021-08-10 LAB — CBC WITH DIFFERENTIAL/PLATELET
Basophils Absolute: 0 10*3/uL (ref 0.0–0.1)
Basophils Relative: 0.5 % (ref 0.0–3.0)
Eosinophils Absolute: 0 10*3/uL (ref 0.0–0.7)
Eosinophils Relative: 0.8 % (ref 0.0–5.0)
HCT: 36.8 % (ref 36.0–46.0)
Hemoglobin: 12 g/dL (ref 12.0–15.0)
Lymphocytes Relative: 26.5 % (ref 12.0–46.0)
Lymphs Abs: 1.6 10*3/uL (ref 0.7–4.0)
MCHC: 32.7 g/dL (ref 30.0–36.0)
MCV: 88.3 fl (ref 78.0–100.0)
Monocytes Absolute: 0.3 10*3/uL (ref 0.1–1.0)
Monocytes Relative: 5.2 % (ref 3.0–12.0)
Neutro Abs: 4 10*3/uL (ref 1.4–7.7)
Neutrophils Relative %: 67 % (ref 43.0–77.0)
Platelets: 342 10*3/uL (ref 150.0–400.0)
RBC: 4.17 Mil/uL (ref 3.87–5.11)
RDW: 14.2 % (ref 11.5–15.5)
WBC: 6 10*3/uL (ref 4.0–10.5)

## 2021-08-10 LAB — BASIC METABOLIC PANEL
BUN: 17 mg/dL (ref 6–23)
CO2: 33 mEq/L — ABNORMAL HIGH (ref 19–32)
Calcium: 9 mg/dL (ref 8.4–10.5)
Chloride: 100 mEq/L (ref 96–112)
Creatinine, Ser: 0.93 mg/dL (ref 0.40–1.20)
GFR: 62.61 mL/min (ref >60.00)
Glucose, Bld: 91 mg/dL (ref 70–99)
Potassium: 3.6 mEq/L (ref 3.5–5.1)
Sodium: 140 mEq/L (ref 135–145)

## 2021-08-10 LAB — LIPID PANEL
Cholesterol: 122 mg/dL (ref 0–200)
HDL: 39.7 mg/dL (ref >39.00)
LDL Cholesterol: 71 mg/dL (ref 0–99)
NonHDL: 82.09
Total CHOL/HDL Ratio: 3
Triglycerides: 54 mg/dL (ref 0.0–149.0)
VLDL: 10.8 mg/dL (ref 0.0–40.0)

## 2021-08-10 LAB — HEMOGLOBIN A1C: Hgb A1c MFr Bld: 5.8 % (ref 4.6–6.5)

## 2021-08-10 LAB — HEPATIC FUNCTION PANEL
ALT: 7 U/L (ref 0–35)
AST: 13 U/L (ref 0–37)
Albumin: 3.5 g/dL (ref 3.5–5.2)
Alkaline Phosphatase: 85 U/L (ref 39–117)
Bilirubin, Direct: 0.1 mg/dL (ref 0.0–0.3)
Total Bilirubin: 0.3 mg/dL (ref 0.2–1.2)
Total Protein: 7.8 g/dL (ref 6.0–8.3)

## 2021-08-10 LAB — URIC ACID: Uric Acid, Serum: 8.2 mg/dL — ABNORMAL HIGH (ref 2.4–7.0)

## 2021-08-10 LAB — TSH: TSH: 1.03 u[IU]/mL (ref 0.35–5.50)

## 2021-08-10 MED ORDER — HYDROCHLOROTHIAZIDE 25 MG PO TABS: 25.0000 mg | ORAL_TABLET | Freq: Every day | ORAL | 3 refills | Status: DC

## 2021-08-10 MED ORDER — POTASSIUM CHLORIDE ER 10 MEQ PO TBCR: 20.0000 meq | EXTENDED_RELEASE_TABLET | Freq: Two times a day (BID) | ORAL | 3 refills | Status: DC

## 2021-08-10 MED ORDER — ALLOPURINOL 100 MG PO TABS: 100.0000 mg | ORAL_TABLET | Freq: Every day | ORAL | 3 refills | Status: DC

## 2021-08-10 MED ORDER — MELOXICAM 15 MG PO TABS: 15.0000 mg | ORAL_TABLET | Freq: Every day | ORAL | 3 refills | Status: DC

## 2021-08-10 NOTE — Progress Notes (Signed)
? ?  Subjective:  ? ? Patient ID: Kiara Ramirez, female    DOB: 1950/08/18, 71 y.o.   MRN: [id number] ? ?HPI ?Here for a well exam. She feels fine except for her joint pains. We have ordered an MRI of her cervical spine, but this is still pending. She asks if she can try Meloxicam instead of Naproxen. She has not had her colon evaluated since 2017 because colonoscopy was technically difficult.  ? ? ?Review of Systems  ?Constitutional: Negative.   ?HENT: Negative.    ?Eyes: Negative.   ?Respiratory: Negative.    ?Cardiovascular: Negative.   ?Gastrointestinal: Negative.   ?Genitourinary:  Negative for decreased urine volume, difficulty urinating, dyspareunia, dysuria, enuresis, flank pain, frequency, hematuria, pelvic pain and urgency.  ?Musculoskeletal:  Positive for arthralgias and neck pain.  ?Skin: Negative.   ?Neurological: Negative.  Negative for headaches.  ?Psychiatric/Behavioral: Negative.    ? ?   ?Objective:  ? Physical Exam ?Constitutional:   ?   General: She is not in acute distress. ?   Appearance: She is well-developed. She is obese.  ?HENT:  ?   Head: Normocephalic and atraumatic.  ?   Right Ear: External ear normal.  ?   Left Ear: External ear normal.  ?   Nose: Nose normal.  ?   Mouth/Throat:  ?   Pharynx: No oropharyngeal exudate.  ?Eyes:  ?   General: No scleral icterus. ?   Conjunctiva/sclera: Conjunctivae normal.  ?   Pupils: Pupils are equal, round, and reactive to light.  ?Neck:  ?   Thyroid: No thyromegaly.  ?   Vascular: No JVD.  ?Cardiovascular:  ?   Rate and Rhythm: Normal rate and regular rhythm.  ?   Heart sounds: Normal heart sounds. No murmur heard. ?  No friction rub. No gallop.  ?Pulmonary:  ?   Effort: Pulmonary effort is normal. No respiratory distress.  ?   Breath sounds: Normal breath sounds. No wheezing or rales.  ?Chest:  ?   Chest wall: No tenderness.  ?Abdominal:  ?   General: Bowel sounds are normal. There is no distension.  ?   Palpations: Abdomen is soft. There is no mass.  ?    Tenderness: There is no abdominal tenderness. There is no guarding or rebound.  ?Musculoskeletal:     ?   General: No tenderness. Normal range of motion.  ?   Cervical back: Normal range of motion and neck supple.  ?Lymphadenopathy:  ?   Cervical: No cervical adenopathy.  ?Skin: ?   General: Skin is warm and dry.  ?   Findings: No erythema or rash.  ?Neurological:  ?   Mental Status: She is alert and oriented to person, place, and time.  ?   Cranial Nerves: No cranial nerve deficit.  ?   Motor: No abnormal muscle tone.  ?   Coordination: Coordination normal.  ?   Deep Tendon Reflexes: Reflexes are normal and symmetric. Reflexes normal.  ?Psychiatric:     ?   Behavior: Behavior normal.     ?   Thought Content: Thought content normal.     ?   Judgment: Judgment normal.  ? ? ? ? ? ?   ?Assessment & Plan:  ?Well exam. We discussed diet and exercise. Get fasting labs. We will order a Cologuard test. She will try Meloxicam 15 mg daily.  ?Alysia Penna, MD ? ? ?

## 2021-08-18 ENCOUNTER — Other Ambulatory Visit: Payer: BC Managed Care – PPO

## 2021-08-21 ENCOUNTER — Ambulatory Visit
Admission: RE | Admit: 2021-08-21 | Discharge: 2021-08-21 | Disposition: A | Discharge status: Home / Self Care | Payer: BC Managed Care – PPO | Source: Ambulatory Visit | Attending: Family Medicine | Admitting: Family Medicine

## 2021-08-21 DIAGNOSIS — M4802 Spinal stenosis, cervical region: Secondary | ICD-10-CM

## 2021-09-24 ENCOUNTER — Ambulatory Visit (INDEPENDENT_AMBULATORY_CARE_PROVIDER_SITE_OTHER): Payer: PPO | Admitting: Family Medicine

## 2021-09-24 ENCOUNTER — Encounter: Payer: Self-pay | Admitting: Family Medicine

## 2021-09-24 VITALS — BP 120/82 | HR 86 | Temp 97.7°F | Ht 62.0 in | Wt 214.1 lb

## 2021-09-24 DIAGNOSIS — G44201 Tension-type headache, unspecified, intractable: Secondary | ICD-10-CM

## 2021-09-24 NOTE — Progress Notes (Signed)
Subjective:     Patient ID: Kiara Ramirez, female    DOB: June 20, 1950, 71 y.o.   MRN: [id number]  Chief Complaint  Patient presents with   Headache    Nagging headache since Friday, Saturday slight headache, yesterday full blown with nausea Has tried extra strength tylenol with no relief     HPI Migraine-triggers onions/chocolate.  Last HA yrs ago. 09/21/21-nagging HA and by 6/11-intensified and head "full".  Frontal back.  Yesterday, a little loose stool and "woozy".  This is not as severe as prev migraine.  Some nausea yesterday.  Today, feels much better.  Took 1 tylenol yesterday and went to sleep and then better and returned.  Some groggy this am.  No dbl vision.  No f/c.  No URI.  No confusion    no weakness.  Sis similar 1 wk ago.  But fever as well.   Concerned as friend has tumor  Taking allopurinol prn.  Uric acid 8.2.    Health Maintenance Due  Topic Date Due   Hepatitis C Screening  Never done   Zoster Vaccines- Shingrix (1 of 2) Never done   DEXA SCAN  Never done    Past Medical History:  Diagnosis Date   Allergy    Anemia    Anxiety    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 meds   B12 deficiency    GERD (gastroesophageal reflux disease)    Gout    Hypertension     Past Surgical History:  Procedure Laterality Date   COLONOSCOPY  12/16/2018   per Dr. Fuller Plan, incomplete exam     CYSTOSCOPY  04/30/2018   Procedure: CYSTOSCOPY;  Surgeon: Everett Graff, MD;  Location: [facility];  Service: Gynecology;;   DG BARIUM ENEMA ([facility])  01/18/2019   per Dr. Fuller Plan, incomplete exam, get CT colonography in 5 years    HYSTERECTOMY ABDOMINAL WITH SALPINGO-OOPHORECTOMY Bilateral 04/30/2018   Procedure: TOTAL ABDOMINAL HYSTERECTOMY WITH BILATERAL SALPINGO-OOPHORECTOMY;  Surgeon: Everett Graff, MD;  Location: [facility];  Service: Gynecology;  Laterality: Bilateral;   LAPAROSCOPY  04/30/2018   Procedure: LAPAROSCOPY DIAGNOSTIC;  Surgeon: Everett Graff, MD;  Location: [facility];  Service:  Gynecology;;   TUBAL LIGATION      Outpatient Medications Prior to Visit  Medication Sig Dispense Refill   allopurinol (ZYLOPRIM) 100 MG tablet Take 1 tablet (100 mg total) by mouth daily. 90 tablet 3   aspirin 81 MG tablet Take 81 mg by mouth daily.     clobetasol ointment (TEMOVATE) 0.05 % clobetasol 0.05 % topical ointment  APPLY A THIN LAYER TO THE AFFECTED AREA TWICE DAILY FOR 2 WEEKS     famotidine (PEPCID) 40 MG tablet Take 1 tablet (40 mg total) by mouth at bedtime. 30 tablet 5   hydrochlorothiazide (HYDRODIURIL) 25 MG tablet Take 1 tablet (25 mg total) by mouth daily. 90 tablet 3   lidocaine (XYLOCAINE) 2 % solution Use as directed 15 mLs in the mouth or throat as needed for mouth pain. 100 mL 0   meloxicam (MOBIC) 15 MG tablet Take 1 tablet (15 mg total) by mouth daily. 90 tablet 3   omeprazole (PRILOSEC) 40 MG capsule Take 1 capsule (40 mg total) by mouth in the morning. 30 capsule 5   potassium chloride (KLOR-CON) 10 MEQ tablet Take 2 tablets (20 mEq total) by mouth 2 (two) times daily. 180 tablet 3   No facility-administered medications prior to visit.    Allergies  Allergen Reactions  Ace Inhibitors Swelling   Hydrocodone    Hydrocodone-Acetaminophen Nausea Only   Oxycodone Nausea And Vomiting    Headache   ROS neg/noncontributory except as noted HPI/below      Objective:     BP 120/82   Pulse 86   Temp 97.7 F (36.5 C) (Temporal)   Ht '5\' 2"'$  (1.575 m)   Wt 214 lb 2 oz (97.1 kg)   LMP  (LMP Unknown)   SpO2 97%   BMI 39.16 kg/m  Wt Readings from Last 3 Encounters:  09/24/21 214 lb 2 oz (97.1 kg)  08/10/21 213 lb (96.6 kg)  07/30/21 209 lb (94.8 kg)    Physical Exam   Gen: WDWN NAD oaaf HEENT: NCAT, conjunctiva not injected, sclera nonicteric TM WNL B, OP moist, no exudates  NECK:  supple, no thyromegaly, no nodes, no carotid bruits CARDIAC: RRR, S1S2+, no murmur.  LUNGS: CTAB. No wheezes EXT:  no edema MSK: no gross abnormalities.  NEURO: A&O  x3.  CN II-XII intact. F-N, RAM,pronator drift neg. PSYCH: normal mood. Good eye contact     Assessment & Plan:   Problem List Items Addressed This Visit       Other   Headache - Primary  1.  Headache-patient has history of migraine, but none for several years.  This may be recurrence of migraine.  This is day 3 and it seems to be resolving.  Also may be the beginning of a viral illness (her sister had similar symptoms but had fever and got sick)-patient will call if other symptoms.  May also be from cervical spinal stenosis.  No specific red flags.  Exam was benign.  Patient was concerned as a friend who has a brain tumor.  Reassurance was given to the patient.  Still, continue to monitor symptoms.  If headaches return, new symptoms appear, follow-up with Dr. Sarajane Jews (may need imaging) continue Tylenol if needed.  Patient agrees with plan.  No orders of the defined types were placed in this encounter.   Wellington Hampshire, MD

## 2021-09-25 ENCOUNTER — Encounter: Payer: Self-pay | Admitting: Family Medicine

## 2021-10-10 ENCOUNTER — Other Ambulatory Visit: Payer: Self-pay | Admitting: Family Medicine

## 2021-11-27 DIAGNOSIS — M1990 Unspecified osteoarthritis, unspecified site: Secondary | ICD-10-CM | POA: Diagnosis not present

## 2021-11-27 DIAGNOSIS — E876 Hypokalemia: Secondary | ICD-10-CM | POA: Diagnosis not present

## 2021-11-27 DIAGNOSIS — G8929 Other chronic pain: Secondary | ICD-10-CM | POA: Diagnosis not present

## 2021-11-27 DIAGNOSIS — M109 Gout, unspecified: Secondary | ICD-10-CM | POA: Diagnosis not present

## 2021-11-27 DIAGNOSIS — Z7982 Long term (current) use of aspirin: Secondary | ICD-10-CM | POA: Diagnosis not present

## 2021-11-27 DIAGNOSIS — I10 Essential (primary) hypertension: Secondary | ICD-10-CM | POA: Diagnosis not present

## 2022-01-08 ENCOUNTER — Telehealth: Payer: Self-pay | Admitting: Family Medicine

## 2022-01-08 ENCOUNTER — Ambulatory Visit (INDEPENDENT_AMBULATORY_CARE_PROVIDER_SITE_OTHER): Payer: PPO

## 2022-01-08 ENCOUNTER — Other Ambulatory Visit: Payer: Self-pay

## 2022-01-08 DIAGNOSIS — Z23 Encounter for immunization: Secondary | ICD-10-CM

## 2022-01-08 DIAGNOSIS — Z1211 Encounter for screening for malignant neoplasm of colon: Secondary | ICD-10-CM

## 2022-01-08 NOTE — Telephone Encounter (Signed)
Pt was seen on 08/10/2021 and she is still waiting to receive cologuard

## 2022-01-15 NOTE — Telephone Encounter (Signed)
Spoke with [facility] advised that pt cologuard kit was mailed out to pt address.Spoke with pt state that she just received the kit and she will follow instructions and send the kit back to [facility] 

## 2022-02-05 DIAGNOSIS — Z1211 Encounter for screening for malignant neoplasm of colon: Secondary | ICD-10-CM | POA: Diagnosis not present

## 2022-02-05 HISTORY — PX: COLONOSCOPY: SHX174

## 2022-02-06 ENCOUNTER — Encounter: Payer: Self-pay | Admitting: Family Medicine

## 2022-02-06 ENCOUNTER — Ambulatory Visit (INDEPENDENT_AMBULATORY_CARE_PROVIDER_SITE_OTHER): Payer: PPO | Admitting: Family Medicine

## 2022-02-06 VITALS — BP 136/84 | HR 78 | Temp 98.0°F | Wt 218.0 lb

## 2022-02-06 DIAGNOSIS — M542 Cervicalgia: Secondary | ICD-10-CM

## 2022-02-06 MED ORDER — CYCLOBENZAPRINE HCL 10 MG PO TABS: 10.0000 mg | ORAL_TABLET | Freq: Three times a day (TID) | ORAL | 0 refills | Status: DC | PRN

## 2022-02-06 MED ORDER — METHYLPREDNISOLONE 4 MG PO TBPK: ORAL_TABLET | ORAL | 0 refills | Status: DC

## 2022-02-06 NOTE — Progress Notes (Signed)
   Subjective:    Patient ID: Kiara Ramirez, female    DOB: [birth year], 71 y.o.   MRN: [id number]  HPI Here for 3 days of tightness and a sharp pain in the left side of the neck and the left upper back. No recent trauma. She takes Meloxicam daily and she added some Tylenol to that with little relief. Heat helps.    Review of Systems  Constitutional: Negative.   Respiratory: Negative.    Cardiovascular: Negative.   Musculoskeletal:  Positive for back pain.       Objective:   Physical Exam Constitutional:      General: She is not in acute distress.    Appearance: Normal appearance.  Cardiovascular:     Rate and Rhythm: Normal rate and regular rhythm.     Pulses: Normal pulses.     Heart sounds: Normal heart sounds.  Pulmonary:     Effort: Pulmonary effort is normal.     Breath sounds: Normal breath sounds.  Musculoskeletal:     Comments: She is quite tender in the left upper back just superior to the scapula. Not tender over the spine itself. The neck has full ROM   Neurological:     Mental Status: She is alert.           Assessment & Plan:  She is having muscle spasms from a pinched nerve in the neck. Treat wit Flexeril and a Medrol dose pack. Recheck as needed.  Alysia Penna, MD

## 2022-02-11 LAB — COLOGUARD: COLOGUARD: NEGATIVE

## 2022-05-07 DIAGNOSIS — H524 Presbyopia: Secondary | ICD-10-CM | POA: Diagnosis not present

## 2022-07-02 ENCOUNTER — Encounter (HOSPITAL_COMMUNITY): Payer: Self-pay

## 2022-07-02 ENCOUNTER — Emergency Department (HOSPITAL_COMMUNITY): Payer: PPO

## 2022-07-02 ENCOUNTER — Emergency Department (HOSPITAL_COMMUNITY)
Admission: EM | Admit: 2022-07-02 | Discharge: 2022-07-02 | Disposition: A | Discharge status: Home / Self Care | Payer: PPO | Attending: Emergency Medicine | Admitting: Emergency Medicine

## 2022-07-02 ENCOUNTER — Ambulatory Visit (HOSPITAL_COMMUNITY)
Admission: EM | Admit: 2022-07-02 | Discharge: 2022-07-02 | Disposition: A | Discharge status: Home / Self Care | Payer: PPO | Attending: Family Medicine | Admitting: Family Medicine

## 2022-07-02 ENCOUNTER — Other Ambulatory Visit: Payer: Self-pay

## 2022-07-02 DIAGNOSIS — E876 Hypokalemia: Secondary | ICD-10-CM | POA: Diagnosis not present

## 2022-07-02 DIAGNOSIS — R519 Headache, unspecified: Secondary | ICD-10-CM

## 2022-07-02 DIAGNOSIS — R200 Anesthesia of skin: Secondary | ICD-10-CM | POA: Diagnosis not present

## 2022-07-02 DIAGNOSIS — R202 Paresthesia of skin: Secondary | ICD-10-CM | POA: Insufficient documentation

## 2022-07-02 DIAGNOSIS — I10 Essential (primary) hypertension: Secondary | ICD-10-CM

## 2022-07-02 DIAGNOSIS — I444 Left anterior fascicular block: Secondary | ICD-10-CM | POA: Diagnosis not present

## 2022-07-02 DIAGNOSIS — D72829 Elevated white blood cell count, unspecified: Secondary | ICD-10-CM | POA: Diagnosis not present

## 2022-07-02 DIAGNOSIS — Z7982 Long term (current) use of aspirin: Secondary | ICD-10-CM | POA: Diagnosis not present

## 2022-07-02 LAB — CBC
HCT: 38.9 % (ref 36.0–46.0)
Hemoglobin: 12.6 g/dL (ref 12.0–15.0)
MCH: 28.9 pg (ref 26.0–34.0)
MCHC: 32.4 g/dL (ref 30.0–36.0)
MCV: 89.2 fL (ref 80.0–100.0)
Platelets: 416 10*3/uL — ABNORMAL HIGH (ref 150–400)
RBC: 4.36 MIL/uL (ref 3.87–5.11)
RDW: 14.5 % (ref 11.5–15.5)
WBC: 12.4 10*3/uL — ABNORMAL HIGH (ref 4.0–10.5)
nRBC: 0 % (ref 0.0–0.2)

## 2022-07-02 LAB — BASIC METABOLIC PANEL
Anion gap: 12 (ref 5–15)
BUN: 9 mg/dL (ref 8–23)
CO2: 27 mmol/L (ref 22–32)
Calcium: 8.9 mg/dL (ref 8.9–10.3)
Chloride: 101 mmol/L (ref 98–111)
Creatinine, Ser: 0.91 mg/dL (ref 0.44–1.00)
GFR, Estimated: >60 mL/min (ref >60)
Glucose, Bld: 90 mg/dL (ref 70–99)
Potassium: 3.3 mmol/L — ABNORMAL LOW (ref 3.5–5.1)
Sodium: 140 mmol/L (ref 135–145)

## 2022-07-02 LAB — TROPONIN I (HIGH SENSITIVITY)
Troponin I (High Sensitivity): 6 ng/L (ref <18)
Troponin I (High Sensitivity): 6 ng/L (ref <18)

## 2022-07-02 MED ORDER — LORAZEPAM 2 MG/ML IJ SOLN
1.0000 mg | Freq: Once | INTRAMUSCULAR | Status: AC
  Administered 2022-07-02: 1 mg via INTRAVENOUS
  Filled 2022-07-02: qty 1

## 2022-07-02 NOTE — ED Provider Notes (Signed)
[facility] Provider Note   CSN: [id number] Arrival date & time: 07/02/22  1736     History  Chief Complaint  Patient presents with   stroke like symptoms    Kiara Ramirez is a 72 y.o. female.  Patient with history of hypertension presents today for evaluation of left hand and left foot tingling and "numb" sensation starting this morning around 8 AM.  She also had a mild generalized headache that started around that time.  No thunderclap headache.  No associated vomiting or confusion.  No falls or injuries.  Headache has gradually been improving.  No associated fevers or other infectious symptoms.  She denies history of stroke.  No high cholesterol or diabetes.  She states that sometimes she will get tingling in her right arm however the symptoms are typically short-lived but symptoms today were persistent.  This caused her to go to urgent care, where she was referred to the emergency department for further evaluation. There was concern for TIA/CVA.  Patient denies other signs of stroke including: facial droop, slurred speech, aphasia, weakness/numbness in extremities, imbalance/trouble walking.  She states that she has been told she has a "nerve irritation" that has caused some of her symptoms in the past.  No current neck pain.  No lower back pain.        Home Medications Prior to Admission medications   Medication Sig Start Date End Date Taking? Authorizing Provider  allopurinol (ZYLOPRIM) 100 MG tablet Take 1 tablet (100 mg total) by mouth daily. 08/10/21   Laurey Morale, MD  aspirin 81 MG tablet Take 81 mg by mouth daily.    [provider]  clobetasol ointment (TEMOVATE) 0.05 % clobetasol 0.05 % topical ointment  APPLY A THIN LAYER TO THE AFFECTED AREA TWICE DAILY FOR 2 WEEKS    [provider]  cyclobenzaprine (FLEXERIL) 10 MG tablet Take 1 tablet (10 mg total) by mouth 3 (three) times daily as needed for muscle  spasms. 02/06/22   Laurey Morale, MD  famotidine (PEPCID) 40 MG tablet Take 1 tablet (40 mg total) by mouth at bedtime. 04/23/21   Laurey Morale, MD  hydrochlorothiazide (HYDRODIURIL) 25 MG tablet Take 1 tablet (25 mg total) by mouth daily. 08/10/21   Laurey Morale, MD  lidocaine (XYLOCAINE) 2 % solution Use as directed 15 mLs in the mouth or throat as needed for mouth pain. 03/30/21   Hans Eden, NP  meloxicam (MOBIC) 15 MG tablet Take 1 tablet (15 mg total) by mouth daily. 08/10/21   Laurey Morale, MD  omeprazole (PRILOSEC) 40 MG capsule Take 1 capsule (40 mg total) by mouth in the morning. 04/23/21   Laurey Morale, MD  potassium chloride (KLOR-CON) 10 MEQ tablet Take 2 tablets (20 mEq total) by mouth 2 (two) times daily. 08/10/21   Laurey Morale, MD      Allergies    Ace inhibitors, Hydrocodone, Hydrocodone-acetaminophen, and Oxycodone    Review of Systems   Review of Systems  Physical Exam Updated Vital Signs BP (!) 157/78   Pulse 88   Temp 98.3 F (36.8 C) (Oral)   Resp 13   Ht 5\' 2"  (1.575 m)   Wt 98.9 kg   LMP  (LMP Unknown)   SpO2 100%   BMI 39.88 kg/m   Physical Exam Vitals and nursing note reviewed.  Constitutional:      Appearance: She is well-developed.  HENT:  Head: Normocephalic and atraumatic.     Right Ear: Tympanic membrane, ear canal and external ear normal.     Left Ear: Tympanic membrane, ear canal and external ear normal.     Nose: Nose normal.     Mouth/Throat:     Pharynx: Uvula midline.  Eyes:     General: Lids are normal.     Extraocular Movements:     Right eye: No nystagmus.     Left eye: No nystagmus.     Conjunctiva/sclera: Conjunctivae normal.     Pupils: Pupils are equal, round, and reactive to light.  Cardiovascular:     Rate and Rhythm: Normal rate and regular rhythm.  Pulmonary:     Effort: Pulmonary effort is normal.     Breath sounds: Normal breath sounds.  Abdominal:     Palpations: Abdomen is soft.     Tenderness:  There is no abdominal tenderness.  Musculoskeletal:     Cervical back: Normal range of motion and neck supple. No tenderness or bony tenderness.  Skin:    General: Skin is warm and dry.  Neurological:     Mental Status: She is alert and oriented to person, place, and time.     GCS: GCS eye subscore is 4. GCS verbal subscore is 5. GCS motor subscore is 6.     Cranial Nerves: No cranial nerve deficit.     Sensory: Sensory deficit present.     Motor: No weakness.     Coordination: Coordination normal.     Gait: Gait normal.     Comments: She reports slightly decreased sensation in fingers of left hand to light touch. No discreet dermatomal distribution. No weakness.   Upper extremity myotomes tested bilaterally:  C5 Shoulder abduction 5/5 C6 Elbow flexion/wrist extension 5/5 C7 Elbow extension 5/5 C8 Finger flexion 5/5 T1 Finger abduction 5/5  Lower extremity myotomes tested bilaterally: L2 Hip flexion 5/5 L3 Knee extension 5/5 L4 Ankle dorsiflexion 5/5 S1 Ankle plantar flexion 5/5      ED Results / Procedures / Treatments   Labs (all labs ordered are listed, but only abnormal results are displayed) Labs Reviewed  BASIC METABOLIC PANEL - Abnormal; Notable for the following components:      Result Value   Potassium 3.3 (*)    All other components within normal limits  CBC - Abnormal; Notable for the following components:   WBC 12.4 (*)    Platelets 416 (*)    All other components within normal limits  TROPONIN I (HIGH SENSITIVITY)  TROPONIN I (HIGH SENSITIVITY)    ED ECG REPORT   Date: 07/02/2022  Rate: 98  Rhythm: normal sinus rhythm  QRS Axis: left  Intervals: normal  ST/T Wave abnormalities: normal  Conduction Disutrbances:left anterior fascicular block  Narrative Interpretation:   Old EKG Reviewed: unchanged  I have personally reviewed the EKG tracing and agree with the computerized printout as noted.   Radiology MR BRAIN WO CONTRAST  Result Date:  07/02/2022 CLINICAL DATA:  Initial evaluation for numbness, tingling, paresthesias. EXAM: MRI HEAD WITHOUT CONTRAST TECHNIQUE: Multiplanar, multiecho pulse sequences of the brain and surrounding structures were obtained without intravenous contrast. COMPARISON:  Prior CT from earlier the same day. FINDINGS: Brain: Cerebral volume within normal limits. Mild patchy T2/FLAIR hyperintensity involving the periventricular white matter, most likely related to chronic microvascular ischemic disease, mild for age. No evidence for acute or subacute ischemia. Gray-white matter differentiation maintained. No areas of chronic cortical infarction. No acute or chronic  intracranial blood products. No mass lesion, midline shift or mass effect. No hydrocephalus or extra-axial fluid collection. Pituitary gland and suprasellar region within normal limits. Vascular: Major intracranial vascular flow voids are maintained. Skull and upper cervical spine: Craniocervical junction within normal limits. Diffuse loss of normal bone marrow signal, nonspecific but can be seen with anemia, smoking, obesity, and infiltrative/myelofibrotic marrow processes. No focal marrow replacing lesion. No scalp soft tissue abnormality. Sinuses/Orbits: Globes orbital soft tissues within normal limits. Paranasal sinuses are largely clear. No mastoid effusion. Other: None. IMPRESSION: 1. No acute intracranial abnormality. 2. Mild chronic microvascular ischemic disease for age. Electronically Signed   By: Jeannine Boga M.D.   On: 07/02/2022 21:47   DG Chest 1 View  Result Date: 07/02/2022 CLINICAL DATA:  Per ED provider triage notes: "Pt complains of left arm numbness that began at 830 this morning. Numbness has been continuous and patient states that she normally has numbness in her left arm however today is worse than normal. EXAM: CHEST  1 VIEW COMPARISON:  None Available. FINDINGS: Lungs are clear. Heart size remains normal. No effusion. Vertebral  endplate spurring at multiple levels in the mid thoracic spine. IMPRESSION: No acute cardiopulmonary disease. Electronically Signed   By: Lucrezia Europe M.D.   On: 07/02/2022 18:44   CT Head Wo Contrast  Result Date: 07/02/2022 CLINICAL DATA:  Left arm and left leg numbness and tingling. EXAM: CT HEAD WITHOUT CONTRAST TECHNIQUE: Contiguous axial images were obtained from the base of the skull through the vertex without intravenous contrast. RADIATION DOSE REDUCTION: This exam was performed according to the departmental dose-optimization program which includes automated exposure control, adjustment of the mA and/or kV according to patient size and/or use of iterative reconstruction technique. COMPARISON:  April 28, 2015 FINDINGS: Brain: There is mild cerebral atrophy with widening of the extra-axial spaces and ventricular dilatation. There are areas of decreased attenuation within the white matter tracts of the supratentorial brain, consistent with microvascular disease changes. Vascular: No hyperdense vessel or unexpected calcification. Skull: Normal. Negative for fracture or focal lesion. Sinuses/Orbits: No acute finding. Other: None. IMPRESSION: 1. No acute intracranial abnormality. 2. Generalized cerebral atrophy and microvascular disease changes of the supratentorial brain. Electronically Signed   By: Virgina Norfolk M.D.   On: 07/02/2022 18:41    Procedures Procedures    Medications Ordered in ED Medications  LORazepam (ATIVAN) injection 1 mg (1 mg Intravenous Given 07/02/22 2055)    ED Course/ Medical Decision Making/ A&P    Patient seen and examined. History obtained directly from patient. Work-up including labs, imaging, EKG ordered in triage, if performed, were reviewed.    Labs/EKG: CBC, BMP, troponin x 2 ordered in triage.   Imaging: Independently visualized and interpreted.  This included: CT head, agree negative.   Ordered MRI.   Medications/Fluids: Ordered: ativan to help with  MRI  Most recent vital signs reviewed and are as follows: BP (!) 157/78   Pulse 88   Temp 98.3 F (36.8 C) (Oral)   Resp 13   Ht 5\' 2"  (1.575 m)   Wt 98.9 kg   LMP  (LMP Unknown)   SpO2 100%   BMI 39.88 kg/m   Initial impression: paresthesias.   10:25 PM Reassessment performed. Patient appears sleepy from the Ativan, but stable.  She states that her paresthesias are now resolved.  Family now at bedside.  Labs personally reviewed and interpreted including: CBC with minimally elevated white blood cell count at 12.4, normal hemoglobin; BMP  with slightly low potassium at 3.3 otherwise unremarkable; troponin 6 >> 6.   Imaging personally visualized: MRI brain, agree no stroke.  Reviewed pertinent lab work and imaging with patient at bedside. Questions answered.   Most current vital signs reviewed and are as follows: BP (!) 140/74 (BP Location: Right Arm)   Pulse 81   Temp 97.9 F (36.6 C) (Oral)   Resp 15   Ht 5\' 2"  (1.575 m)   Wt 98.9 kg   LMP  (LMP Unknown)   SpO2 99%   BMI 39.88 kg/m   Plan: Discharge to home.   Prescriptions written for: None  Other home care instructions discussed: Rest  ED return instructions discussed: Patient and family counseled to return if they have weakness in their arms or legs, slurred speech, trouble walking or talking, confusion, trouble with their balance, or if they have any other concerns. Patient verbalizes understanding and agrees with plan.   Follow-up instructions discussed: Patient encouraged to follow-up with their PCP in 3 days.                             Medical Decision Making Amount and/or Complexity of Data Reviewed Radiology: ordered.  Risk Prescription drug management.   Patient with left upper and lower extremity paresthesias without weakness.  The remainder of her neurologic exam is unremarkable.  She was evaluated with head CT without signs of acute stroke, mass or bleeding.  No signs of acute stroke on MRI.   Remainder of lab workup reassuring.  Mildly elevated white blood cell count, however no focal source of infection.  Do not suspect meningitis or encephalitis.  No fevers.  Patient is at her baseline.  Feel that she could be closely monitored at home with close outpatient follow-up.  Family is in agreement with this and will help monitor symptoms as well.  Considered cervical radiculopathy on differential, as well as central cord syndrome, however patient without weakness or other significant neurologic deficits.  The patient's vital signs, pertinent lab work and imaging were reviewed and interpreted as discussed in the ED course. Hospitalization was considered for further testing, treatments, or serial exams/observation. However as patient is well-appearing, has a stable exam, and reassuring studies today, I do not feel that they warrant admission at this time. This plan was discussed with the patient who verbalizes agreement and comfort with this plan and seems reliable and able to return to the Emergency Department with worsening or changing symptoms.          Final Clinical Impression(s) / ED Diagnoses Final diagnoses:  Paresthesias  Acute nonintractable headache, unspecified headache type    Rx / DC Orders ED Discharge Orders     None         Carlisle Cater, PA-C 07/02/22 2234    Audley Hose, MD 07/18/22 [phone]

## 2022-07-02 NOTE — ED Notes (Signed)
Patient verbalizes understanding of discharge instructions. Opportunity for questioning and answers were provided. Armband removed by staff, pt discharged from ED. Pt taken to ED waiting room via wheel chair.  

## 2022-07-02 NOTE — Discharge Instructions (Addendum)
Please read and follow all provided instructions.  Your diagnoses today include:  1. Paresthesias     Tests performed today include: Blood cell counts and electrolytes: Mildly low potassium otherwise no problems Blood test for stress on the heart: Were negative CT scan of the head did not show signs of stroke or other problems MRI of the brain did not show signs of stroke or other problems Vital signs. See below for your results today.   Medications prescribed:  None  Take any prescribed medications only as directed.  Home care instructions:  Follow any educational materials contained in this packet.  BE VERY CAREFUL not to take multiple medicines containing Tylenol (also called acetaminophen). Doing so can lead to an overdose which can damage your liver and cause liver failure and possibly death.   Follow-up instructions: Please follow-up with your primary care provider in the next 3 days for further evaluation of your symptoms.   Return instructions:  Please return to the Emergency Department if you experience worsening symptoms.  Return if you have weakness in your arms or legs, slurred speech, trouble walking or talking, confusion, or trouble with your balance.  Please return if you have any other emergent concerns.  Additional Information:  Your vital signs today were: BP (!) 157/78   Pulse 88   Temp 98.3 F (36.8 C) (Oral)   Resp 13   Ht 5\' 2"  (1.575 m)   Wt 98.9 kg   LMP  (LMP Unknown)   SpO2 100%   BMI 39.88 kg/m  If your blood pressure (BP) was elevated above 135/85 this visit, please have this repeated by your doctor within one month. --------------

## 2022-07-02 NOTE — ED Triage Notes (Signed)
Pt is here for headache, numbness on the left leg and left hand since this morning

## 2022-07-02 NOTE — ED Notes (Signed)
Patient transported to MRI 

## 2022-07-02 NOTE — ED Provider Notes (Signed)
Kiara Ramirez   [id number] 07/02/22 Arrival Time: [id number]  ASSESSMENT & PLAN:  1. Acute nonintractable headache, unspecified headache type   2. Paresthesia of left arm   3. Paresthesia of left foot   4. Elevated blood pressure reading with diagnosis of hypertension    Last know well around 8:30 this morning. Cannot r/o CVA/TIA; discussed. Given mild but persistent symptoms, pt d/c to ED via POV. Stable upon d/c.  Recommend:  Follow-up Information     Go to  [facility] Emergency Department at [facility].   Specialty: Emergency Medicine Contact information: [address] [id number] [place] [phone]                 Reviewed expectations re: course of current medical issues. Questions answered. Outlined signs and symptoms indicating need for more acute intervention. Patient verbalized understanding. After Visit Summary given.   SUBJECTIVE: History from: Patient Patient is able to give a clear and coherent history.  Kiara Ramirez is a 72 y.o. female with HTN who presents with complaint of a headache shortly after waking this morning. With associated L hand and L leg/foot "tingling or numb sensation". Headache has eased but she still confirms this is present; poorly localized. Still with altered sensation of LUE and LLE. Normal ambulation. No speech abnormalities. Denies vision changes.  No h/o CVA.  OBJECTIVE:  Vitals:   07/02/22 1701  BP: (!) 143/89  Pulse: 79  Resp: 16  Temp: 98.5 F (36.9 C)  TempSrc: Oral  SpO2: 95%    General appearance: alert; NAD HENT: normocephalic; atraumatic Eyes: PERRLA; EOMI; conjunctivae normal Neck: supple with FROM Lungs: clear to auscultation bilaterally; unlabored respirations Heart: regular Extremities: no edema; symmetrical with no gross deformities Skin: warm and dry Neurologic: alert; speech is fluent and clear without dysarthria or aphasia; CN 2-12 grossly  intact; no facial droop; normal gait; normal symmetric reflexes; normal extremity strength; does reports slight decreased in sensation of LUE and LLE; normal grip strength; normal plantar and dorsi flexion bilaterally Psychological: alert and cooperative; normal mood and affect   Allergies  Allergen Reactions   Ace Inhibitors Swelling   Hydrocodone    Hydrocodone-Acetaminophen Nausea Only   Oxycodone Nausea And Vomiting    Headache    Past Medical History:  Diagnosis Date   Allergy    Anemia    Anxiety    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 meds   B12 deficiency    GERD (gastroesophageal reflux disease)    Gout    Hypertension    Social History   Socioeconomic History   Marital status: Married    Spouse name: Not on file   Number of children: 2   Years of education: 13   Highest education level: Not on file  Occupational History    Employer: [employer]  Tobacco Use   Smoking status: Never   Smokeless tobacco: Never  Vaping Use   Vaping Use: Never used  Substance and Sexual Activity   Alcohol use: No    Alcohol/week: 0.0 standard drinks of alcohol   Drug use: No   Sexual activity: Yes    Birth control/protection: Post-menopausal  Other Topics Concern   Not on file  Social History Narrative   Lives with husband in a one story home.  Has 2 children.   65 year old grandson lives with her.    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  Education: one year of college.    Social Determinants of Health   Financial Resource Strain: Not on file  Food Insecurity: Not on file  Transportation Needs: Not on file  Physical Activity: Not on file  Stress: Not on file  Social Connections: Not on file  Intimate Partner Violence: Not on file   Family History  Problem Relation Age of Onset   Heart failure Mother        No details   Anemia Father    Breast cancer Sister    Cancer Other        colon/endometrial   Hypertension Other        family hx   Arthritis Other        family  hx   Diabetes Other        family hx   Colon cancer Neg Hx    Esophageal cancer Neg Hx    Stomach cancer Neg Hx    Rectal cancer Neg Hx    Past Surgical History:  Procedure Laterality Date   COLONOSCOPY  12/16/2018   per Dr. Fuller Plan, incomplete exam     CYSTOSCOPY  04/30/2018   Procedure: CYSTOSCOPY;  Surgeon: Everett Graff, MD;  Location: [facility];  Service: Gynecology;;   DG BARIUM ENEMA ([place] HX)  01/18/2019   per Dr. Fuller Plan, incomplete exam, get CT colonography in 5 years    HYSTERECTOMY ABDOMINAL WITH SALPINGO-OOPHORECTOMY Bilateral 04/30/2018   Procedure: TOTAL ABDOMINAL HYSTERECTOMY WITH BILATERAL SALPINGO-OOPHORECTOMY;  Surgeon: Everett Graff, MD;  Location: [facility];  Service: Gynecology;  Laterality: Bilateral;   LAPAROSCOPY  04/30/2018   Procedure: LAPAROSCOPY DIAGNOSTIC;  Surgeon: Everett Graff, MD;  Location: [facility];  Service: Gynecology;;   Teodoro Kil, MD 07/02/22 [phone]

## 2022-07-02 NOTE — ED Triage Notes (Signed)
Pt c/o left arm and left leg numbness and tingling. Pt states the sx started at 0830 today. Pt LKW 0820 today. Pt c/o HTN

## 2022-07-02 NOTE — ED Provider Triage Note (Signed)
Emergency Medicine Provider Triage Evaluation Note  Kiara Ramirez , a 72 y.o. female  was evaluated in triage.  Pt complains of left arm numbness that began at 830 this morning.  Numbness has been continuous and patient states that she normally has numbness in her left arm however today is worse than normal.  Patient does have a history of B12 deficiency but states she is eating plain meats and vegetables.  Patient had any chest pain, shortness of breath, vision changes, headache, neck pain, abd pain, nausea/vomiting, dysuria, weakness  Review of Systems  Positive: See HPI Negative: See HPI  Physical Exam  LMP  (LMP Unknown)  Gen:   Awake, no distress   Resp:  Normal effort  MSK:   Moves extremities without difficulty  Other:  5 out of 5 bilateral grip strength, sensation intact distally, 2+ bilateral radial pulses, bilateral pupils PERRL, abdomen nontender to palpation  Medical Decision Making  Medically screening exam initiated at 6:00 PM.  Appropriate orders placed.  Kiara Ramirez was informed that the remainder of the evaluation will be completed by another provider, this initial triage assessment does not replace that evaluation, and the importance of remaining in the ED until their evaluation is complete.  Workup initiated, patient is hypertensive here at 179/100 but otherwise stable   Chuck Hint, [place] 07/02/22 1806

## 2022-07-03 ENCOUNTER — Ambulatory Visit (INDEPENDENT_AMBULATORY_CARE_PROVIDER_SITE_OTHER): Payer: PPO | Admitting: Family Medicine

## 2022-07-03 ENCOUNTER — Encounter: Payer: Self-pay | Admitting: Family Medicine

## 2022-07-03 VITALS — BP 130/80 | HR 74 | Temp 97.7°F | Wt 220.0 lb

## 2022-07-03 DIAGNOSIS — G4486 Cervicogenic headache: Secondary | ICD-10-CM | POA: Diagnosis not present

## 2022-07-03 DIAGNOSIS — M5412 Radiculopathy, cervical region: Secondary | ICD-10-CM

## 2022-07-03 DIAGNOSIS — R202 Paresthesia of skin: Secondary | ICD-10-CM

## 2022-07-03 DIAGNOSIS — R200 Anesthesia of skin: Secondary | ICD-10-CM

## 2022-07-03 MED ORDER — MELOXICAM 15 MG PO TABS: 15.0000 mg | ORAL_TABLET | Freq: Every day | ORAL | 3 refills | Status: DC

## 2022-07-03 MED ORDER — ALLOPURINOL 100 MG PO TABS: 100.0000 mg | ORAL_TABLET | Freq: Every day | ORAL | 3 refills | Status: DC

## 2022-07-03 MED ORDER — HYDROCHLOROTHIAZIDE 25 MG PO TABS: 25.0000 mg | ORAL_TABLET | Freq: Every day | ORAL | 3 refills | Status: DC

## 2022-07-03 MED ORDER — OMEPRAZOLE 40 MG PO CPDR: 40.0000 mg | DELAYED_RELEASE_CAPSULE | Freq: Every morning | ORAL | 3 refills | Status: DC

## 2022-07-03 MED ORDER — FAMOTIDINE 40 MG PO TABS: 40.0000 mg | ORAL_TABLET | Freq: Every day | ORAL | 3 refills | Status: DC

## 2022-07-03 MED ORDER — POTASSIUM CHLORIDE ER 10 MEQ PO TBCR: 20.0000 meq | EXTENDED_RELEASE_TABLET | Freq: Two times a day (BID) | ORAL | 3 refills | Status: DC

## 2022-07-03 NOTE — Progress Notes (Signed)
Subjective:    Patient ID: Kiara Ramirez, female    DOB: 07/07/1950, 72 y.o.   MRN: [id number]  HPI Here to follow up after an ED visit yesterday. She presented with a generalized headache with numbness and tingling in the left hand and left foot. This had been going on for about an hour. No chest pain or SOB. No other neurologic deficits. On arrival her BP was 157/78. Her exam was normal other than some decreased sensitivity to light touch on the left hand and left foot. EKG and CXR were normal. Labs were normal except a low potassium at 3.3 and a WBC of 12.4. A head CT was unremarkable, as was a brain MRI. While she was there the headache and the tingling all resolved. She has felt fine since going home. There was concern that she had been having symptoms of a stroke or TIA, but I think this unlikely. Today she feels fine other than having some stiffness in her neck.    Review of Systems  Constitutional: Negative.   HENT: Negative.    Eyes: Negative.   Respiratory: Negative.    Cardiovascular: Negative.   Gastrointestinal: Negative.   Genitourinary: Negative.   Musculoskeletal:  Positive for neck stiffness.  Neurological:  Positive for numbness and headaches. Negative for dizziness, tremors, seizures, syncope, facial asymmetry, speech difficulty, weakness and light-headedness.       Objective:   Physical Exam Constitutional:      Appearance: Normal appearance. She is not ill-appearing.  HENT:     Right Ear: Tympanic membrane, ear canal and external ear normal.     Left Ear: Tympanic membrane, ear canal and external ear normal.     Nose: Nose normal.     Mouth/Throat:     Pharynx: Oropharynx is clear.  Eyes:     Conjunctiva/sclera: Conjunctivae normal.     Pupils: Pupils are equal, round, and reactive to light.  Neck:     Comments: She is very tender along the left side of the posterior neck and into the upper left trapezius. Her neck ROM is limited by pain. Her right shoulder is  normal.  Cardiovascular:     Rate and Rhythm: Normal rate and regular rhythm.     Pulses: Normal pulses.     Heart sounds: Normal heart sounds.  Pulmonary:     Effort: Pulmonary effort is normal.     Breath sounds: Normal breath sounds.  Lymphadenopathy:     Cervical: No cervical adenopathy.  Neurological:     General: No focal deficit present.     Mental Status: She is alert and oriented to person, place, and time.     Cranial Nerves: No cranial nerve deficit.     Sensory: No sensory deficit.     Motor: No weakness.     Coordination: Coordination normal.     Gait: Gait normal.           Assessment & Plan:  She has had some numbness in the left hand and left foot along with a headache , and I think this all stems from pinched nerves in her neck. 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 I believe she was having some radicular symptoms yesterday. We will refer her to Sports Medicine to evaluate and treat this. We spent a total of ( 35  ) minutes reviewing records and discussing these issues.  Alysia Penna, MD

## 2022-07-08 ENCOUNTER — Telehealth: Payer: Self-pay

## 2022-07-08 ENCOUNTER — Telehealth: Payer: Self-pay | Admitting: Family Medicine

## 2022-07-08 NOTE — Telephone Encounter (Signed)
error 

## 2022-07-08 NOTE — Telephone Encounter (Signed)
        Patient  visited [facility] on 3/19     Telephone encounter attempt :1st    A HIPAA compliant voice message was left requesting a return call.  Instructed patient to call back .   [facility] [phone], [place], [place] [address] Phone: [phone] Email: [email or web address]

## 2022-07-09 ENCOUNTER — Encounter: Payer: Self-pay | Admitting: Family Medicine

## 2022-07-09 ENCOUNTER — Telehealth: Payer: Self-pay

## 2022-07-09 ENCOUNTER — Ambulatory Visit (INDEPENDENT_AMBULATORY_CARE_PROVIDER_SITE_OTHER): Payer: PPO | Admitting: Family Medicine

## 2022-07-09 VITALS — BP 124/82 | HR 90 | Temp 97.8°F | Wt 220.0 lb

## 2022-07-09 DIAGNOSIS — R202 Paresthesia of skin: Secondary | ICD-10-CM

## 2022-07-09 DIAGNOSIS — E876 Hypokalemia: Secondary | ICD-10-CM | POA: Diagnosis not present

## 2022-07-09 DIAGNOSIS — I10 Essential (primary) hypertension: Secondary | ICD-10-CM

## 2022-07-09 DIAGNOSIS — R200 Anesthesia of skin: Secondary | ICD-10-CM

## 2022-07-09 DIAGNOSIS — J3089 Other allergic rhinitis: Secondary | ICD-10-CM

## 2022-07-09 DIAGNOSIS — E538 Deficiency of other specified B group vitamins: Secondary | ICD-10-CM | POA: Diagnosis not present

## 2022-07-09 LAB — BASIC METABOLIC PANEL
BUN: 14 mg/dL (ref 6–23)
CO2: 31 mEq/L (ref 19–32)
Calcium: 9 mg/dL (ref 8.4–10.5)
Chloride: 100 mEq/L (ref 96–112)
Creatinine, Ser: 0.98 mg/dL (ref 0.40–1.20)
GFR: 58.42 mL/min — ABNORMAL LOW (ref >60.00)
Glucose, Bld: 86 mg/dL (ref 70–99)
Potassium: 4 mEq/L (ref 3.5–5.1)
Sodium: 140 mEq/L (ref 135–145)

## 2022-07-09 LAB — VITAMIN B12: Vitamin B-12: 218 pg/mL (ref 211–911)

## 2022-07-09 NOTE — Telephone Encounter (Signed)
     Patient  visit on 3/18  at [facility]    Have you been able to follow up with your primary care physician? Yes   The patient was or was not able to obtain any needed medicine or equipment. Yes   Are there diet recommendations that you are having difficulty following? Yes   Patient expresses understanding of discharge instructions and education provided has no other needs at this time.  Yes      [facility] [phone] [address], [place], [place] [address] Phone: [phone] Email: [email or web address]

## 2022-07-09 NOTE — Progress Notes (Signed)
   Subjective:    Patient ID: Kiara Ramirez, female    DOB: October 27, 1950, 72 y.o.   MRN: [id number]  HPI Here with lots of questions relating to her ED visit on 07-01-21 when she presented with headache and numbness in the left hand and left foot. Labs were remarkable for a low potassium. 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 An MRI of the brain was normal, so a stroke was ruled out. Her BP was up that day. She was sent home and then saw [place] on 07-03-22. At that time the headache was gone, and it has stayed away ever since. For the hypokalemia, we increase the Kolr-con to taking 10 mEq BID. The most likely etiology for the numbness in the left foot and hand was felt to be cervical disc disease. We referred her to Sports Medicine for this, but ahs has not talked to that office yet. Today she brings in some records from the ED visit including 3 EKG's. She says why are these "abnormal". She asks [place] to check her B12 level today because of the numbness. She asks if she could have blood clots in her arms or legs. She also asks what she can do for sinus congestion. Her allergies have been acting up.    Review of Systems  Constitutional: Negative.   HENT:  Positive for congestion and sinus pressure. Negative for ear pain, facial swelling and postnasal drip.   Respiratory: Negative.    Cardiovascular: Negative.   Neurological:  Positive for numbness. Negative for weakness and headaches.       Objective:   Physical Exam Constitutional:      Appearance: Normal appearance. She is not ill-appearing.  Cardiovascular:     Rate and Rhythm: Normal rate and regular rhythm.     Pulses: Normal pulses.     Heart sounds: Normal heart sounds.  Pulmonary:     Effort: Pulmonary effort is normal.     Breath sounds: Normal breath sounds.  Neurological:     General: No focal deficit present.     Mental Status: She is alert and oriented to person, place, and time.     Cranial Nerves: No cranial nerve  deficit.     Motor: No weakness.     Gait: Gait normal.           Assessment & Plan:  First we reviewed her EKG's and the only abnormality was LAFB which is stable. I reassured her this had nothing to do with her symptoms.for the sinus stuffiness and allergies, she can use Flonase daily. For the hand and foot numbness, I encouraged her to see Sports Medicine asap. We will get labs today to check potassium and B12 levels. I told her there was no evidence of blood clots in her arms or legs. We spent a total of ( 35  ) minutes reviewing records and discussing these issues.  Alysia Penna, MD

## 2022-07-10 DIAGNOSIS — Z01419 Encounter for gynecological examination (general) (routine) without abnormal findings: Secondary | ICD-10-CM | POA: Diagnosis not present

## 2022-07-10 DIAGNOSIS — Z13820 Encounter for screening for osteoporosis: Secondary | ICD-10-CM | POA: Diagnosis not present

## 2022-07-10 DIAGNOSIS — Z1231 Encounter for screening mammogram for malignant neoplasm of breast: Secondary | ICD-10-CM | POA: Diagnosis not present

## 2022-08-09 ENCOUNTER — Encounter: Payer: Self-pay | Admitting: Family Medicine

## 2022-08-09 ENCOUNTER — Ambulatory Visit (INDEPENDENT_AMBULATORY_CARE_PROVIDER_SITE_OTHER): Payer: PPO | Admitting: Family Medicine

## 2022-08-09 ENCOUNTER — Encounter: Payer: PPO | Admitting: Family Medicine

## 2022-08-09 VITALS — BP 128/80 | HR 68 | Temp 98.1°F | Ht 62.0 in | Wt 221.0 lb

## 2022-08-09 DIAGNOSIS — Z0000 Encounter for general adult medical examination without abnormal findings: Secondary | ICD-10-CM

## 2022-08-09 DIAGNOSIS — R944 Abnormal results of kidney function studies: Secondary | ICD-10-CM

## 2022-08-09 DIAGNOSIS — I10 Essential (primary) hypertension: Secondary | ICD-10-CM

## 2022-08-09 LAB — BASIC METABOLIC PANEL
BUN: 24 mg/dL — ABNORMAL HIGH (ref 6–23)
CO2: 30 mEq/L (ref 19–32)
Calcium: 9 mg/dL (ref 8.4–10.5)
Chloride: 102 mEq/L (ref 96–112)
Creatinine, Ser: 1.56 mg/dL — ABNORMAL HIGH (ref 0.40–1.20)
GFR: 33.42 mL/min — ABNORMAL LOW (ref >60.00)
Glucose, Bld: 89 mg/dL (ref 70–99)
Potassium: 3.7 mEq/L (ref 3.5–5.1)
Sodium: 141 mEq/L (ref 135–145)

## 2022-08-09 LAB — CBC WITH DIFFERENTIAL/PLATELET
Basophils Absolute: 0 10*3/uL (ref 0.0–0.1)
Basophils Relative: 0.3 % (ref 0.0–3.0)
Eosinophils Absolute: 0.1 10*3/uL (ref 0.0–0.7)
Eosinophils Relative: 1.1 % (ref 0.0–5.0)
HCT: 37 % (ref 36.0–46.0)
Hemoglobin: 12.4 g/dL (ref 12.0–15.0)
Lymphocytes Relative: 22.4 % (ref 12.0–46.0)
Lymphs Abs: 1.8 10*3/uL (ref 0.7–4.0)
MCHC: 33.5 g/dL (ref 30.0–36.0)
MCV: 87.7 fl (ref 78.0–100.0)
Monocytes Absolute: 0.6 10*3/uL (ref 0.1–1.0)
Monocytes Relative: 7.8 % (ref 3.0–12.0)
Neutro Abs: 5.6 10*3/uL (ref 1.4–7.7)
Neutrophils Relative %: 68.4 % (ref 43.0–77.0)
Platelets: 382 10*3/uL (ref 150.0–400.0)
RBC: 4.22 Mil/uL (ref 3.87–5.11)
RDW: 15.4 % (ref 11.5–15.5)
WBC: 8.1 10*3/uL (ref 4.0–10.5)

## 2022-08-09 LAB — LIPID PANEL
Cholesterol: 114 mg/dL (ref 0–200)
HDL: 36.7 mg/dL — ABNORMAL LOW (ref >39.00)
LDL Cholesterol: 65 mg/dL (ref 0–99)
NonHDL: 77.32
Total CHOL/HDL Ratio: 3
Triglycerides: 64 mg/dL (ref 0.0–149.0)
VLDL: 12.8 mg/dL (ref 0.0–40.0)

## 2022-08-09 LAB — HEPATIC FUNCTION PANEL
ALT: 8 U/L (ref 0–35)
AST: 13 U/L (ref 0–37)
Albumin: 3.4 g/dL — ABNORMAL LOW (ref 3.5–5.2)
Alkaline Phosphatase: 88 U/L (ref 39–117)
Bilirubin, Direct: 0 mg/dL (ref 0.0–0.3)
Total Bilirubin: 0.3 mg/dL (ref 0.2–1.2)
Total Protein: 8.2 g/dL (ref 6.0–8.3)

## 2022-08-09 LAB — TSH: TSH: 1.7 u[IU]/mL (ref 0.35–5.50)

## 2022-08-09 LAB — HEMOGLOBIN A1C: Hgb A1c MFr Bld: 6.1 % (ref 4.6–6.5)

## 2022-08-09 NOTE — Progress Notes (Signed)
Subjective:    Patient ID: Kiara Ramirez, female    DOB: 1950/07/06, 72 y.o.   MRN: [id number]  HPI Here for a well exam. She is doing well in general. She still has some tingling in the left hand and left leg, as well as left knee pain. She is waiting to hear from Dr. Belia Heman office for a Sports Medicine evaluation. We found her B12 to be borderline low on 07-09-22 at 218, so she has begun taking a 1000 mcg B12 tablet every day. She saw Dr. Su Hilt for a GYN exam recently.    Review of Systems  Constitutional: Negative.   HENT: Negative.    Eyes: Negative.   Respiratory: Negative.    Cardiovascular: Negative.   Gastrointestinal: Negative.   Genitourinary:  Negative for decreased urine volume, difficulty urinating, dyspareunia, dysuria, enuresis, flank pain, frequency, hematuria, pelvic pain and urgency.  Musculoskeletal:  Positive for arthralgias and neck pain.  Skin: Negative.   Neurological:  Positive for numbness. Negative for headaches.  Psychiatric/Behavioral: Negative.         Objective:   Physical Exam Constitutional:      General: She is not in acute distress.    Appearance: She is well-developed. She is obese.  HENT:     Head: Normocephalic and atraumatic.     Right Ear: External ear normal.     Left Ear: External ear normal.     Nose: Nose normal.     Mouth/Throat:     Pharynx: No oropharyngeal exudate.  Eyes:     General: No scleral icterus.    Conjunctiva/sclera: Conjunctivae normal.     Pupils: Pupils are equal, round, and reactive to light.  Neck:     Thyroid: No thyromegaly.     Vascular: No JVD.  Cardiovascular:     Rate and Rhythm: Normal rate and regular rhythm.     Pulses: Normal pulses.     Heart sounds: Normal heart sounds. No murmur heard.    No friction rub. No gallop.  Pulmonary:     Effort: Pulmonary effort is normal. No respiratory distress.     Breath sounds: Normal breath sounds. No wheezing or rales.  Chest:     Chest wall: No  tenderness.  Abdominal:     General: Bowel sounds are normal. There is no distension.     Palpations: Abdomen is soft. There is no mass.     Tenderness: There is no abdominal tenderness. There is no guarding or rebound.  Musculoskeletal:        General: No tenderness. Normal range of motion.     Cervical back: Normal range of motion and neck supple.  Lymphadenopathy:     Cervical: No cervical adenopathy.  Skin:    General: Skin is warm and dry.     Findings: No erythema or rash.  Neurological:     Mental Status: She is alert and oriented to person, place, and time.     Cranial Nerves: No cranial nerve deficit.     Motor: No abnormal muscle tone.     Coordination: Coordination normal.     Deep Tendon Reflexes: Reflexes are normal and symmetric. Reflexes normal.  Psychiatric:        Behavior: Behavior normal.        Thought Content: Thought content normal.        Judgment: Judgment normal.           Assessment & Plan:  Well exam. We discussed diet and  exercise. Get fasting labs. Alysia Penna, MD

## 2022-08-09 NOTE — Addendum Note (Signed)
Addended by: Gershon Crane A on: 08/09/2022 12:48 PM   Modules accepted: Orders

## 2022-08-13 NOTE — Addendum Note (Signed)
Addended by: Johnella Moloney on: 08/13/2022 10:11 AM   Modules accepted: Orders

## 2022-08-19 NOTE — Progress Notes (Unsigned)
[name] [address] [place] [address] Phone: [phone] Subjective:   Bruce Donath, am serving as a scribe for Dr. Antoine Primas.  I'm seeing this patient by the request  of:  Nelwyn Salisbury, MD  CC: Neck pain, neck pain  [id number]  Jamirrah Draft is a 72 y.o. female coming in with complaint of cervical radiculopathy. Patient states that she has had intermittent pain neck for a while. Numbness and tingling in L hand. Pain in L trap also radiates up into her head.   Chronic L knee pain. Swelling is intermittent. Standing increases her pain.    Patient did have an MRI of the cervical spine in May 2023.  Was found to have moderate to severe right foraminal stenosis at C5-C6 and left side at C3-C5.  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 Russella Dar, incomplete exam     COLONOSCOPY  02/05/2022   Cologuard test was negative   CYSTOSCOPY  04/30/2018   Procedure: CYSTOSCOPY;  Surgeon: Osborn Coho, MD;  Location: [facility];  Service: Gynecology;;   DG BARIUM ENEMA ([facility] HX)  01/18/2019   per Dr. Russella Dar, incomplete exam, get CT colonography in 5 years    HYSTERECTOMY ABDOMINAL WITH SALPINGO-OOPHORECTOMY Bilateral 04/30/2018   Procedure: TOTAL ABDOMINAL HYSTERECTOMY WITH BILATERAL SALPINGO-OOPHORECTOMY;  Surgeon: Osborn Coho, MD;  Location: [facility];  Service: Gynecology;  Laterality: Bilateral;   LAPAROSCOPY  04/30/2018   Procedure: LAPAROSCOPY DIAGNOSTIC;  Surgeon: Osborn Coho, MD;  Location: [facility];  Service: Gynecology;;   TUBAL LIGATION     Social History   Socioeconomic History   Marital status: Married    Spouse name: Not on  file   Number of children: 2   Years of education: 13   Highest education level: Not on file  Occupational History    Employer: [employer]  Tobacco Use   Smoking status: Never   Smokeless tobacco: Never  Vaping Use   Vaping Use: Never used  Substance and Sexual Activity   Alcohol use: No    Alcohol/week: 0.0 standard drinks of alcohol   Drug use: No   Sexual activity: Yes    Birth control/protection: Post-menopausal  Other Topics Concern   Not on file  Social History Narrative   Lives with husband in a one story home.  Has 2 children.   80 year old grandson lives with her.    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     Education: one year of college.    Social Determinants of Health   Financial Resource Strain: Not on file  Food Insecurity: Not on file  Transportation Needs: Not on file  Physical Activity: Not on file  Stress: Not on file  Social Connections: Not on file   Allergies  Allergen Reactions   Ace Inhibitors Swelling   Hydrocodone    Hydrocodone-Acetaminophen Nausea Only   Oxycodone Nausea And Vomiting    Headache   Family History  Problem Relation Age of Onset   Heart failure Mother  No details   Anemia Father    Breast cancer Sister    Cancer Other        colon/endometrial   Hypertension Other        family hx   Arthritis Other        family hx   Diabetes Other        family hx   Colon cancer Neg Hx    Esophageal cancer Neg Hx    Stomach cancer Neg Hx    Rectal cancer Neg Hx      Current Outpatient Medications (Cardiovascular):    hydrochlorothiazide (HYDRODIURIL) 25 MG tablet, Take 1 tablet (25 mg total) by mouth daily.   Current Outpatient Medications (Analgesics):    allopurinol (ZYLOPRIM) 100 MG tablet, Take 1 tablet (100 mg total) by mouth daily.   aspirin 81 MG tablet, Take 81 mg by mouth daily.   meloxicam (MOBIC) 15 MG tablet, Take 1 tablet (15 mg total) by mouth daily.   Current Outpatient Medications (Other):     clobetasol ointment (TEMOVATE) 0.05 %, clobetasol 0.05 % topical ointment  APPLY A THIN LAYER TO THE AFFECTED AREA TWICE DAILY FOR 2 WEEKS   famotidine (PEPCID) 40 MG tablet, Take 1 tablet (40 mg total) by mouth at bedtime.   lidocaine (XYLOCAINE) 2 % solution, Use as directed 15 mLs in the mouth or throat as needed for mouth pain.   omeprazole (PRILOSEC) 40 MG capsule, Take 1 capsule (40 mg total) by mouth in the morning.   potassium chloride (KLOR-CON) 10 MEQ tablet, Take 2 tablets (20 mEq total) by mouth 2 (two) times daily.   Reviewed prior external information including notes and imaging from  primary care provider As well as notes that were available from care everywhere and other healthcare systems.  Past medical history, social, surgical and family history all reviewed in electronic medical record.  No pertanent information unless stated regarding to the chief complaint.   Review of Systems:  No headache, visual changes, nausea, vomiting, diarrhea, constipation, dizziness, abdominal pain, skin rash, fevers, chills, night sweats, weight loss, swollen lymph nodes, body aches, joint swelling, chest pain, shortness of breath, mood changes. POSITIVE muscle aches  Objective  Blood pressure 122/78, pulse 76, height 5\' 2"  (1.575 m), weight 217 lb (98.4 kg), SpO2 96 %.   General: No apparent distress alert and oriented x3 mood and affect normal, dressed appropriately.  HEENT: Pupils equal, extraocular movements intact  Respiratory: Patient's speak in full sentences and does not appear short of breath  Cardiovascular: No lower extremity edema, non tender, no erythema  Neck exam shows patient with some limited range of motion but negative Spurling's.  Patient does have trigger points noted in the left trapezius and rhomboid musculature as well as medial upper scapula.  Patient does have some limitation of sidebending as well as extension of the neck noted.  Patient's left knee does have  significant arthritic changes noted of the medial compartment of the knee.  Patient does have crepitus of the patella area.  Patient does have instability noted with valgus and varus force but patient is ambulatory   [address]71[address]; 15 additional minutes spent for Therapeutic exercises as stated in above notes.  This included exercises focusing on stretching, strengthening, with significant focus on eccentric aspects.   Long term goals include an improvement in range of motion, strength, endurance as well as avoiding reinjury. Patient's frequency would include in 1-2 times a day, 3-5 times a week for a duration  of 6-12 weeks. Exercises that included:  Basic scapular stabilization to include adduction and depression of scapula Scaption, focusing on proper movement and good control Internal and External rotation utilizing a theraband, with elbow tucked at side entire time Rows with theraband   Proper technique shown and discussed handout in great detail with ATC.  All questions were discussed and answered.     Impression and Recommendations:     The above documentation has been reviewed and is accurate and complete Judi Saa, DO

## 2022-08-20 ENCOUNTER — Ambulatory Visit (INDEPENDENT_AMBULATORY_CARE_PROVIDER_SITE_OTHER): Payer: PPO | Admitting: Family Medicine

## 2022-08-20 ENCOUNTER — Encounter: Payer: Self-pay | Admitting: Family Medicine

## 2022-08-20 VITALS — BP 122/78 | HR 76 | Ht 62.0 in | Wt 217.0 lb

## 2022-08-20 DIAGNOSIS — M25512 Pain in left shoulder: Secondary | ICD-10-CM | POA: Diagnosis not present

## 2022-08-20 DIAGNOSIS — M1712 Unilateral primary osteoarthritis, left knee: Secondary | ICD-10-CM

## 2022-08-20 NOTE — Assessment & Plan Note (Addendum)
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  Instability with valgus and varus force.  Patient is ambulatory discussed icing regimen and home exercises, which activities to do more things to avoid, increase activity slowly over the course of next several weeks.  Follow-up again in 6 to 8 weeks.  Patient declined formal physical therapy.  Declined the possibility of injections today.

## 2022-08-20 NOTE — Assessment & Plan Note (Signed)
Patient's pain seems to be more of a trigger point in the left shoulder girdle area.  Patient declined an injection.  Differential includes more of a cervical radiculopathy.  Has x-ray showing fairly significant arthritic changes.  Discussed with potential for the MRI and injection but at this moment patient also declined that.  He would like to work with scapular exercises first.  Increase activity slowly otherwise.  Follow-up again in 6 to 8 weeks

## 2022-08-20 NOTE — Patient Instructions (Signed)
Exercises scapular OA stability L knee Keep working on weight loss See me in 6 weeks

## 2022-09-10 ENCOUNTER — Other Ambulatory Visit (INDEPENDENT_AMBULATORY_CARE_PROVIDER_SITE_OTHER): Payer: PPO

## 2022-09-10 DIAGNOSIS — R944 Abnormal results of kidney function studies: Secondary | ICD-10-CM

## 2022-09-10 LAB — BASIC METABOLIC PANEL
BUN: 24 mg/dL — ABNORMAL HIGH (ref 6–23)
CO2: 32 mEq/L (ref 19–32)
Calcium: 9.1 mg/dL (ref 8.4–10.5)
Chloride: 96 mEq/L (ref 96–112)
Creatinine, Ser: 1.48 mg/dL — ABNORMAL HIGH (ref 0.40–1.20)
GFR: 35.58 mL/min — ABNORMAL LOW (ref >60.00)
Glucose, Bld: 87 mg/dL (ref 70–99)
Potassium: 3.6 mEq/L (ref 3.5–5.1)
Sodium: 137 mEq/L (ref 135–145)

## 2022-09-17 ENCOUNTER — Other Ambulatory Visit: Payer: Self-pay | Admitting: *Deleted

## 2022-09-17 DIAGNOSIS — M1712 Unilateral primary osteoarthritis, left knee: Secondary | ICD-10-CM | POA: Diagnosis not present

## 2022-09-17 DIAGNOSIS — R944 Abnormal results of kidney function studies: Secondary | ICD-10-CM

## 2022-09-30 NOTE — Progress Notes (Deleted)
[facility] [address] [place] [address] Phone: [phone] Subjective:    I'm seeing this patient by the request  of:  Nelwyn Salisbury, MD  CC: left knee and left shoulder pain follow up   [id number]  08/20/2022 Patient's pain seems to be more of a trigger point in the left shoulder girdle area.  Patient declined an injection.  Differential includes more of a cervical radiculopathy.  Has x-ray showing fairly significant arthritic changes.  Discussed with potential for the MRI and injection but at this moment patient also declined that.  He would like to work with scapular exercises first.  Increase activity slowly otherwise.  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  Instability with valgus and varus force.  Patient is ambulatory discussed icing regimen and home exercises, which activities to do more things to avoid, increase activity slowly over the course of next several weeks.  Follow-up again in 6 to 8 weeks.  Patient declined formal physical therapy.  Declined the possibility of injections today.     Update 10/01/2022 Kiara Ramirez is a 71 y.o. female coming in with complaint of L knee and L shoulder pain.  Patient was given some home exercises and we discussed that the differential includes cervical radiculopathy.  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 Russella Dar, incomplete exam     COLONOSCOPY  02/05/2022   Cologuard test was negative   CYSTOSCOPY  04/30/2018   Procedure: CYSTOSCOPY;  Surgeon: Osborn Coho, MD;  Location: [facility];  Service: Gynecology;;   DG BARIUM ENEMA ([facility] HX)  01/18/2019    per Dr. Russella Dar, incomplete exam, get CT colonography in 5 years    HYSTERECTOMY ABDOMINAL WITH SALPINGO-OOPHORECTOMY Bilateral 04/30/2018   Procedure: TOTAL ABDOMINAL HYSTERECTOMY WITH BILATERAL SALPINGO-OOPHORECTOMY;  Surgeon: Osborn Coho, MD;  Location: [facility];  Service: Gynecology;  Laterality: Bilateral;   LAPAROSCOPY  04/30/2018   Procedure: LAPAROSCOPY DIAGNOSTIC;  Surgeon: Osborn Coho, MD;  Location: [facility];  Service: Gynecology;;   TUBAL LIGATION     Social History   Socioeconomic History   Marital status: Married    Spouse name: Not on file   Number of children: 2   Years of education: 13   Highest education level: Not on file  Occupational History    Employer: [employer]  Tobacco Use   Smoking status: Never   Smokeless tobacco: Never  Vaping Use   Vaping Use: Never used  Substance and Sexual Activity   Alcohol use: No    Alcohol/week: 0.0 standard drinks of alcohol   Drug use: No   Sexual activity: Yes    Birth control/protection: Post-menopausal  Other Topics Concern   Not on file  Social History Narrative   Lives with husband in a one story home.  Has 2 children.   78 year old grandson lives with her.    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     Education: one year of college.  Social Determinants of Health   Financial Resource Strain: Not on file  Food Insecurity: Not on file  Transportation Needs: Not on file  Physical Activity: Not on file  Stress: Not on file  Social Connections: Not on file   Allergies  Allergen Reactions   Ace Inhibitors Swelling   Hydrocodone    Hydrocodone-Acetaminophen Nausea Only   Oxycodone Nausea And Vomiting    Headache   Family History  Problem Relation Age of Onset   Heart failure Mother        No details   Anemia Father    Breast cancer Sister    Cancer Other        colon/endometrial   Hypertension Other        family hx   Arthritis Other        family hx   Diabetes Other        family hx    Colon cancer Neg Hx    Esophageal cancer Neg Hx    Stomach cancer Neg Hx    Rectal cancer Neg Hx      Current Outpatient Medications (Cardiovascular):    hydrochlorothiazide (HYDRODIURIL) 25 MG tablet, Take 1 tablet (25 mg total) by mouth daily.   Current Outpatient Medications (Analgesics):    allopurinol (ZYLOPRIM) 100 MG tablet, Take 1 tablet (100 mg total) by mouth daily.   aspirin 81 MG tablet, Take 81 mg by mouth daily.   meloxicam (MOBIC) 15 MG tablet, Take 1 tablet (15 mg total) by mouth daily.   Current Outpatient Medications (Other):    clobetasol ointment (TEMOVATE) 0.05 %, clobetasol 0.05 % topical ointment  APPLY A THIN LAYER TO THE AFFECTED AREA TWICE DAILY FOR 2 WEEKS   famotidine (PEPCID) 40 MG tablet, Take 1 tablet (40 mg total) by mouth at bedtime.   lidocaine (XYLOCAINE) 2 % solution, Use as directed 15 mLs in the mouth or throat as needed for mouth pain.   omeprazole (PRILOSEC) 40 MG capsule, Take 1 capsule (40 mg total) by mouth in the morning.   potassium chloride (KLOR-CON) 10 MEQ tablet, Take 2 tablets (20 mEq total) by mouth 2 (two) times daily.   Reviewed prior external information including notes and imaging from  primary care provider As well as notes that were available from care everywhere and other healthcare systems.  Past medical history, social, surgical and family history all reviewed in electronic medical record.  No pertanent information unless stated regarding to the chief complaint.   Review of Systems:  No headache, visual changes, nausea, vomiting, diarrhea, constipation, dizziness, abdominal pain, skin rash, fevers, chills, night sweats, weight loss, swollen lymph nodes, body aches, joint swelling, chest pain, shortness of breath, mood changes. POSITIVE muscle aches  Objective  There were no vitals taken for this visit.   General: No apparent distress alert and oriented x3 mood and affect normal, dressed appropriately.  HEENT: Pupils  equal, extraocular movements intact  Respiratory: Patient's speak in full sentences and does not appear short of breath  Cardiovascular: No lower extremity edema, non tender, no erythema  Antalgic gait     Impression and Recommendations:    The above documentation has been reviewed and is accurate and complete Judi Saa, DO

## 2022-10-01 ENCOUNTER — Ambulatory Visit (HOSPITAL_COMMUNITY)
Admission: EM | Admit: 2022-10-01 | Discharge: 2022-10-01 | Disposition: A | Discharge status: Home / Self Care | Payer: PPO | Attending: Emergency Medicine | Admitting: Emergency Medicine

## 2022-10-01 ENCOUNTER — Other Ambulatory Visit: Payer: Self-pay

## 2022-10-01 ENCOUNTER — Ambulatory Visit: Payer: PPO | Admitting: Family Medicine

## 2022-10-01 ENCOUNTER — Encounter (HOSPITAL_COMMUNITY): Payer: Self-pay | Admitting: Emergency Medicine

## 2022-10-01 DIAGNOSIS — M109 Gout, unspecified: Secondary | ICD-10-CM

## 2022-10-01 MED ORDER — TRIAMCINOLONE ACETONIDE 40 MG/ML IJ SUSP
INTRAMUSCULAR | Status: AC
  Filled 2022-10-01: qty 1

## 2022-10-01 MED ORDER — TRIAMCINOLONE ACETONIDE 40 MG/ML IJ SUSP
40.0000 mg | Freq: Once | INTRAMUSCULAR | Status: AC
  Administered 2022-10-01: 40 mg via INTRAMUSCULAR

## 2022-10-01 MED ORDER — PREDNISONE 10 MG (21) PO TBPK: ORAL_TABLET | Freq: Every day | ORAL | 0 refills | Status: DC

## 2022-10-01 NOTE — Discharge Instructions (Addendum)
You are having a gout flare in your left foot. Please take the steroid dose pack as prescribed, take in the mornings with breakfast.  I have attached information for a low purine eating plan.  Follow-up with your primary care regarding your kidney function and allopurinol for gout prevention.  Please return to clinic for any new or concerning symptoms.

## 2022-10-01 NOTE — ED Triage Notes (Signed)
Pt c/o gout pain on her left foot since yesterday, no able to sleep due to pain.

## 2022-10-01 NOTE — ED Provider Notes (Signed)
[facility]    CSN: [id number] Arrival date & time: 10/01/22  4098      History   Chief Complaint Chief Complaint  Patient presents with   Foot Pain    Pt c/o gout pain on her left foot since yesterday, no able to sleep due to pain.    HPI Kiara Ramirez is a 72 y.o. female.   Patient presents to clinic for left foot/great toe pain.  Last night she noted she could hardly walk due to the pain, she cannot place a sheet on her foot this morning.  She presents to clinic in a wheelchair, is normally ambulatory.  Over the weekend she did go to the beach, she did have salmon and a salad with some black beans.  She does not drink alcohol, does drink decaf coffee.  She has been off of allopurinol.  Her PCP reported that her kidney function has been diminished, so she came off her meloxicam and her allopurinol.  She did not has any recent falls, cuts or trauma.   She has a history of gout, reports this is similar to her previous flares, has not had a flare in a while.    The history is provided by the patient and medical records.  Foot Pain    Past Medical History:  Diagnosis Date   Allergy    Anemia    Anxiety    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 meds   B12 deficiency    GERD (gastroesophageal reflux disease)    Gout    Hypertension     Patient Active Problem List   Diagnosis Date Noted   Degenerative arthritis of left knee 08/20/2022   Trigger point of left shoulder region 08/20/2022   Hypokalemia 07/09/2022   GERD (gastroesophageal reflux disease) 06/09/2019   Gout 08/13/2018   Postmenopausal bleeding 04/30/2018   Post-menopausal bleeding 04/30/2018   Chronic pain of left knee 10/06/2017   HYPERTROPHY OF TONSILS ALONE 03/07/2008   Anxiety state 04/20/2007   Weakness generalized 04/01/2007   B12 deficiency 02/25/2007   Essential hypertension 01/23/2007   Headache 01/23/2007    Past Surgical History:  Procedure Laterality Date   COLONOSCOPY  12/16/2018   per  Dr. Russella Dar, incomplete exam     COLONOSCOPY  02/05/2022   Cologuard test was negative   CYSTOSCOPY  04/30/2018   Procedure: CYSTOSCOPY;  Surgeon: Osborn Coho, MD;  Location: [facility];  Service: Gynecology;;   DG BARIUM ENEMA ([facility] HX)  01/18/2019   per Dr. Russella Dar, incomplete exam, get CT colonography in 5 years    HYSTERECTOMY ABDOMINAL WITH SALPINGO-OOPHORECTOMY Bilateral 04/30/2018   Procedure: TOTAL ABDOMINAL HYSTERECTOMY WITH BILATERAL SALPINGO-OOPHORECTOMY;  Surgeon: Osborn Coho, MD;  Location: [facility];  Service: Gynecology;  Laterality: Bilateral;   LAPAROSCOPY  04/30/2018   Procedure: LAPAROSCOPY DIAGNOSTIC;  Surgeon: Osborn Coho, MD;  Location: [facility];  Service: Gynecology;;   TUBAL LIGATION      OB History   No obstetric history on file.      Home Medications    Prior to Admission medications   Medication Sig Start Date End Date Taking? Authorizing Provider  predniSONE (STERAPRED UNI-PAK 21 TAB) 10 MG (21) TBPK tablet Take by mouth daily. Take as prescribed. 10/01/22  Yes Rinaldo Ratel, [place] N, FNP  allopurinol (ZYLOPRIM) 100 MG tablet Take 1 tablet (100 mg total) by mouth daily. 07/03/22   Nelwyn Salisbury, MD  aspirin 81 MG tablet Take 81 mg by mouth daily.  [provider]  clobetasol ointment (TEMOVATE) 0.05 % clobetasol 0.05 % topical ointment  APPLY A THIN LAYER TO THE AFFECTED AREA TWICE DAILY FOR 2 WEEKS    [provider]  famotidine (PEPCID) 40 MG tablet Take 1 tablet (40 mg total) by mouth at bedtime. 07/03/22   Nelwyn Salisbury, MD  hydrochlorothiazide (HYDRODIURIL) 25 MG tablet Take 1 tablet (25 mg total) by mouth daily. 07/03/22   Nelwyn Salisbury, MD  lidocaine (XYLOCAINE) 2 % solution Use as directed 15 mLs in the mouth or throat as needed for mouth pain. 03/30/21   Valinda Hoar, NP  meloxicam (MOBIC) 15 MG tablet Take 1 tablet (15 mg total) by mouth daily. 07/03/22   Nelwyn Salisbury, MD  omeprazole (PRILOSEC) 40 MG capsule Take 1 capsule  (40 mg total) by mouth in the morning. 07/03/22   Nelwyn Salisbury, MD  potassium chloride (KLOR-CON) 10 MEQ tablet Take 2 tablets (20 mEq total) by mouth 2 (two) times daily. 07/03/22   Nelwyn Salisbury, MD    Family History Family History  Problem Relation Age of Onset   Heart failure Mother        No details   Anemia Father    Breast cancer Sister    Cancer Other        colon/endometrial   Hypertension Other        family hx   Arthritis Other        family hx   Diabetes Other        family hx   Colon cancer Neg Hx    Esophageal cancer Neg Hx    Stomach cancer Neg Hx    Rectal cancer Neg Hx     Social History Social History   Tobacco Use   Smoking status: Never   Smokeless tobacco: Never  Vaping Use   Vaping Use: Never used  Substance Use Topics   Alcohol use: No    Alcohol/week: 0.0 standard drinks of alcohol   Drug use: No     Allergies   Ace inhibitors, Hydrocodone, Hydrocodone-acetaminophen, and Oxycodone   Review of Systems Review of Systems  Constitutional:  Negative for fever.  Musculoskeletal:  Positive for gait problem and joint swelling.     Physical Exam Triage Vital Signs ED Triage Vitals  Enc Vitals Group     BP 10/01/22 0846 (!) 180/87     Pulse Rate 10/01/22 0846 92     Resp 10/01/22 0846 16     Temp 10/01/22 0846 98 F (36.7 C)     Temp Source 10/01/22 0846 Oral     SpO2 10/01/22 0846 96 %     Weight 10/01/22 0847 218 lb 4.1 oz (99 kg)     Height 10/01/22 0847 5\' 2"  (1.575 m)     Head Circumference --      Peak Flow --      Pain Score 10/01/22 0847 10     Pain Loc --      Pain Edu? --      Excl. in GC? --    No data found.  Updated Vital Signs BP (!) 180/87 (BP Location: Right Arm)   Pulse 92   Temp 98 F (36.7 C) (Oral)   Resp 16   Ht 5\' 2"  (1.575 m)   Wt 218 lb 4.1 oz (99 kg)   LMP  (LMP Unknown)   SpO2 96%   BMI 39.92 kg/m   Visual Acuity Right  Eye Distance:   Left Eye Distance:   Bilateral Distance:    Right  Eye Near:   Left Eye Near:    Bilateral Near:     Physical Exam Vitals and nursing note reviewed.  Constitutional:      Appearance: Normal appearance.  HENT:     Head: Normocephalic and atraumatic.     Right Ear: External ear normal.     Left Ear: External ear normal.     Nose: Nose normal.     Mouth/Throat:     Mouth: Mucous membranes are moist.  Eyes:     Conjunctiva/sclera: Conjunctivae normal.  Cardiovascular:     Rate and Rhythm: Normal rate.     Pulses:          Dorsalis pedis pulses are 2+ on the left side.       Posterior tibial pulses are 2+ on the left side.  Pulmonary:     Effort: Pulmonary effort is normal. No respiratory distress.  Musculoskeletal:        General: Swelling and tenderness present. No deformity or signs of injury. Normal range of motion.     Right lower leg: No edema.     Left lower leg: No edema.       Feet:  Feet:     Left foot:     Skin integrity: Erythema and warmth present.     Comments: Erythema around left great toe with warmth and tenderness. No wounds, ulcers, or breaks in skin. Neurovascularly intact. Brisk capillary refill.  Skin:    General: Skin is warm and dry.     Capillary Refill: Capillary refill takes less than 2 seconds.     Findings: Erythema present.  Neurological:     General: No focal deficit present.     Mental Status: She is alert.  Psychiatric:        Mood and Affect: Mood normal.        Behavior: Behavior is cooperative.      UC Treatments / Results  Labs (all labs ordered are listed, but only abnormal results are displayed) Labs Reviewed - No data to display  EKG   Radiology No results found.  Procedures Procedures (including critical care time)  Medications Ordered in UC Medications  triamcinolone acetonide (KENALOG-40) injection 40 mg (has no administration in time range)    Initial Impression / Assessment and Plan / UC Course  I have reviewed the triage vital signs and the nursing  notes.  Pertinent labs & imaging results that were available during my care of the patient were reviewed by me and considered in my medical decision making (see chart for details).  Vitals and triage reviewed, patient is hemodynamically stable.  Has erythema, warmth and tenderness to left great toe that started last night.  History of gout, recent salmon intake.  Denies traumas, falls or wounds.  Neurovascularly intact with brisk capillary refill and sensation.  Patient does have diminished GFR, will cover for acute gout flare with oral glucocorticoids.  No history of diabetes, recent A1c was 6.1 on 08/09/22. IM triamcinolone given in clinic, sent home on taper pack.  Plan of care, follow-up care and return precautions given, no questions at this time.     Final Clinical Impressions(s) / UC Diagnoses   Final diagnoses:  Acute gout of left foot, unspecified cause     Discharge Instructions      You are having a gout flare in your left foot. Please take the steroid  dose pack as prescribed, take in the mornings with breakfast.  I have attached information for a low purine eating plan.  Follow-up with your primary care regarding your kidney function and allopurinol for gout prevention.  Please return to clinic for any new or concerning symptoms.     ED Prescriptions     Medication Sig Dispense Auth. Provider   predniSONE (STERAPRED UNI-PAK 21 TAB) 10 MG (21) TBPK tablet Take by mouth daily. Take as prescribed. 21 tablet Oleg Oleson, [place] N, [place]      PDMP not reviewed this encounter.   Rinaldo Ratel [place] N, [place] 10/01/22 [phone]

## 2022-10-03 NOTE — Progress Notes (Signed)
[facility] [address] [place] [address] Phone: [phone] Subjective:   Kiara Ramirez, am serving as a scribe for Dr. Antoine Primas.  I'm seeing this patient by the request  of:  Nelwyn Salisbury, MD  CC: Left knee pain, shoulder pain follow-up  [id number]  08/20/2022 Patient's pain seems to be more of a trigger point in the left shoulder girdle area. Patient declined an injection. Differential includes more of a cervical radiculopathy. Has x-ray showing fairly significant arthritic changes. Discussed with potential for the MRI and injection but at this moment patient also declined that. He would like to work with scapular exercises first. Increase activity slowly otherwise. 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  Instability with valgus and varus force.  Patient is ambulatory discussed icing regimen and home exercises, which activities to do more things to avoid, increase activity slowly over the course of next several weeks.  Follow-up again in 6 to 8 weeks.  Patient declined formal physical therapy.  Declined the possibility of injections today.   Updated 10/08/2022 Kiara Ramirez is a 72 y.o. female coming in with complaint of L shoulder and knee. Patient said that her shoulder pain is improving. Went on a trip and she was able to sit in car without pain.   Meeting Ryan today to be fitted for custom brace.   Patient recently seen in UC for gout in L great toe.       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 Russella Dar, incomplete exam     COLONOSCOPY  02/05/2022   Cologuard test was negative   CYSTOSCOPY  04/30/2018    Procedure: CYSTOSCOPY;  Surgeon: Osborn Coho, MD;  Location: [facility];  Service: Gynecology;;   DG BARIUM ENEMA ([facility] HX)  01/18/2019   per Dr. Russella Dar, incomplete exam, get CT colonography in 5 years    HYSTERECTOMY ABDOMINAL WITH SALPINGO-OOPHORECTOMY Bilateral 04/30/2018   Procedure: TOTAL ABDOMINAL HYSTERECTOMY WITH BILATERAL SALPINGO-OOPHORECTOMY;  Surgeon: Osborn Coho, MD;  Location: [facility];  Service: Gynecology;  Laterality: Bilateral;   LAPAROSCOPY  04/30/2018   Procedure: LAPAROSCOPY DIAGNOSTIC;  Surgeon: Osborn Coho, MD;  Location: [facility];  Service: Gynecology;;   TUBAL LIGATION     Social History   Socioeconomic History   Marital status: Married    Spouse name: Not on file   Number of children: 2   Years of education: 13   Highest education level: Not on file  Occupational History    Employer: [employer]  Tobacco Use   Smoking status: Never   Smokeless tobacco: Never  Vaping Use   Vaping Use: Never used  Substance and Sexual Activity   Alcohol use: No    Alcohol/week: 0.0 standard drinks of alcohol   Drug use: No   Sexual activity: Yes    Birth control/protection: Post-menopausal  Other Topics Concern   Not on file  Social History Narrative   Lives with husband in a one story home.  Has 2 children.   69 year old grandson lives with her.  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     Education: one year of college.    Social Determinants of Health   Financial Resource Strain: Not on file  Food Insecurity: Not on file  Transportation Needs: Not on file  Physical Activity: Not on file  Stress: Not on file  Social Connections: Not on file   Allergies  Allergen Reactions   Ace Inhibitors Swelling   Hydrocodone    Hydrocodone-Acetaminophen Nausea Only   Oxycodone Nausea And Vomiting    Headache   Family History  Problem Relation Age of Onset   Heart failure Mother        No details   Anemia Father    Breast cancer Sister    Cancer Other         colon/endometrial   Hypertension Other        family hx   Arthritis Other        family hx   Diabetes Other        family hx   Colon cancer Neg Hx    Esophageal cancer Neg Hx    Stomach cancer Neg Hx    Rectal cancer Neg Hx     Current Outpatient Medications (Endocrine & Metabolic):    predniSONE (STERAPRED UNI-PAK 21 TAB) 10 MG (21) TBPK tablet, Take by mouth daily. Take as prescribed.  Current Outpatient Medications (Cardiovascular):    hydrochlorothiazide (HYDRODIURIL) 25 MG tablet, Take 1 tablet (25 mg total) by mouth daily.   Current Outpatient Medications (Analgesics):    allopurinol (ZYLOPRIM) 100 MG tablet, Take 1 tablet (100 mg total) by mouth daily.   aspirin 81 MG tablet, Take 81 mg by mouth daily.   meloxicam (MOBIC) 15 MG tablet, Take 1 tablet (15 mg total) by mouth daily.   Current Outpatient Medications (Other):    clobetasol ointment (TEMOVATE) 0.05 %, clobetasol 0.05 % topical ointment  APPLY A THIN LAYER TO THE AFFECTED AREA TWICE DAILY FOR 2 WEEKS   famotidine (PEPCID) 40 MG tablet, Take 1 tablet (40 mg total) by mouth at bedtime.   lidocaine (XYLOCAINE) 2 % solution, Use as directed 15 mLs in the mouth or throat as needed for mouth pain.   omeprazole (PRILOSEC) 40 MG capsule, Take 1 capsule (40 mg total) by mouth in the morning.   potassium chloride (KLOR-CON) 10 MEQ tablet, Take 2 tablets (20 mEq total) by mouth 2 (two) times daily.    Review of Systems:  No headache, visual changes, nausea, vomiting, diarrhea, constipation, dizziness, abdominal pain, skin rash, fevers, chills, night sweats, weight loss, swollen lymph nodes, chest pain, shortness of breath, mood changes. POSITIVE muscle aches, joint swelling, body aches  Objective  Blood pressure 120/82, pulse 84, height 5\' 2"  (1.575 m), weight 212 lb (96.2 kg), SpO2 96 %.   General: No apparent distress alert and oriented x3 mood and affect normal, dressed appropriately.  HEENT: Pupils equal,  extraocular movements intact  Respiratory: Patient's speak in full sentences and does not appear short of breath  Cardiovascular: No lower extremity edema, non tender, no erythema  Antalgic gait noted.  Left knee does not have any significant swelling.  Moderately tender to palpation over the medial joint line. Left shoulder significant improvement in range of motion.  Significant less tenderness than previous exam.    Impression and Recommendations:    The above documentation has been reviewed and is accurate and complete Judi Saa, DO

## 2022-10-08 ENCOUNTER — Other Ambulatory Visit: Payer: Self-pay

## 2022-10-08 ENCOUNTER — Ambulatory Visit (INDEPENDENT_AMBULATORY_CARE_PROVIDER_SITE_OTHER): Payer: PPO | Admitting: Family Medicine

## 2022-10-08 VITALS — BP 120/82 | HR 84 | Ht 62.0 in | Wt 212.0 lb

## 2022-10-08 DIAGNOSIS — M1712 Unilateral primary osteoarthritis, left knee: Secondary | ICD-10-CM | POA: Diagnosis not present

## 2022-10-08 DIAGNOSIS — M109 Gout, unspecified: Secondary | ICD-10-CM | POA: Diagnosis not present

## 2022-10-08 DIAGNOSIS — G8929 Other chronic pain: Secondary | ICD-10-CM

## 2022-10-08 DIAGNOSIS — M25512 Pain in left shoulder: Secondary | ICD-10-CM | POA: Diagnosis not present

## 2022-10-08 NOTE — Patient Instructions (Addendum)
Discontinue Meloxicam Good to see you Let's see how brace does See me in 7-8 weeks

## 2022-10-08 NOTE — Assessment & Plan Note (Signed)
Significant improvement after the exercises at this point.  Hold on any type of injection.

## 2022-10-08 NOTE — Assessment & Plan Note (Addendum)
Discussed tart cherry with patient not continuing the allopurinol.  Patient is going to have her kidneys rechecked by primary care and we will see if she can restart this or if she would be a candidate for [employer]

## 2022-10-08 NOTE — Assessment & Plan Note (Addendum)
Patient does have bone-on-bone arthritis but hopefully the brace will help with the instability.  Patient was to hold on any injections at this time and will follow-up with me again in 2 to 3 months.

## 2022-10-10 ENCOUNTER — Ambulatory Visit (INDEPENDENT_AMBULATORY_CARE_PROVIDER_SITE_OTHER): Payer: PPO | Admitting: Family Medicine

## 2022-10-10 ENCOUNTER — Encounter: Payer: Self-pay | Admitting: Family Medicine

## 2022-10-10 VITALS — BP 124/80 | HR 81 | Temp 98.2°F | Wt 211.0 lb

## 2022-10-10 DIAGNOSIS — N1832 Chronic kidney disease, stage 3b: Secondary | ICD-10-CM | POA: Diagnosis not present

## 2022-10-10 DIAGNOSIS — M109 Gout, unspecified: Secondary | ICD-10-CM | POA: Diagnosis not present

## 2022-10-10 LAB — BASIC METABOLIC PANEL
BUN: 20 mg/dL (ref 6–23)
CO2: 30 mEq/L (ref 19–32)
Calcium: 9 mg/dL (ref 8.4–10.5)
Chloride: 104 mEq/L (ref 96–112)
Creatinine, Ser: 1.29 mg/dL — ABNORMAL HIGH (ref 0.40–1.20)
GFR: 41.93 mL/min — ABNORMAL LOW (ref >60.00)
Glucose, Bld: 89 mg/dL (ref 70–99)
Potassium: 4 mEq/L (ref 3.5–5.1)
Sodium: 141 mEq/L (ref 135–145)

## 2022-10-10 MED ORDER — METHYLPREDNISOLONE 4 MG PO TBPK: ORAL_TABLET | ORAL | 0 refills | Status: DC

## 2022-10-10 NOTE — Progress Notes (Signed)
   Subjective:    Patient ID: Kiara Ramirez, female    DOB: 1950/12/21, 72 y.o.   MRN: [id number]  HPI Here to follow up on an urgent care visit on 10-01-22 for a gout flare in her left great toe. She was given a shot of Triamcinolone and then a Sterapred UniPack. Her toe is much better now but still has some pain. She has started taking tart cherry extract OTC.    Review of Systems  Constitutional: Negative.   Respiratory: Negative.    Cardiovascular: Negative.   Musculoskeletal:  Positive for arthralgias.       Objective:   Physical Exam Constitutional:      General: She is not in acute distress.    Appearance: Normal appearance.  Cardiovascular:     Rate and Rhythm: Normal rate and regular rhythm.     Pulses: Normal pulses.     Heart sounds: Normal heart sounds.  Pulmonary:     Effort: Pulmonary effort is normal.     Breath sounds: Normal breath sounds.  Musculoskeletal:     Comments: Left great toe is swollen and tender at the MTP joint   Neurological:     Mental Status: She is alert.           Assessment & Plan:  Gout, partially treated. We will give her a Medrol dose pack. Follow up as needed.  Gershon Crane, MD

## 2022-11-18 ENCOUNTER — Ambulatory Visit (HOSPITAL_COMMUNITY)
Admission: EM | Admit: 2022-11-18 | Discharge: 2022-11-18 | Disposition: A | Discharge status: Home / Self Care | Payer: PPO | Attending: Physician Assistant | Admitting: Physician Assistant

## 2022-11-18 ENCOUNTER — Other Ambulatory Visit: Payer: Self-pay

## 2022-11-18 ENCOUNTER — Encounter (HOSPITAL_COMMUNITY): Payer: Self-pay | Admitting: Emergency Medicine

## 2022-11-18 ENCOUNTER — Ambulatory Visit (INDEPENDENT_AMBULATORY_CARE_PROVIDER_SITE_OTHER): Payer: PPO

## 2022-11-18 ENCOUNTER — Ambulatory Visit: Payer: PPO | Admitting: Family

## 2022-11-18 DIAGNOSIS — N1832 Chronic kidney disease, stage 3b: Secondary | ICD-10-CM

## 2022-11-18 DIAGNOSIS — M25572 Pain in left ankle and joints of left foot: Secondary | ICD-10-CM | POA: Diagnosis not present

## 2022-11-18 DIAGNOSIS — M2012 Hallux valgus (acquired), left foot: Secondary | ICD-10-CM | POA: Diagnosis not present

## 2022-11-18 DIAGNOSIS — M109 Gout, unspecified: Secondary | ICD-10-CM | POA: Diagnosis present

## 2022-11-18 DIAGNOSIS — M19072 Primary osteoarthritis, left ankle and foot: Secondary | ICD-10-CM | POA: Diagnosis not present

## 2022-11-18 DIAGNOSIS — M7732 Calcaneal spur, left foot: Secondary | ICD-10-CM | POA: Diagnosis not present

## 2022-11-18 LAB — CBC WITH DIFFERENTIAL/PLATELET
Abs Immature Granulocytes: 0.04 10*3/uL (ref 0.00–0.07)
Basophils Absolute: 0 10*3/uL (ref 0.0–0.1)
Basophils Relative: 0 %
Eosinophils Absolute: 0 10*3/uL (ref 0.0–0.5)
Eosinophils Relative: 1 %
HCT: 39 % (ref 36.0–46.0)
Hemoglobin: 12.4 g/dL (ref 12.0–15.0)
Immature Granulocytes: 1 %
Lymphocytes Relative: 27 %
Lymphs Abs: 2.3 10*3/uL (ref 0.7–4.0)
MCH: 28.1 pg (ref 26.0–34.0)
MCHC: 31.8 g/dL (ref 30.0–36.0)
MCV: 88.2 fL (ref 80.0–100.0)
Monocytes Absolute: 0.4 10*3/uL (ref 0.1–1.0)
Monocytes Relative: 5 %
Neutro Abs: 5.6 10*3/uL (ref 1.7–7.7)
Neutrophils Relative %: 66 %
Platelets: 420 10*3/uL — ABNORMAL HIGH (ref 150–400)
RBC: 4.42 MIL/uL (ref 3.87–5.11)
RDW: 14.5 % (ref 11.5–15.5)
WBC: 8.5 10*3/uL (ref 4.0–10.5)
nRBC: 0 % (ref 0.0–0.2)

## 2022-11-18 LAB — BASIC METABOLIC PANEL
Anion gap: 11 (ref 5–15)
BUN: 13 mg/dL (ref 8–23)
CO2: 27 mmol/L (ref 22–32)
Calcium: 9.2 mg/dL (ref 8.9–10.3)
Chloride: 102 mmol/L (ref 98–111)
Creatinine, Ser: 1.04 mg/dL — ABNORMAL HIGH (ref 0.44–1.00)
GFR, Estimated: 57 mL/min — ABNORMAL LOW (ref >60)
Glucose, Bld: 84 mg/dL (ref 70–99)
Potassium: 4.2 mmol/L (ref 3.5–5.1)
Sodium: 140 mmol/L (ref 135–145)

## 2022-11-18 LAB — URIC ACID: Uric Acid, Serum: 6.5 mg/dL (ref 2.5–7.1)

## 2022-11-18 MED ORDER — COLCHICINE 0.6 MG PO TABS: ORAL_TABLET | ORAL | 0 refills | Status: DC

## 2022-11-18 NOTE — ED Provider Notes (Signed)
[facility]    CSN: [id number] Arrival date & time: 11/18/22  4098      History   Chief Complaint Chief Complaint  Patient presents with   Foot Pain    HPI Kiara Ramirez is a 72 y.o. female.   Patient presents today with a 1 week history of recurrent left foot pain.  She has a history of gout and was seen by our clinic on 10/01/2022 for which point she was given 40 mg of triamcinolone injection and prednisone taper.  This improved but did not resolve symptoms.  She was seen by her primary care on 10/10/2022 at which point she was given a Medrol Dosepak.  Again she had improvement but not resolution of symptoms.  She denies any dietary changes.  She was previously prescribed hydrochlorothiazide but discontinued this medication many months ago.  Denies any additional medication changes.  She denies alcohol consumption.  She has tried Tylenol with minimal improvement of symptoms.  She has never seen a podiatrist.  She does have stage III chronic kidney disease and wonders if this could be contributing to her symptoms.  She denies any known injury.  Denies any numbness or paresthesias.  Uric acid level was 8.2 on 08/10/2021.  She does not take any uric acid lowering medication such as allopurinol.    Past Medical History:  Diagnosis Date   Allergy    Anemia    Anxiety    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 meds   B12 deficiency    GERD (gastroesophageal reflux disease)    Gout    Hypertension     Patient Active Problem List   Diagnosis Date Noted   Stage 3b chronic kidney disease (HCC) 10/10/2022   Degenerative arthritis of left knee 08/20/2022   Trigger point of left shoulder region 08/20/2022   Hypokalemia 07/09/2022   GERD (gastroesophageal reflux disease) 06/09/2019   Gout 08/13/2018   Postmenopausal bleeding 04/30/2018   Post-menopausal bleeding 04/30/2018   Chronic pain of left knee 10/06/2017   HYPERTROPHY OF TONSILS ALONE 03/07/2008   Anxiety state 04/20/2007   Weakness  generalized 04/01/2007   B12 deficiency 02/25/2007   Essential hypertension 01/23/2007   Headache 01/23/2007    Past Surgical History:  Procedure Laterality Date   COLONOSCOPY  12/16/2018   per Dr. Russella Dar, incomplete exam     COLONOSCOPY  02/05/2022   Cologuard test was negative   CYSTOSCOPY  04/30/2018   Procedure: CYSTOSCOPY;  Surgeon: Osborn Coho, MD;  Location: [facility];  Service: Gynecology;;   DG BARIUM ENEMA ([facility] HX)  01/18/2019   per Dr. Russella Dar, incomplete exam, get CT colonography in 5 years    HYSTERECTOMY ABDOMINAL WITH SALPINGO-OOPHORECTOMY Bilateral 04/30/2018   Procedure: TOTAL ABDOMINAL HYSTERECTOMY WITH BILATERAL SALPINGO-OOPHORECTOMY;  Surgeon: Osborn Coho, MD;  Location: [facility];  Service: Gynecology;  Laterality: Bilateral;   LAPAROSCOPY  04/30/2018   Procedure: LAPAROSCOPY DIAGNOSTIC;  Surgeon: Osborn Coho, MD;  Location: [facility];  Service: Gynecology;;   TUBAL LIGATION      OB History   No obstetric history on file.      Home Medications    Prior to Admission medications   Medication Sig Start Date End Date Taking? Authorizing Provider  colchicine 0.6 MG tablet Take 1.2mg  (two tablets) day one and 0.6 mg (one tablet) days 2-5 then stop 11/18/22  Yes ,  K, PA-C  aspirin 81 MG tablet Take 81 mg by mouth daily.    [provider]  clobetasol ointment (TEMOVATE) 0.05 % clobetasol 0.05 % topical ointment  APPLY A THIN LAYER TO THE AFFECTED AREA TWICE DAILY FOR 2 WEEKS    [provider]  Cyanocobalamin (VITAMIN B-12 PO) Take 1,000 mcg by mouth daily.    [provider]  lidocaine (XYLOCAINE) 2 % solution Use as directed 15 mLs in the mouth or throat as needed for mouth pain. 03/30/21   White, Elita Boone, NP  potassium chloride (KLOR-CON) 10 MEQ tablet Take 2 tablets (20 mEq total) by mouth 2 (two) times daily. 07/03/22   Nelwyn Salisbury, MD    Family History Family History  Problem Relation Age of Onset   Heart  failure Mother        No details   Anemia Father    Breast cancer Sister    Cancer Other        colon/endometrial   Hypertension Other        family hx   Arthritis Other        family hx   Diabetes Other        family hx   Colon cancer Neg Hx    Esophageal cancer Neg Hx    Stomach cancer Neg Hx    Rectal cancer Neg Hx     Social History Social History   Tobacco Use   Smoking status: Never   Smokeless tobacco: Never  Vaping Use   Vaping status: Never Used  Substance Use Topics   Alcohol use: No    Alcohol/week: 0.0 standard drinks of alcohol   Drug use: No     Allergies   Ace inhibitors, Hydrocodone, Hydrocodone-acetaminophen, and Oxycodone   Review of Systems Review of Systems  Constitutional:  Positive for activity change. Negative for appetite change, fatigue and fever.  Gastrointestinal:  Negative for abdominal pain, diarrhea, nausea and vomiting.  Musculoskeletal:  Positive for arthralgias, gait problem and joint swelling. Negative for myalgias.  Skin:  Positive for color change. Negative for wound.     Physical Exam Triage Vital Signs ED Triage Vitals [11/18/22 1132]  Encounter Vitals Group     BP (!) 164/89     Systolic BP Percentile      Diastolic BP Percentile      Pulse Rate 86     Resp 16     Temp 98 F (36.7 C)     Temp Source Oral     SpO2 100 %     Weight      Height      Head Circumference      Peak Flow      Pain Score      Pain Loc      Pain Education      Exclude from Growth Chart    No data found.  Updated Vital Signs BP (!) 164/89 (BP Location: Right Arm)   Pulse 86   Temp 98 F (36.7 C) (Oral)   Resp 16   LMP  (LMP Unknown)   SpO2 100%   Visual Acuity Right Eye Distance:   Left Eye Distance:   Bilateral Distance:    Right Eye Near:   Left Eye Near:    Bilateral Near:     Physical Exam Vitals reviewed.  Constitutional:      General: She is awake. She is not in acute distress.    Appearance: Normal  appearance. She is well-developed. She is not ill-appearing.     Comments: Very pleasant female appears stated age in  no acute distress sitting comfortably in exam room  HENT:     Head: Normocephalic and atraumatic.  Cardiovascular:     Rate and Rhythm: Normal rate and regular rhythm.     Pulses:          Posterior tibial pulses are 2+ on the left side.     Heart sounds: Normal heart sounds, S1 normal and S2 normal. No murmur heard. Pulmonary:     Effort: Pulmonary effort is normal.     Breath sounds: Normal breath sounds. No wheezing, rhonchi or rales.     Comments: Clear to auscultation bilaterally Musculoskeletal:     Left foot: Normal range of motion. No deformity or bunion.       Feet:  Feet:     Left foot:     Protective Sensation: 10 sites tested.  10 sites sensed.     Skin integrity: Erythema and warmth present. No skin breakdown.     Toenail Condition: Left toenails are normal.     Comments: Erythema, swelling, tenderness noted left first MTP.  Foot neurovascularly intact.  Tenderness palpation over joint.  Normal gait. Psychiatric:        Behavior: Behavior is cooperative.      UC Treatments / Results  Labs (all labs ordered are listed, but only abnormal results are displayed) Labs Reviewed  BASIC METABOLIC PANEL  CBC WITH DIFFERENTIAL/PLATELET  URIC ACID    EKG   Radiology DG Foot Complete Left  Result Date: 11/18/2022 CLINICAL DATA:  First MTP joint pain and swelling.  History of gout EXAM: LEFT FOOT - COMPLETE 3+ VIEW COMPARISON:  11/09/2014 FINDINGS: There is no evidence of fracture or dislocation. Slight hallux valgus deformity. Mild-moderate degenerative changes at the first MTP joint and hallux sesamoid complex, only minimally progressed since 2016. Remaining joint spaces are relatively well preserved. No erosions. Small plantar calcaneal spur. Os peroneum. Soft tissues are unremarkable. IMPRESSION: Mild-moderate degenerative changes at the first MTP joint  and hallux sesamoid complex, only minimally progressed since 2016. Electronically Signed   By: Duanne Guess D.O.   On: 11/18/2022 12:47    Procedures Procedures (including critical care time)  Medications Ordered in UC Medications - No data to display  Initial Impression / Assessment and Plan / UC Course  I have reviewed the triage vital signs and the nursing notes.  Pertinent labs & imaging results that were available during my care of the patient were reviewed by me and considered in my medical decision making (see chart for details).     Patient is well-appearing, afebrile, nontoxic, nontachycardic.  X-ray was obtained given prolonged and worsening symptoms that show degenerative changes without acute abnormality.  Given her clinical presentation I am most suspicious for recurrent gout flare.  This might be triggered by her chronic kidney disease.  We discussed potential utility of prophylactic medication such as allopurinol that can be started with her primary care once acute flare has been resolved.  Given she has taken multiple courses of steroids that have been ineffective we will try colchicine.  Her last metabolic panel from 10/10/2022 showed a creatinine of 1.29 and calculated creatinine clearance of 60.44 mL/min indicating no dose adjustment for colchicine.  Basic labs including CBC, CMP, uric acid level were obtained and are pending.  She was encouraged to follow-up with podiatry given her ongoing symptoms that she may benefit from intra-articular steroid injection.  She was given contact information for local provider with instruction to call to schedule appointment.  We discussed that if she has any worsening or changing symptoms including increasing pain, swelling, fever, nausea, vomiting she needs to be seen immediately.  Strict return precautions given to which she expressed understanding.  Discussed case with Dr. Marlinda Mike who agreed with treatment plan.  Final Clinical  Impressions(s) / UC Diagnoses   Final diagnoses:  Acute gout involving toe of left foot, unspecified cause  Stage 3b chronic kidney disease (HCC)     Discharge Instructions      Your x-ray showed arthritis but otherwise there was nothing abnormal.  Since you have had multiple doses of prednisone and that has not managed your symptoms we are going to try colchicine.  Please take 2 tablets today and 1 tablet days 2 through 5.  I will contact you if any of your lab work is abnormal.  Follow-up with podiatry as soon as possible.  Call them to schedule an appointment.  If anything worsens you have increasing pain, fever, nausea, vomiting you need to be seen immediately.     ED Prescriptions     Medication Sig Dispense Auth. Provider   colchicine 0.6 MG tablet Take 1.2mg  (two tablets) day one and 0.6 mg (one tablet) days 2-5 then stop 6 tablet ,  K, PA-C      PDMP not reviewed this encounter.   Jeani Hawking, PA-C 11/18/22 1326

## 2022-11-18 NOTE — ED Triage Notes (Signed)
Patient presents to [facility] for evaluation of left foot pain x 1 week, history of gout to left big toe, has been trying to get it fully resolved for over a month now.

## 2022-11-18 NOTE — Discharge Instructions (Signed)
Your x-ray showed arthritis but otherwise there was nothing abnormal.  Since you have had multiple doses of prednisone and that has not managed your symptoms we are going to try colchicine.  Please take 2 tablets today and 1 tablet days 2 through 5.  I will contact you if any of your lab work is abnormal.  Follow-up with podiatry as soon as possible.  Call them to schedule an appointment.  If anything worsens you have increasing pain, fever, nausea, vomiting you need to be seen immediately.

## 2022-11-25 NOTE — Progress Notes (Unsigned)
[name] [address] [place] [address] Phone: [phone] Subjective:   INadine Counts, am serving as a scribe for Dr. Antoine Primas.  I'm seeing this patient by the request  of:  Nelwyn Salisbury, MD  CC: left knee pain follow up   [id number]  10/08/2022 Patient does have bone-on-bone arthritis but hopefully the brace will help with the instability.  Patient was to hold on any injections at this time and will follow-up with me again in 2 to 3 months.      Significant improvement after the exercises at this point.  Hold on any type of injection.     Discussed tart cherry with patient not continuing the allopurinol. Patient is going to have her kidneys rechecked by primary care and we will see if she can restart this or if she would be a candidate for Uloric   Update 11/26/2022 Kiara Ramirez is a 72 y.o. Ramirez coming in with complaint of L knee and L shoulder pain. Patient states sitting about the same place with knee pain. Brace is not really working out. Not fitting properly.      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 Russella Dar, incomplete exam     COLONOSCOPY  02/05/2022   Cologuard test was negative   CYSTOSCOPY  04/30/2018   Procedure: CYSTOSCOPY;  Surgeon: Osborn Coho, MD;  Location: [facility];  Service: Gynecology;;   DG BARIUM ENEMA ([facility] HX)  01/18/2019   per Dr. Russella Dar, incomplete exam, get CT colonography in 5 years    HYSTERECTOMY ABDOMINAL WITH SALPINGO-OOPHORECTOMY Bilateral 04/30/2018   Procedure: TOTAL ABDOMINAL HYSTERECTOMY WITH BILATERAL SALPINGO-OOPHORECTOMY;  Surgeon: Osborn Coho, MD;  Location: [facility];  Service: Gynecology;  Laterality: Bilateral;   LAPAROSCOPY  04/30/2018    Procedure: LAPAROSCOPY DIAGNOSTIC;  Surgeon: Osborn Coho, MD;  Location: [facility];  Service: Gynecology;;   TUBAL LIGATION     Social History   Socioeconomic History   Marital status: Married    Spouse name: Not on file   Number of children: 2   Years of education: 13   Highest education level: Not on file  Occupational History    Employer: [employer]  Tobacco Use   Smoking status: Never   Smokeless tobacco: Never  Vaping Use   Vaping status: Never Used  Substance and Sexual Activity   Alcohol use: No    Alcohol/week: 0.0 standard drinks of alcohol   Drug use: No   Sexual activity: Yes    Birth control/protection: Post-menopausal  Other Topics Concern   Not on file  Social History Narrative   Lives with husband in a one story home.  Has 2 children.   96 year old grandson lives with her.    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     Education: one year of college.    Social Determinants of Health   Financial Resource Strain: Not on file  Food Insecurity: Not on file  Transportation Needs: Not on file  Physical Activity: Not on file  Stress: Not on file  Social Connections: Not on file   Allergies  Allergen Reactions   Ace Inhibitors Swelling   Hydrocodone    Hydrocodone-Acetaminophen Nausea Only   Oxycodone Nausea And Vomiting    Headache   Family History  Problem Relation Age of Onset   Heart failure Mother        No details   Anemia Father    Breast cancer Sister    Cancer Other        colon/endometrial   Hypertension Other        family hx   Arthritis Other        family hx   Diabetes Other        family hx   Colon cancer Neg Hx    Esophageal cancer Neg Hx    Stomach cancer Neg Hx    Rectal cancer Neg Hx        Current Outpatient Medications (Analgesics):    aspirin 81 MG tablet, Take 81 mg by mouth daily.   colchicine 0.6 MG tablet, Take 1.2mg  (two tablets) day one and 0.6 mg (one tablet) days 2-5 then stop  Current Outpatient  Medications (Hematological):    Cyanocobalamin (VITAMIN B-12 PO), Take 1,000 mcg by mouth daily.  Current Outpatient Medications (Other):    clobetasol ointment (TEMOVATE) 0.05 %, clobetasol 0.05 % topical ointment  APPLY A THIN LAYER TO THE AFFECTED AREA TWICE DAILY FOR 2 WEEKS   lidocaine (XYLOCAINE) 2 % solution, Use as directed 15 mLs in the mouth or throat as needed for mouth pain.   potassium chloride (KLOR-CON) 10 MEQ tablet, Take 2 tablets (20 mEq total) by mouth 2 (two) times daily.   Reviewed prior external information including notes and imaging from  primary care provider As well as notes that were available from care everywhere and other healthcare systems.  Past medical history, social, surgical and family history all reviewed in electronic medical record.  No pertanent information unless stated regarding to the chief complaint.   Review of Systems:  No headache, visual changes, nausea, vomiting, diarrhea, constipation, dizziness, abdominal pain, skin rash, fevers, chills, night sweats, weight loss, swollen lymph nodes, body aches,  chest pain, shortness of breath, mood changes. POSITIVE muscle aches, joint swelling  Objective  Blood pressure 124/86, pulse 79, height 5\' 2"  (1.575 m), weight 205 lb (93 kg).   General: No apparent distress alert and oriented x3 mood and affect normal, dressed appropriately.  HEENT: Pupils equal, extraocular movements intact  Respiratory: Patient's speak in full sentences and does not appear short of breath  Cardiovascular: No lower extremity edema, non tender, no erythema  Antalgic gait still favoring the left knee.  Does have some crepitus noted.  Some mild instability with valgus and varus force noted.    Impression and Recommendations:    The above documentation has been reviewed and is accurate and complete Judi Saa, DO

## 2022-11-26 ENCOUNTER — Encounter: Payer: Self-pay | Admitting: Family Medicine

## 2022-11-26 ENCOUNTER — Ambulatory Visit (INDEPENDENT_AMBULATORY_CARE_PROVIDER_SITE_OTHER): Payer: PPO | Admitting: Family Medicine

## 2022-11-26 VITALS — BP 124/86 | HR 79 | Ht 62.0 in | Wt 205.0 lb

## 2022-11-26 DIAGNOSIS — M1712 Unilateral primary osteoarthritis, left knee: Secondary | ICD-10-CM | POA: Diagnosis not present

## 2022-11-26 NOTE — Patient Instructions (Signed)
Read on Gel and PRP Contact Ryan about brace when you have time See you again in 2 months

## 2022-11-26 NOTE — Assessment & Plan Note (Signed)
Arthritic changes noted.  Discussed icing regimen exercises.  Discussed which activities to do and which ones to avoid.  Increase activity slowly.  Follow-up again in 6 to 8 weeks.  Discussed the possibility of viscosupplementation and PRP as well.

## 2022-11-28 IMAGING — MR MR CERVICAL SPINE W/O CM
4 of 5 series · 29 of 48 positions shown · non-contrast
Comparison: Cervical radiographs 05/15/2020.

CLINICAL DATA: Neck pain, chronic, degenerative changes on xray

EXAM:
MRI CERVICAL SPINE WITHOUT CONTRAST
TECHNIQUE: Multiplanar, multisequence MR imaging of the cervical spine was
performed. No intravenous contrast was administered.

[Series 5: T2 · sagittal · 3.0mm · 0.55mm/px · 8 of 16 slices shown (1 of 2)]
[im 1/16]
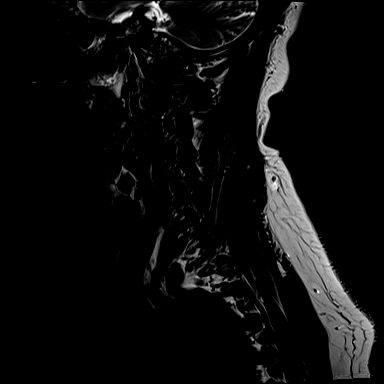
[im 3/16]
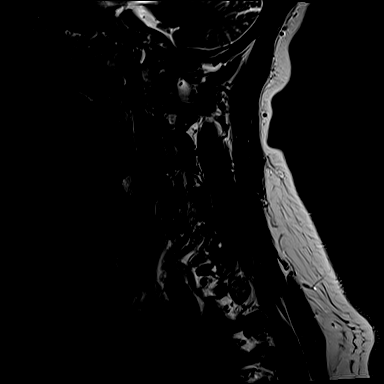
[im 5/16]
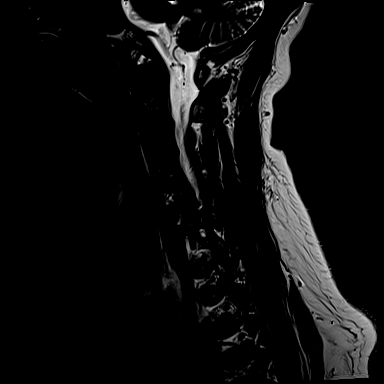
[im 7/16]
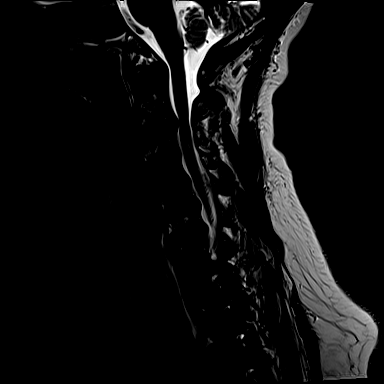
[im 9/16]
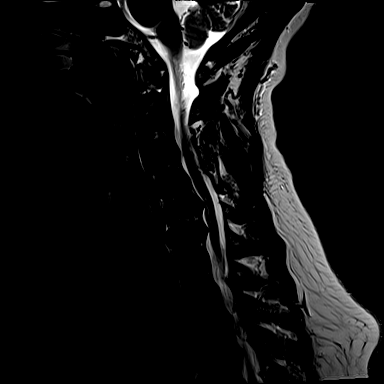
[im 11/16]
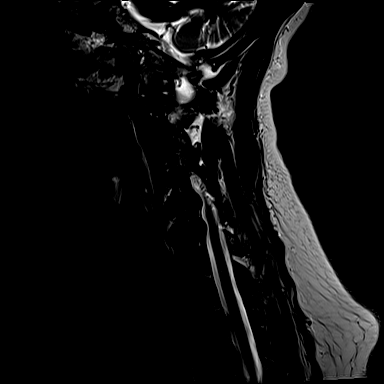
[im 13/16]
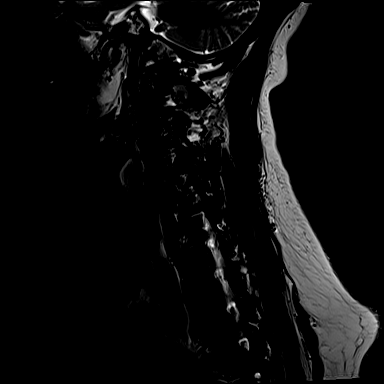
[im 16/16]
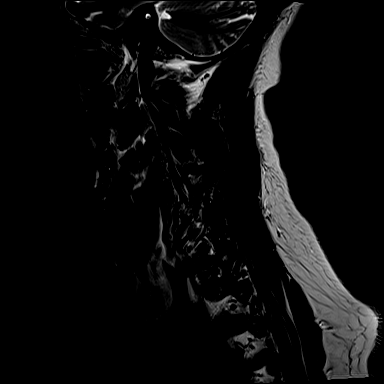

[Series 6: T1 · sagittal · 3.0mm · 0.66mm/px · 7 of 16 slices shown]
[im 1/16]
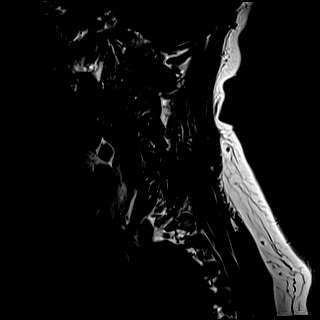
[im 3/16]
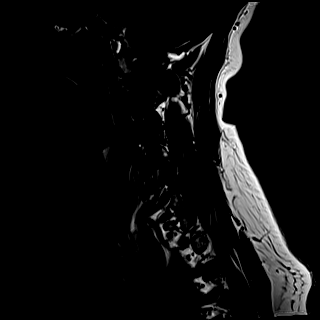
[im 6/16]
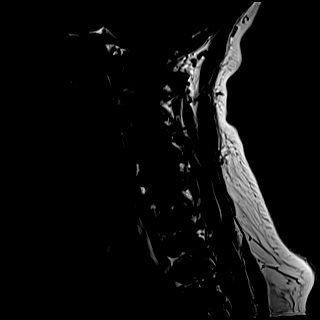
[im 8/16]
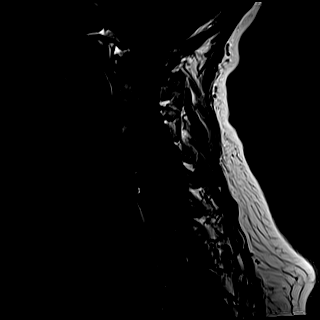
[im 11/16]
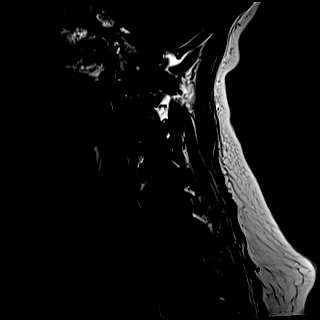
[im 13/16]
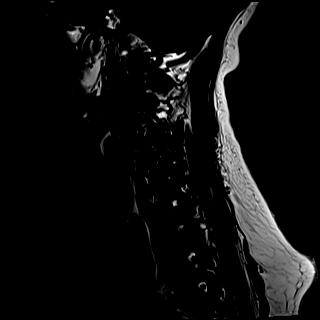
[im 16/16]
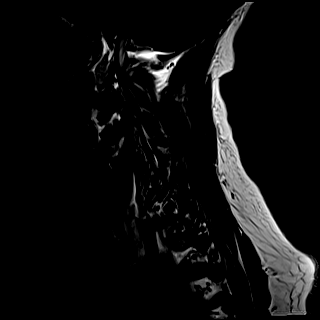

[Series 7: STIR · sagittal · 3.0mm · 0.33mm/px · 5 of 16 slices shown]
[im 1/16]
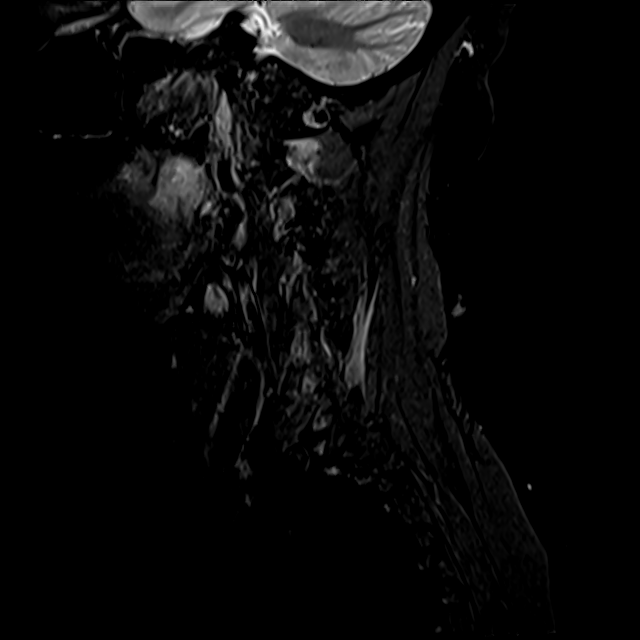
[im 3/16]
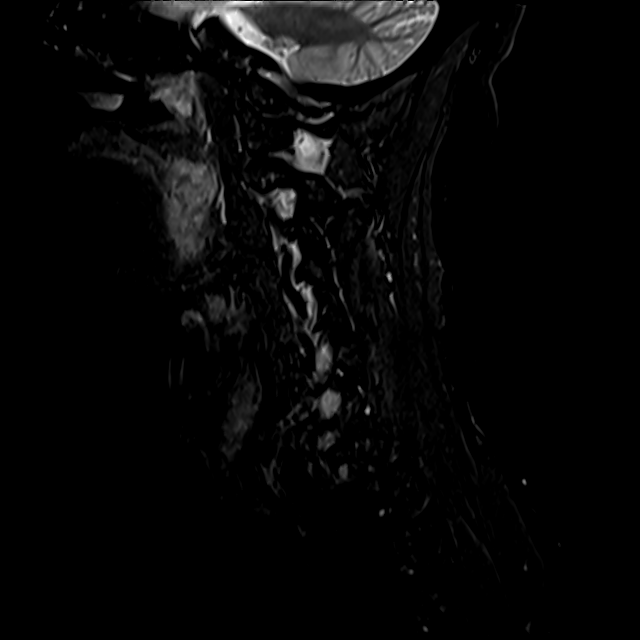
[im 6/16]
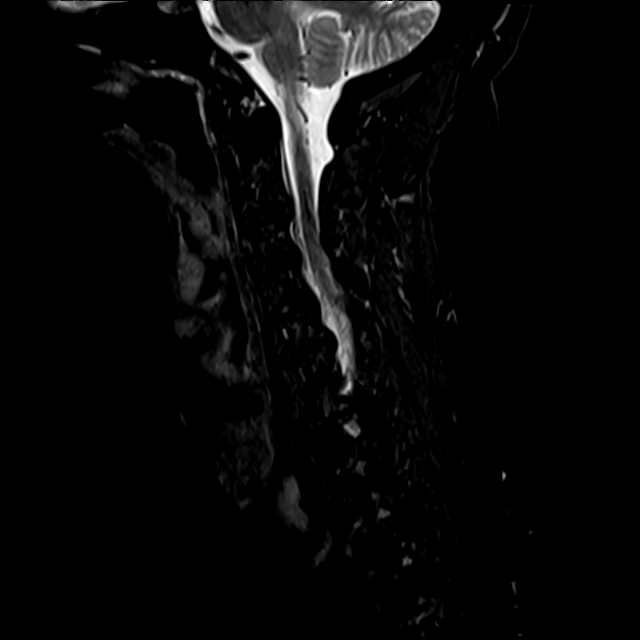
[im 8/16]
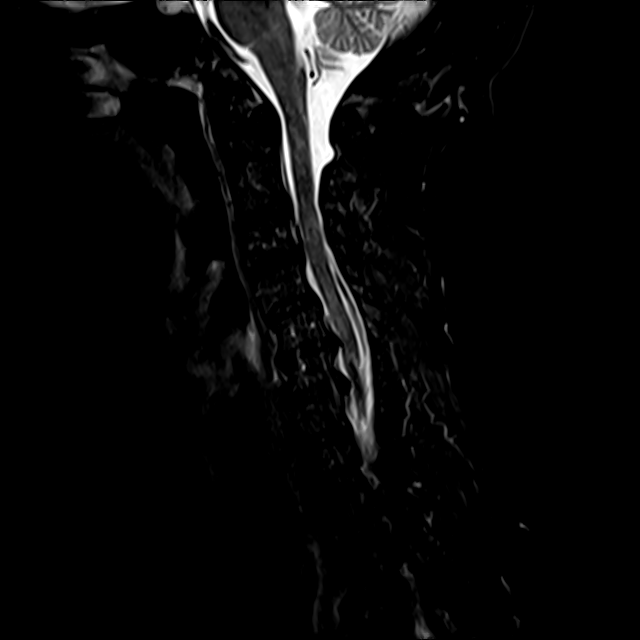
[im 13/16]
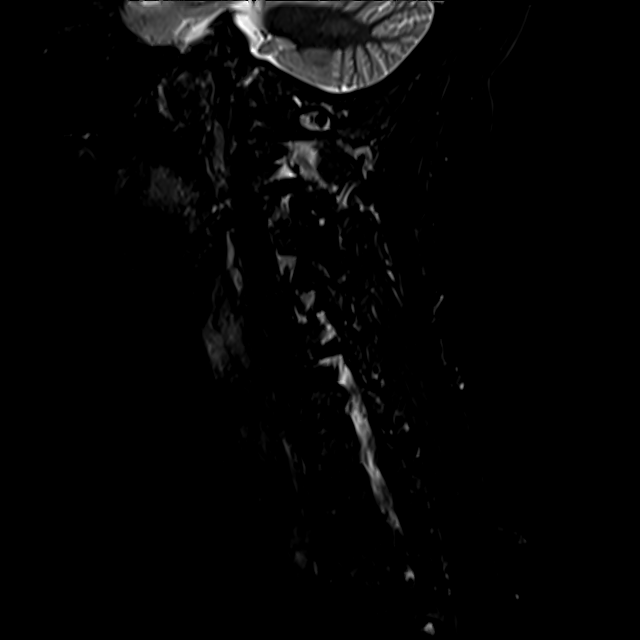

[Series 8: T2 · axial · 3.0mm · 0.50mm/px · z∈[-30,+62]mm · 9 of 30 slices shown (2 of 2)]
[im 1/30]
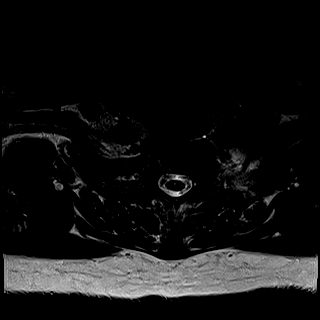
[im 5/30]
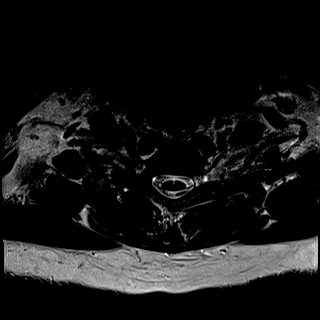
[im 10/30]
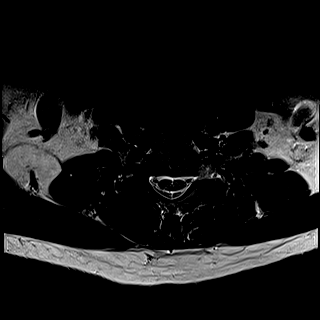
[im 13/30]
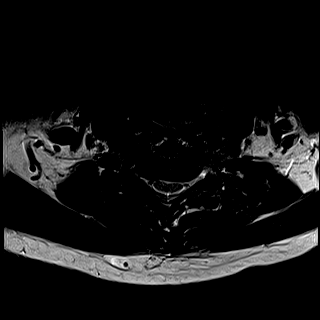
[im 15/30]
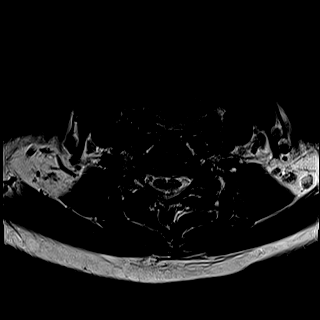
[im 17/30]
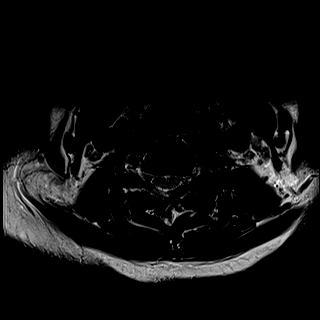
[im 20/30]
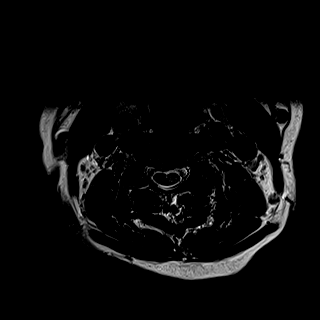
[im 25/30]
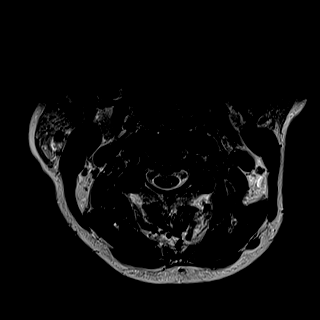
[im 30/30]
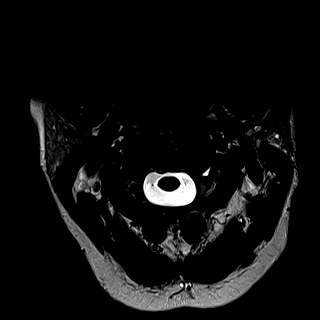

[29 of 48 positions shown; findings below may reference images not displayed]

FINDINGS: Alignment: Straightening of the normal cervical lordosis. No
substantial sagittal subluxation.

Vertebrae: Vertebral body heights are maintained. No focal marrow
edema to suggest acute fracture or discitis/osteomyelitis.

Cord: Normal cord signal.

Posterior Fossa, vertebral arteries, paraspinal tissues: Visualized
vertebral artery flow voids are maintained. No acute findings in the
visualized posterior fossa.

Disc levels:

C2-C3: Right paracentral disc protrusion contacts and flattens the
ventral cord with overall mild canal stenosis. Patent foramina.

C3-C4: Posterior disc osteophyte complex with left greater than
right facet and uncovertebral hypertrophy. Resulting mild to
moderate left and mild right foraminal stenosis with mild canal
stenosis.

C4-C5: Posterior disc osteophyte complex with left greater than
right facet and uncovertebral hypertrophy. Resulting mild to
moderate left foraminal stenosis. Patent canal and right foramen.

C5-C6: Posterior disc osteophyte complex with right greater than
left facet and uncovertebral hypertrophy. Posterior disc contacts
and flattens the ventral cord with patent canal. Resulting moderate
right and mild left foraminal stenosis.

C6-C7: Right eccentric posterior disc/osteophyte complex with mild
right foraminal stenosis. Patent canal and left foramen.

C7-T1: No significant disc protrusion, foraminal stenosis, or canal
stenosis.

Partially imaged disc protrusion at T2-T3 which effaces ventral CSF
without significant canal stenosis.
IMPRESSION: 1. Moderate right foraminal stenosis at C5-C6. Mild-to-moderate left
foraminal stenosis C3-C4 and C4-C5. Multilevel mild foraminal
stenosis is detailed above.
2. Mild canal stenosis at C2-C3 and C3-C4. Posterior disc bulges
contact the ventral cord at multiple levels, detailed above.

## 2022-12-09 ENCOUNTER — Encounter: Payer: Self-pay | Admitting: Family Medicine

## 2022-12-09 ENCOUNTER — Ambulatory Visit (INDEPENDENT_AMBULATORY_CARE_PROVIDER_SITE_OTHER): Payer: PPO | Admitting: Family Medicine

## 2022-12-09 VITALS — BP 130/80 | HR 84 | Temp 98.4°F | Wt 205.0 lb

## 2022-12-09 DIAGNOSIS — M109 Gout, unspecified: Secondary | ICD-10-CM

## 2022-12-09 MED ORDER — COLCHICINE 0.6 MG PO TABS: 0.6000 mg | ORAL_TABLET | Freq: Four times a day (QID) | ORAL | 5 refills | Status: AC | PRN

## 2022-12-09 NOTE — Progress Notes (Signed)
   Subjective:    Patient ID: Kiara Ramirez, female    DOB: 09-Nov-1950, 72 y.o.   MRN: [id number]  HPI Here to follow up on gout. She has had recurrent pain in the left foot for the past few months. She has been given 2 courses of oral steroids, and each time they have helped, but the pain has returned. She went to urgent care on 11-18-22 and they let her try Colchicine instead. This was more effective in reducing the swelling and pain. She has also started taking a turmeric supplement daily, and this has helped all her joints feel better. Her uric acid level that day was normal at 6.5.    Review of Systems  Constitutional: Negative.   Respiratory: Negative.    Cardiovascular: Negative.   Musculoskeletal:  Positive for arthralgias.       Objective:   Physical Exam Constitutional:      General: She is not in acute distress.    Appearance: Normal appearance.  Cardiovascular:     Rate and Rhythm: Normal rate and regular rhythm.     Pulses: Normal pulses.     Heart sounds: Normal heart sounds.  Pulmonary:     Effort: Pulmonary effort is normal.     Breath sounds: Normal breath sounds.  Musculoskeletal:     Comments: Left foot is normal with no swelling or tenderness or warmth   Neurological:     Mental Status: She is alert.           Assessment & Plan:  Her gout is responding to daily turmeric, so she will keep taking this. Use Colchicine as needed.  Gershon Crane, MD

## 2022-12-23 DIAGNOSIS — I129 Hypertensive chronic kidney disease with stage 1 through stage 4 chronic kidney disease, or unspecified chronic kidney disease: Secondary | ICD-10-CM | POA: Diagnosis not present

## 2022-12-23 DIAGNOSIS — E538 Deficiency of other specified B group vitamins: Secondary | ICD-10-CM | POA: Diagnosis not present

## 2022-12-23 DIAGNOSIS — N1832 Chronic kidney disease, stage 3b: Secondary | ICD-10-CM | POA: Diagnosis not present

## 2022-12-23 DIAGNOSIS — M109 Gout, unspecified: Secondary | ICD-10-CM | POA: Diagnosis not present

## 2022-12-23 DIAGNOSIS — N179 Acute kidney failure, unspecified: Secondary | ICD-10-CM | POA: Diagnosis not present

## 2022-12-23 DIAGNOSIS — N189 Chronic kidney disease, unspecified: Secondary | ICD-10-CM | POA: Diagnosis not present

## 2022-12-24 ENCOUNTER — Ambulatory Visit
Admission: RE | Admit: 2022-12-24 | Discharge: 2022-12-24 | Disposition: A | Discharge status: Home / Self Care | Payer: PPO | Source: Ambulatory Visit | Attending: Nephrology | Admitting: Nephrology

## 2022-12-24 ENCOUNTER — Other Ambulatory Visit: Payer: Self-pay | Admitting: Nephrology

## 2022-12-24 DIAGNOSIS — N179 Acute kidney failure, unspecified: Secondary | ICD-10-CM | POA: Diagnosis not present

## 2022-12-26 LAB — LAB REPORT - SCANNED
Albumin, Urine POC: 20.5
Albumin/Creatinine Ratio, Urine, POC: 13
Creatinine, POC: 158 mg/dL
EGFR: 65

## 2023-02-04 NOTE — Progress Notes (Unsigned)
[name] [address] [place] [address] Phone: [phone] Subjective:   INadine Counts, am serving as a scribe for Dr. Antoine Primas.  I'm seeing this patient by the request  of:  Nelwyn Salisbury, MD  CC: Bilateral knee pain follow-up  ION:[id number]  11/26/2022 Arthritic changes noted.  Discussed icing regimen exercises.  Discussed which activities to do and which ones to avoid.  Increase activity slowly.  Follow-up again in 6 to 8 weeks.  Discussed the possibility of viscosupplementation and PRP as well.      Update 02/05/2023 Kiara Ramirez is a 72 y.o. female coming in with complaint of L knee pain. Patient states pain comes and goes. When walking knee pops but no associated pain. Brace didn't work because it kept sliding down.      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 Russella Dar, incomplete exam     COLONOSCOPY  02/05/2022   Cologuard test was negative   CYSTOSCOPY  04/30/2018   Procedure: CYSTOSCOPY;  Surgeon: Osborn Coho, MD;  Location: [facility];  Service: Gynecology;;   DG BARIUM ENEMA ([facility] HX)  01/18/2019   per Dr. Russella Dar, incomplete exam, get CT colonography in 5 years    HYSTERECTOMY ABDOMINAL WITH SALPINGO-OOPHORECTOMY Bilateral 04/30/2018   Procedure: TOTAL ABDOMINAL HYSTERECTOMY WITH BILATERAL SALPINGO-OOPHORECTOMY;  Surgeon: Osborn Coho, MD;  Location: [facility];  Service: Gynecology;  Laterality: Bilateral;   LAPAROSCOPY  04/30/2018   Procedure: LAPAROSCOPY DIAGNOSTIC;  Surgeon: Osborn Coho, MD;  Location: [facility];  Service: Gynecology;;   TUBAL LIGATION     Social History   Socioeconomic History   Marital status: Married    Spouse name: Not on file   Number of children: 2    Years of education: 13   Highest education level: Not on file  Occupational History    Employer: [employer]  Tobacco Use   Smoking status: Never   Smokeless tobacco: Never  Vaping Use   Vaping status: Never Used  Substance and Sexual Activity   Alcohol use: No    Alcohol/week: 0.0 standard drinks of alcohol   Drug use: No   Sexual activity: Yes    Birth control/protection: Post-menopausal  Other Topics Concern   Not on file  Social History Narrative   Lives with husband in a one story home.  Has 2 children.   90 year old grandson lives with her.    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     Education: one year of college.    Social Determinants of Health   Financial Resource Strain: Not on file  Food Insecurity: Not on file  Transportation Needs: Not on file  Physical Activity: Not on file  Stress: Not on file  Social Connections: Not on file   Allergies  Allergen Reactions   Ace Inhibitors Swelling   Hydrocodone    Hydrocodone-Acetaminophen Nausea Only   Oxycodone Nausea And Vomiting    Headache   Family History  Problem Relation Age of Onset   Heart failure Mother        No details   Anemia Father  Breast cancer Sister    Cancer Other        colon/endometrial   Hypertension Other        family hx   Arthritis Other        family hx   Diabetes Other        family hx   Colon cancer Neg Hx    Esophageal cancer Neg Hx    Stomach cancer Neg Hx    Rectal cancer Neg Hx        Current Outpatient Medications (Analgesics):    aspirin 81 MG tablet, Take 81 mg by mouth daily.   colchicine 0.6 MG tablet, Take 1 tablet (0.6 mg total) by mouth every 6 (six) hours as needed (gout pain).  Current Outpatient Medications (Hematological):    Cyanocobalamin (VITAMIN B-12 PO), Take 1,000 mcg by mouth daily.  Current Outpatient Medications (Other):    clobetasol ointment (TEMOVATE) 0.05 %, clobetasol 0.05 % topical ointment  APPLY A THIN LAYER TO THE AFFECTED AREA  TWICE DAILY FOR 2 WEEKS   lidocaine (XYLOCAINE) 2 % solution, Use as directed 15 mLs in the mouth or throat as needed for mouth pain.   potassium chloride (KLOR-CON) 10 MEQ tablet, Take 2 tablets (20 mEq total) by mouth 2 (two) times daily.   Reviewed prior external information including notes and imaging from  primary care provider As well as notes that were available from care everywhere and other healthcare systems.  Past medical history, social, surgical and family history all reviewed in electronic medical record.  No pertanent information unless stated regarding to the chief complaint.   Review of Systems:  No headache, visual changes, nausea, vomiting, diarrhea, constipation, dizziness, abdominal pain, skin rash, fevers, chills, night sweats, weight loss, swollen lymph nodes, body aches,  chest pain, shortness of breath, mood changes. POSITIVE muscle aches, joint swelling  Objective  Blood pressure 126/76, pulse 74, height 5\' 2"  (1.575 m), weight 203 lb (92.1 kg), SpO2 97%.   General: No apparent distress alert and oriented x3 mood and affect normal, dressed appropriately.  HEENT: Pupils equal, extraocular movements intact  Respiratory: Patient's speak in full sentences and does not appear short of breath  Cardiovascular: No lower extremity edema, non tender, no erythema  Bilateral knee pain left seems to have more of the arthritic changes noted.  More tenderness.  Trace effusion noted.  Lacks the last 15 degrees of flexion in the last 5 degrees of extension.  Instability with valgus and varus force.  Patient is ambulatory.    Impression and Recommendations:     The above documentation has been reviewed and is accurate and complete Judi Saa, DO

## 2023-02-05 ENCOUNTER — Ambulatory Visit: Payer: PPO | Admitting: Family Medicine

## 2023-02-05 ENCOUNTER — Encounter: Payer: Self-pay | Admitting: Family Medicine

## 2023-02-05 VITALS — BP 126/76 | HR 74 | Ht 62.0 in | Wt 203.0 lb

## 2023-02-05 DIAGNOSIS — M1712 Unilateral primary osteoarthritis, left knee: Secondary | ICD-10-CM

## 2023-02-05 NOTE — Assessment & Plan Note (Signed)
Discussed with patient at great length.  Discussed multiple different treatment options for this individual.  We discussed the possibility of with patient's abnormal thigh to calf ratio, instability and will continue to be ambulatory we could consider a custom brace.  We also discussed multiple different medications as well as injections.  Patient at this point would like to continue with conservative therapy and will try to add in more low impact exercises to see how patient responds.  Follow-up with me again in 8-12weeks otherwise, total time with patient today 32 minutes

## 2023-02-05 NOTE — Patient Instructions (Signed)
Can start with steroids Stay active: with biking swimming See me in 2-3 months

## 2023-04-02 ENCOUNTER — Ambulatory Visit: Payer: PPO | Admitting: Family Medicine

## 2023-04-18 ENCOUNTER — Ambulatory Visit (INDEPENDENT_AMBULATORY_CARE_PROVIDER_SITE_OTHER): Payer: PPO

## 2023-04-18 VITALS — BP 120/62 | HR 79 | Temp 98.4°F | Ht 62.0 in | Wt 201.9 lb

## 2023-04-18 DIAGNOSIS — Z0000 Encounter for general adult medical examination without abnormal findings: Secondary | ICD-10-CM | POA: Diagnosis not present

## 2023-04-18 DIAGNOSIS — Z13820 Encounter for screening for osteoporosis: Secondary | ICD-10-CM

## 2023-04-18 NOTE — Progress Notes (Signed)
 [facility] [address] [place] [address] Phone: [phone] Subjective:   Kiara Ramirez, am serving as a scribe for Dr. Arthea Ramirez.  I'm seeing this patient by the request  of:  [name], MD  CC: Left knee follow-up  YEP:[id number]  02/05/2023 Discussed with patient at great length.  Discussed multiple different treatment options for this individual.  We discussed the possibility of with patient's abnormal thigh to calf ratio, instability and will continue to be ambulatory we could consider a custom brace.  We also discussed multiple different medications as well as injections.  Patient at this point would like to continue with conservative therapy and will try to add in more low impact exercises to see how patient responds.  Follow-up with me again in 8-12weeks otherwise, total time with patient today 32 minutes   Update 04/23/2023 Kiara Ramirez is a 73 y.o. female coming in with complaint of L knee pain. Patient states that the knee has been getting stiff on her lately, which has been making it hard to bend her knee.        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 Aneita, incomplete exam     COLONOSCOPY  02/05/2022   Cologuard test was negative   CYSTOSCOPY  04/30/2018   Procedure: CYSTOSCOPY;  Surgeon: Henry Slough, MD;  Location: [facility];  Service: Gynecology;;   DG BARIUM ENEMA ([facility] HX)  01/18/2019   per Dr. Aneita, incomplete exam, get CT colonography in 5 years    HYSTERECTOMY ABDOMINAL WITH SALPINGO-OOPHORECTOMY Bilateral 04/30/2018   Procedure: TOTAL ABDOMINAL HYSTERECTOMY WITH BILATERAL SALPINGO-OOPHORECTOMY;  Surgeon: Henry Slough, MD;  Location: [facility];  Service: Gynecology;   Laterality: Bilateral;   LAPAROSCOPY  04/30/2018   Procedure: LAPAROSCOPY DIAGNOSTIC;  Surgeon: Henry Slough, MD;  Location: [facility];  Service: Gynecology;;   TUBAL LIGATION     Social History   Socioeconomic History   Marital status: Married    Spouse name: Not on file   Number of children: 2   Years of education: 13   Highest education level: Not on file  Occupational History    Employer: [employer]  Tobacco Use   Smoking status: Never   Smokeless tobacco: Never  Vaping Use   Vaping status: Never Used  Substance and Sexual Activity   Alcohol use: No    Alcohol/week: 0.0 standard drinks of alcohol   Drug use: No   Sexual activity: Yes    Birth control/protection: Post-menopausal  Other Topics Concern   Not on file  Social History Narrative   Lives with husband in a one story home.  Has 2 children.   52 year old grandson lives with her.    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     Education: one year of college.    Social Drivers of Corporate Investment Banker Strain: Low Risk  (04/18/2023)   Overall Financial Resource Strain (CARDIA)    Difficulty of Paying Living Expenses: Not hard at all  Food Insecurity: No Food Insecurity (04/18/2023)   Hunger Vital Sign    Worried About Running  Out of Food in the Last Year: Never true    Ran Out of Food in the Last Year: Never true  Transportation Needs: No Transportation Needs (04/18/2023)   PRAPARE - Administrator, Civil Service (Medical): No    Lack of Transportation (Non-Medical): No  Physical Activity: Inactive (04/18/2023)   Exercise Vital Sign    Days of Exercise per Week: 0 days    Minutes of Exercise per Session: 0 min  Stress: No Stress Concern Present (04/18/2023)   [name]    Feeling of Stress : Not at all  Social Connections: Socially Integrated (04/18/2023)   Social Connection and Isolation Panel [NHANES]    Frequency of Communication with  Friends and Family: More than three times a week    Frequency of Social Gatherings with Friends and Family: More than three times a week    Attends Religious Services: More than 4 times per year    Active Member of [employer] or Organizations: Yes    Attends Engineer, Structural: More than 4 times per year    Marital Status: Married   Allergies  Allergen Reactions   Ace Inhibitors Swelling   Hydrocodone     Hydrocodone -Acetaminophen  Nausea Only   Oxycodone Nausea And Vomiting    Headache   Family History  Problem Relation Age of Onset   Heart failure Mother        No details   Anemia Father    Breast cancer Sister    Cancer Other        colon/endometrial   Hypertension Other        family hx   Arthritis Other        family hx   Diabetes Other        family hx   Colon cancer Neg Hx    Esophageal cancer Neg Hx    Stomach cancer Neg Hx    Rectal cancer Neg Hx        Current Outpatient Medications (Analgesics):    aspirin 81 MG tablet, Take 81 mg by mouth daily.   colchicine  0.6 MG tablet, Take 1 tablet (0.6 mg total) by mouth every 6 (six) hours as needed (gout pain).  Current Outpatient Medications (Hematological):    Cyanocobalamin  (VITAMIN B-12 PO), Take 1,000 mcg by mouth daily.  Current Outpatient Medications (Other):    clobetasol ointment (TEMOVATE) 0.05 %, clobetasol 0.05 % topical ointment  APPLY A THIN LAYER TO THE AFFECTED AREA TWICE DAILY FOR 2 WEEKS   lidocaine  (XYLOCAINE ) 2 % solution, Use as directed 15 mLs in the mouth or throat as needed for mouth pain.   potassium chloride  (KLOR-CON ) 10 MEQ tablet, Take 2 tablets (20 mEq total) by mouth 2 (two) times daily.   Reviewed prior external information including notes and imaging from  primary care provider As well as notes that were available from care everywhere and other healthcare systems.  Past medical history, social, surgical and family history all reviewed in electronic medical record.  No  pertanent information unless stated regarding to the chief complaint.   Review of Systems:  No headache, visual changes, nausea, vomiting, diarrhea, constipation, dizziness, abdominal pain, skin rash, fevers, chills, night sweats, weight loss, swollen lymph nodes, body aches, joint swelling, chest pain, shortness of breath, mood changes. POSITIVE muscle aches  Objective  Blood pressure 128/70, pulse 79, height 5' 2 (1.575 m), SpO2 99%.   General: No apparent distress alert and  oriented x3 mood and affect normal, dressed appropriately.  HEENT: Pupils equal, extraocular movements intact  Respiratory: Patient's speak in full sentences and does not appear short of breath  Cardiovascular: No lower extremity edema, non tender, no erythema  Mild antalgic gait noted.  Patient demonstrated the left knee still.  Tender to palpation bilaterally.  Does have evidence of decreased flexion.  Joint effusion noted.    Impression and Recommendations:     The above documentation has been reviewed and is accurate and complete Kiara Ramirez M Jurline Folger, DO

## 2023-04-18 NOTE — Progress Notes (Signed)
 Subjective:   Kiara Ramirez is a 73 y.o. female who presents for Medicare Annual (Subsequent) preventive examination.  Visit Complete: In person   Cardiac Risk Factors include: advanced age (>37men, >37 women);hypertension     Objective:    Today's Vitals   04/18/23 1038  BP: 120/62  Pulse: 79  Temp: 98.4 F (36.9 C)  TempSrc: Oral  SpO2: 99%  Weight: 201 lb 14.4 oz (91.6 kg)  Height: 5' 2 (1.575 m)   Body mass index is 36.93 kg/m.     04/18/2023   10:57 AM 04/18/2021    4:29 PM 06/03/2019    7:51 AM 04/30/2018    3:22 PM 04/30/2018    7:49 AM 04/29/2018   12:01 PM 09/05/2016    3:44 PM  Advanced Directives  Does Patient Have a Medical Advance Directive? No No No No;Yes Yes Yes No  Type of Advance Directive      Healthcare Power of Attorney   Does patient want to make changes to medical advance directive?    No - Patient declined     Copy of Healthcare Power of Attorney in Chart?    No - copy requested  No - copy requested   Would patient like information on creating a medical advance directive? No - Patient declined  No - Patient declined No - Patient declined   Yes (ED - Information included in AVS)    Current Medications (verified) Outpatient Encounter Medications as of 04/18/2023  Medication Sig   aspirin 81 MG tablet Take 81 mg by mouth daily.   clobetasol ointment (TEMOVATE) 0.05 % clobetasol 0.05 % topical ointment  APPLY A THIN LAYER TO THE AFFECTED AREA TWICE DAILY FOR 2 WEEKS   colchicine  0.6 MG tablet Take 1 tablet (0.6 mg total) by mouth every 6 (six) hours as needed (gout pain).   Cyanocobalamin  (VITAMIN B-12 PO) Take 1,000 mcg by mouth daily.   lidocaine  (XYLOCAINE ) 2 % solution Use as directed 15 mLs in the mouth or throat as needed for mouth pain.   potassium chloride  (KLOR-CON ) 10 MEQ tablet Take 2 tablets (20 mEq total) by mouth 2 (two) times daily.   No facility-administered encounter medications on file as of 04/18/2023.    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 Aneita, incomplete exam     COLONOSCOPY  02/05/2022   Cologuard test was negative   CYSTOSCOPY  04/30/2018   Procedure: CYSTOSCOPY;  Surgeon: Henry Slough, MD;  Location: [facility];  Service: Gynecology;;   DG BARIUM ENEMA ([facility] HX)  01/18/2019   per Dr. Aneita, incomplete exam, get CT colonography in 5 years    HYSTERECTOMY ABDOMINAL WITH SALPINGO-OOPHORECTOMY Bilateral 04/30/2018   Procedure: TOTAL ABDOMINAL HYSTERECTOMY WITH BILATERAL SALPINGO-OOPHORECTOMY;  Surgeon: Henry Slough, MD;  Location: [facility];  Service: Gynecology;  Laterality: Bilateral;   LAPAROSCOPY  04/30/2018   Procedure: LAPAROSCOPY DIAGNOSTIC;  Surgeon: Henry Slough, MD;  Location: [facility];  Service: Gynecology;;   TUBAL LIGATION     Family History  Problem Relation Age of Onset   Heart failure Mother  No details   Anemia Father    Breast cancer Sister    Cancer Other        colon/endometrial   Hypertension Other        family hx   Arthritis Other        family hx   Diabetes Other        family hx   Colon cancer Neg Hx    Esophageal cancer Neg Hx    Stomach cancer Neg Hx    Rectal cancer Neg Hx    Social History   Socioeconomic History   Marital status: Married    Spouse name: Not on file   Number of children: 2   Years of education: 13   Highest education level: Not on file  Occupational History    Employer: [employer]  Tobacco Use   Smoking status: Never   Smokeless tobacco: Never  Vaping Use   Vaping status: Never Used  Substance and Sexual Activity   Alcohol use: No    Alcohol/week: 0.0 standard drinks of alcohol   Drug use: No    Sexual activity: Yes    Birth control/protection: Post-menopausal  Other Topics Concern   Not on file  Social History Narrative   Lives with husband in a one story home.  Has 2 children.   63 year old grandson lives with her.    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     Education: one year of college.    Social Drivers of Corporate Investment Banker Strain: Low Risk  (04/18/2023)   Overall Financial Resource Strain (CARDIA)    Difficulty of Paying Living Expenses: Not hard at all  Food Insecurity: No Food Insecurity (04/18/2023)   Hunger Vital Sign    Worried About Running Out of Food in the Last Year: Never true    Ran Out of Food in the Last Year: Never true  Transportation Needs: No Transportation Needs (04/18/2023)   PRAPARE - Administrator, Civil Service (Medical): No    Lack of Transportation (Non-Medical): No  Physical Activity: Inactive (04/18/2023)   Exercise Vital Sign    Days of Exercise per Week: 0 days    Minutes of Exercise per Session: 0 min  Stress: No Stress Concern Present (04/18/2023)   [name]    Feeling of Stress : Not at all  Social Connections: Socially Integrated (04/18/2023)   Social Connection and Isolation Panel [NHANES]    Frequency of Communication with Friends and Family: More than three times a week    Frequency of Social Gatherings with Friends and Family: More than three times a week    Attends Religious Services: More than 4 times per year    Active Member of [employer] or Organizations: Yes    Attends Engineer, Structural: More than 4 times per year    Marital Status: Married    Tobacco Counseling Counseling given: Not Answered   Clinical Intake:  Pre-visit preparation completed: Yes  Pain : No/denies pain     BMI - recorded: 36.93 Nutritional Status: BMI > 30  Obese Nutritional Risks: None Diabetes: No  How often do you need to have someone help  you when you read instructions, pamphlets, or other written materials from your doctor or pharmacy?: 1 - Never  Interpreter Needed?: No  Information entered by :: Rojelio Blush LPN   Activities of Daily Living  04/18/2023   10:55 AM  In your present state of health, do you have any difficulty performing the following activities:  Hearing? 0  Vision? 0  Difficulty concentrating or making decisions? 0  Walking or climbing stairs? 0  Dressing or bathing? 0  Doing errands, shopping? 0  Preparing Food and eating ? N  Using the Toilet? N  In the past six months, have you accidently leaked urine? N  Do you have problems with loss of bowel control? N  Managing your Medications? N  Managing your Finances? N  Housekeeping or managing your Housekeeping? N    Patient Care Team: [name], MD as PCP - General  Indicate any recent Medical Services you may have received from other than [facility] providers in the past year (date may be approximate).     Assessment:   This is a routine wellness examination for Evalin.  Hearing/Vision screen Hearing Screening - Comments:: Denies hearing difficulties   Vision Screening - Comments:: Wears rx glasses - up to date with routine eye exams with  [facility]   Goals Addressed               This Visit's Progress     Increase physical activity (pt-stated)         Depression Screen    04/18/2023   10:38 AM 08/09/2022    8:41 AM 08/10/2021    8:58 AM 07/30/2021   10:46 AM 06/06/2020    9:50 AM 04/13/2018   12:59 PM 05/05/2017   11:14 AM  PHQ 2/9 Scores  PHQ - 2 Score 0 0 0 0 1 0 0  PHQ- 9 Score  0 0 0       Fall Risk    04/18/2023   10:57 AM 08/09/2022    8:41 AM 08/10/2021    8:57 AM 08/10/2021    8:06 AM 07/30/2021   10:46 AM  Fall Risk   Falls in the past year? 0 1 0 0 0  Number falls in past yr: 0 0 0 0 0  Injury with Fall? 0 0 0  0  Risk for fall due to : No Fall Risks No Fall Risks No Fall Risks  No Fall Risks  Follow up  Falls prevention discussed Falls evaluation completed Falls evaluation completed  Falls evaluation completed    MEDICARE RISK AT HOME: Medicare Risk at Home Any stairs in or around the home?: Yes If so, are there any without handrails?: No Home free of loose throw rugs in walkways, pet beds, electrical cords, etc?: Yes Adequate lighting in your home to reduce risk of falls?: Yes Life alert?: No Use of a cane, walker or w/c?: No Grab bars in the bathroom?: No Shower chair or bench in shower?: No Elevated toilet seat or a handicapped toilet?: No  TIMED UP AND GO:  Was the test performed?  Yes  Length of time to ambulate 10 feet: 10 sec Gait slow and steady without use of assistive device    Cognitive Function:        04/18/2023   10:58 AM  6CIT Screen  What Year? 0 points  What month? 0 points  What time? 0 points  Count back from 20 0 points  Months in reverse 0 points  Repeat phrase 0 points  Total Score 0 points    Immunizations Immunization History  Administered Date(s) Administered   Fluad Quad(high Dose 65+) 01/08/2022   Influenza Split 04/14/2012   Influenza, High Dose  Seasonal PF 04/13/2018   Influenza,inj,Quad PF,6+ Mos 02/16/2016   Influenza-Unspecified 02/14/2023   PFIZER(Purple Top)SARS-COV-2 Vaccination 05/04/2019, 05/25/2019, 02/04/2020, 09/04/2020   PNEUMOCOCCAL CONJUGATE-20 08/10/2021   Pneumococcal Conjugate-13 04/19/2019      Flu Vaccine status: Up to date  Pneumococcal vaccine status: Up to date  Covid-19 vaccine status: Declined, Education has been provided regarding the importance of this vaccine but patient still declined. Advised may receive this vaccine at local pharmacy or Health Dept.or vaccine clinic. Aware to provide a copy of the vaccination record if obtained from local pharmacy or Health Dept. Verbalized acceptance and understanding.  Qualifies for Shingles Vaccine? Yes   Zostavax completed No   Shingrix Completed?: No.     Education has been provided regarding the importance of this vaccine. Patient has been advised to call insurance company to determine out of pocket expense if they have not yet received this vaccine. Advised may also receive vaccine at local pharmacy or Health Dept. Verbalized acceptance and understanding.  Screening Tests Health Maintenance  Topic Date Due   Hepatitis C Screening  Never done   DEXA SCAN  Never done   COVID-19 Vaccine (5 - 2024-25 season) 12/15/2022   Zoster Vaccines- Shingrix (1 of 2) 08/13/2023 (Originally 11/13/2001)   MAMMOGRAM  06/13/2023   Medicare Annual Wellness (AWV)  04/17/2024   Colonoscopy  12/15/2028   Pneumonia Vaccine 16+ Years old  Completed   INFLUENZA VACCINE  Completed   HPV VACCINES  Aged Out   DTaP/Tdap/Td  Discontinued    Health Maintenance  Health Maintenance Due  Topic Date Due   Hepatitis C Screening  Never done   DEXA SCAN  Never done   COVID-19 Vaccine (5 - 2024-25 season) 12/15/2022    Colorectal cancer screening: Type of screening: Colonoscopy. Completed 12/16/18. Repeat every 10 years  Mammogram status: Completed 06/12/21. Repeat every year  Bone Density status: Ordered 04/18/23. Pt provided with contact info and advised to call to schedule appt.     Additional Screening:  Hepatitis C Screening: does qualify;  Deferred  Vision Screening: Recommended annual ophthalmology exams for early detection of glaucoma and other disorders of the eye. Is the patient up to date with their annual eye exam?  Yes  Who is the provider or what is the name of the office in which the patient attends annual eye exams? [facility] If pt is not established with a provider, would they like to be referred to a provider to establish care? No .   Dental Screening: Recommended annual dental exams for proper oral hygiene    Community Resource Referral / Chronic Care Management:  CRR required this visit?  No   CCM required this visit?  No     Plan:      I have personally reviewed and noted the following in the patient's chart:   Medical and social history Use of alcohol, tobacco or illicit drugs  Current medications and supplements including opioid prescriptions. Patient is not currently taking opioid prescriptions. Functional ability and status Nutritional status Physical activity Advanced directives List of other physicians Hospitalizations, surgeries, and ER visits in previous 12 months Vitals Screenings to include cognitive, depression, and falls Referrals and appointments  In addition, I have reviewed and discussed with patient certain preventive protocols, quality metrics, and best practice recommendations. A written personalized care plan for preventive services as well as general preventive health recommendations were provided to patient.     Rojelio LELON Blush, LPN   11/18/7972  After Visit Summary: (In Person-Printed) AVS printed and given to the patient  Nurse Notes: None

## 2023-04-18 NOTE — Patient Instructions (Addendum)
 Kiara Ramirez , Thank you for taking time to come for your Medicare Wellness Visit. I appreciate your ongoing commitment to your health goals. Please review the following plan we discussed and let me know if I can assist you in the future.   Referrals/Orders/Follow-Ups/Clinician Recommendations:   This is a list of the screening recommended for you and due dates:  Health Maintenance  Topic Date Due   Hepatitis C Screening  Never done   DEXA scan (bone density measurement)  Never done   COVID-19 Vaccine (5 - 2024-25 season) 12/15/2022   Zoster (Shingles) Vaccine (1 of 2) 08/13/2023*   Mammogram  06/13/2023   Medicare Annual Wellness Visit  04/17/2024   Colon Cancer Screening  12/15/2028   Pneumonia Vaccine  Completed   Flu Shot  Completed   HPV Vaccine  Aged Out   DTaP/Tdap/Td vaccine  Discontinued  *Topic was postponed. The date shown is not the original due date.    Advanced directives: (Provided) Advance directive discussed with you today. I have provided a copy for you to complete at home and have notarized. Once this is complete, please bring a copy in to our office so we can scan it into your chart.   Next Medicare Annual Wellness Visit scheduled for next year: Yes

## 2023-04-23 ENCOUNTER — Ambulatory Visit (INDEPENDENT_AMBULATORY_CARE_PROVIDER_SITE_OTHER): Payer: PPO | Admitting: Family Medicine

## 2023-04-23 ENCOUNTER — Encounter: Payer: Self-pay | Admitting: Family Medicine

## 2023-04-23 VITALS — BP 128/70 | HR 79 | Ht 62.0 in

## 2023-04-23 DIAGNOSIS — M1712 Unilateral primary osteoarthritis, left knee: Secondary | ICD-10-CM

## 2023-04-23 NOTE — Assessment & Plan Note (Signed)
 Known arthritic changes noted.  Discussed icing regimen and home exercises.  Continue to work on weight loss.  Still avoid anti-inflammatory secondary to intended, as well as chronic kidney disease.  Has had a history of gout and is working on her diet.  More weight loss she has the better she is going to do.  Follow-up with me again in 3 to 4 months.  Still has different injections available to her if needed.

## 2023-04-23 NOTE — Patient Instructions (Signed)
 You are doing amazing  Stay active Ice maybe if needed after a lot of activity  See me again in 3-4 months!

## 2023-05-13 ENCOUNTER — Ambulatory Visit
Admission: RE | Admit: 2023-05-13 | Discharge: 2023-05-13 | Disposition: A | Discharge status: Home / Self Care | Payer: PPO | Source: Ambulatory Visit | Attending: Family Medicine | Admitting: Family Medicine

## 2023-05-13 DIAGNOSIS — Z13820 Encounter for screening for osteoporosis: Secondary | ICD-10-CM

## 2023-08-05 NOTE — Progress Notes (Unsigned)
 [facility] [address] [place] [address] Phone: [phone] Subjective:   IBryan Caprio, am serving as a scribe for Dr. Ronnell Coins.  I'm seeing this patient by the request  of:  Donley Furth, MD  CC: Left knee pain and new onset back pain  [id number]  04/23/2023 Known arthritic changes noted.  Discussed icing regimen and home exercises.  Continue to work on weight loss.  Still avoid anti-inflammatory secondary to intended, as well as chronic kidney disease.  Has had a history of gout and is working on her diet.  More weight loss she has the better she is going to do.  Follow-up with me again in 3 to 4 months.  Still has different injections available to her if needed.   Update 08/06/2023 Johnda Billiot is a 73 y.o. female coming in with complaint of L knee pain. Patient states ready for injection. Flexion hurts the most. Low back starting to hurt.      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 Sandrea Cruel, incomplete exam     COLONOSCOPY  02/05/2022   Cologuard test was negative   CYSTOSCOPY  04/30/2018   Procedure: CYSTOSCOPY;  Surgeon: Renea Carrion, MD;  Location: [facility];  Service: Gynecology;;   DG BARIUM ENEMA ([facility] HX)  01/18/2019   per Dr. Sandrea Cruel, incomplete exam, get CT colonography in 5 years    HYSTERECTOMY ABDOMINAL WITH SALPINGO-OOPHORECTOMY Bilateral 04/30/2018   Procedure: TOTAL ABDOMINAL HYSTERECTOMY WITH BILATERAL SALPINGO-OOPHORECTOMY;  Surgeon: Renea Carrion, MD;  Location: [facility];  Service: Gynecology;  Laterality: Bilateral;   LAPAROSCOPY  04/30/2018   Procedure: LAPAROSCOPY DIAGNOSTIC;  Surgeon: Renea Carrion, MD;  Location: [facility];  Service: Gynecology;;   TUBAL LIGATION      Social History   Socioeconomic History   Marital status: Married    Spouse name: Not on file   Number of children: 2   Years of education: 13   Highest education level: Not on file  Occupational History    Employer: [employer]  Tobacco Use   Smoking status: Never   Smokeless tobacco: Never  Vaping Use   Vaping status: Never Used  Substance and Sexual Activity   Alcohol use: No    Alcohol/week: 0.0 standard drinks of alcohol   Drug use: No   Sexual activity: Yes    Birth control/protection: Post-menopausal  Other Topics Concern   Not on file  Social History Narrative   Lives with husband in a one story home.  Has 2 children.   76 year old grandson lives with her.    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     Education: one year of college.    Social Drivers of Corporate investment banker Strain: Low Risk  (04/18/2023)   Overall Financial Resource Strain (CARDIA)    Difficulty of Paying Living Expenses: Not hard at all  Food Insecurity: No Food Insecurity (04/18/2023)   Hunger Vital Sign    Worried About Running Out of Food in the Last Year: Never true    Ran Out of Food in the Last Year: Never true  Transportation Needs: No Transportation Needs (04/18/2023)   PRAPARE - Administrator, Civil Service (Medical): No    Lack of Transportation (Non-Medical): No  Physical Activity: Inactive (04/18/2023)   Exercise Vital Sign    Days of Exercise per Week: 0 days    Minutes of Exercise per Session: 0 min  Stress: No Stress Concern Present (04/18/2023)   [name]    Feeling of Stress : Not at all  Social Connections: Socially Integrated (04/18/2023)   Social Connection and Isolation Panel [NHANES]    Frequency of Communication with Friends and Family: More than three times a week    Frequency of Social Gatherings with Friends and Family: More than three times a week    Attends Religious Services: More than  4 times per year    Active Member of [employer] or Organizations: Yes    Attends Engineer, structural: More than 4 times per year    Marital Status: Married   Allergies  Allergen Reactions   Ace Inhibitors Swelling   Hydrocodone     Hydrocodone -Acetaminophen  Nausea Only   Oxycodone Nausea And Vomiting    Headache   Family History  Problem Relation Age of Onset   Heart failure Mother        No details   Anemia Father    Breast cancer Sister    Cancer Other        colon/endometrial   Hypertension Other        family hx   Arthritis Other        family hx   Diabetes Other        family hx   Colon cancer Neg Hx    Esophageal cancer Neg Hx    Stomach cancer Neg Hx    Rectal cancer Neg Hx        Current Outpatient Medications (Analgesics):    aspirin 81 MG tablet, Take 81 mg by mouth daily.   colchicine  0.6 MG tablet, Take 1 tablet (0.6 mg total) by mouth every 6 (six) hours as needed (gout pain).  Current Outpatient Medications (Hematological):    Cyanocobalamin  (VITAMIN B-12 PO), Take 1,000 mcg by mouth daily.  Current Outpatient Medications (Other):    clobetasol ointment (TEMOVATE) 0.05 %, clobetasol 0.05 % topical ointment  APPLY A THIN LAYER TO THE AFFECTED AREA TWICE DAILY FOR 2 WEEKS   lidocaine  (XYLOCAINE ) 2 % solution, Use as directed 15 mLs in the mouth or throat as needed for mouth pain.   potassium chloride  (KLOR-CON ) 10 MEQ tablet, Take 2 tablets (20 mEq total) by mouth 2 (two) times daily.   Reviewed prior external information including notes and imaging from  primary care provider As well as notes that were available from care everywhere and other healthcare systems.  Past medical history, social, surgical and family history all reviewed in electronic medical record.  No pertanent information unless stated regarding to the chief complaint.   Review of Systems:  No headache, visual changes, nausea, vomiting, diarrhea, constipation, dizziness,  abdominal pain, skin rash, fevers, chills, night sweats, weight loss, swollen lymph nodes, body aches, joint swelling, chest pain, shortness of breath, mood changes. POSITIVE muscle aches  Objective  Blood pressure 126/86, pulse 80, height 5\' 2"  (1.575 m), weight 208 lb (94.3 kg), SpO2 96%.   General: No apparent distress alert and oriented x3 mood and affect normal, dressed appropriately.  HEENT: Pupils equal, extraocular movements intact  Respiratory: Patient's  speak in full sentences and does not appear short of breath  Cardiovascular: No lower extremity edema, non tender, no erythema  Antalgic gait noted, varus deformity of the left knee.  Tenderness to palpation noted.  Trace effusion of the patellofemoral joint.  After informed written and verbal consent, patient was seated on exam table. Left knee was prepped with alcohol swab and utilizing anterolateral approach, patient's left knee space was injected with 4:1  marcaine  0.5%: Kenalog  40mg /dL. Patient tolerated the procedure well without immediate complications.    Impression and Recommendations:     The above documentation has been reviewed and is accurate and complete Tejah Brekke M Jock Mahon, DO

## 2023-08-06 ENCOUNTER — Ambulatory Visit (INDEPENDENT_AMBULATORY_CARE_PROVIDER_SITE_OTHER)

## 2023-08-06 ENCOUNTER — Ambulatory Visit: Payer: PPO | Admitting: Family Medicine

## 2023-08-06 ENCOUNTER — Encounter: Payer: Self-pay | Admitting: Family Medicine

## 2023-08-06 VITALS — BP 126/86 | HR 80 | Ht 62.0 in | Wt 208.0 lb

## 2023-08-06 DIAGNOSIS — M545 Low back pain, unspecified: Secondary | ICD-10-CM | POA: Diagnosis not present

## 2023-08-06 DIAGNOSIS — M160 Bilateral primary osteoarthritis of hip: Secondary | ICD-10-CM | POA: Diagnosis not present

## 2023-08-06 DIAGNOSIS — M1712 Unilateral primary osteoarthritis, left knee: Secondary | ICD-10-CM | POA: Diagnosis not present

## 2023-08-06 DIAGNOSIS — M4316 Spondylolisthesis, lumbar region: Secondary | ICD-10-CM | POA: Diagnosis not present

## 2023-08-06 DIAGNOSIS — M47816 Spondylosis without myelopathy or radiculopathy, lumbar region: Secondary | ICD-10-CM | POA: Diagnosis not present

## 2023-08-06 DIAGNOSIS — M549 Dorsalgia, unspecified: Secondary | ICD-10-CM | POA: Diagnosis not present

## 2023-08-06 DIAGNOSIS — R102 Pelvic and perineal pain: Secondary | ICD-10-CM | POA: Diagnosis not present

## 2023-08-06 NOTE — Patient Instructions (Addendum)
 Injection in knee today Xrays today Good to see you! Arnica lotion See you again in 3 months

## 2023-08-06 NOTE — Assessment & Plan Note (Signed)
 Multifactorial, no radiation.  Discussed icing regimen.  Will get x-rays to further evaluate.  Does have chronic kidney disease and if continuing to have difficulty will need to consider workup including laboratory and urinalysis.  Follow-up again in 3 months

## 2023-08-06 NOTE — Assessment & Plan Note (Signed)
 Patient having worsening pain significant improvement after the injection today.  Discussed with patient about icing regimen and home exercise, which activities are normal.  Avoid.  Increase activity slowly.  Patient will continue to work on weight loss.  Would like her BMI to be at least less than 35.  X-rays of the back ordered today secondary to new onset of back pain.  Follow-up again in 3 months

## 2023-08-08 DIAGNOSIS — Z13820 Encounter for screening for osteoporosis: Secondary | ICD-10-CM | POA: Diagnosis not present

## 2023-08-08 DIAGNOSIS — Z1231 Encounter for screening mammogram for malignant neoplasm of breast: Secondary | ICD-10-CM | POA: Diagnosis not present

## 2023-08-08 DIAGNOSIS — L900 Lichen sclerosus et atrophicus: Secondary | ICD-10-CM | POA: Diagnosis not present

## 2023-08-08 DIAGNOSIS — Z01419 Encounter for gynecological examination (general) (routine) without abnormal findings: Secondary | ICD-10-CM | POA: Diagnosis not present

## 2023-08-11 ENCOUNTER — Other Ambulatory Visit: Payer: Self-pay | Admitting: Obstetrics and Gynecology

## 2023-08-11 DIAGNOSIS — Z13820 Encounter for screening for osteoporosis: Secondary | ICD-10-CM

## 2023-08-13 ENCOUNTER — Encounter: Payer: Self-pay | Admitting: Family Medicine

## 2023-08-14 ENCOUNTER — Encounter: Payer: Self-pay | Admitting: Family Medicine

## 2023-08-15 ENCOUNTER — Other Ambulatory Visit: Payer: Self-pay | Admitting: Family Medicine

## 2023-09-20 ENCOUNTER — Emergency Department (HOSPITAL_BASED_OUTPATIENT_CLINIC_OR_DEPARTMENT_OTHER)

## 2023-09-20 ENCOUNTER — Emergency Department (HOSPITAL_BASED_OUTPATIENT_CLINIC_OR_DEPARTMENT_OTHER)
Admission: EM | Admit: 2023-09-20 | Discharge: 2023-09-20 | Disposition: A | Discharge status: Home / Self Care | Attending: Emergency Medicine | Admitting: Emergency Medicine

## 2023-09-20 ENCOUNTER — Encounter (HOSPITAL_BASED_OUTPATIENT_CLINIC_OR_DEPARTMENT_OTHER): Payer: Self-pay

## 2023-09-20 ENCOUNTER — Other Ambulatory Visit: Payer: Self-pay

## 2023-09-20 DIAGNOSIS — Z7982 Long term (current) use of aspirin: Secondary | ICD-10-CM | POA: Insufficient documentation

## 2023-09-20 DIAGNOSIS — R519 Headache, unspecified: Secondary | ICD-10-CM

## 2023-09-20 DIAGNOSIS — Z79899 Other long term (current) drug therapy: Secondary | ICD-10-CM | POA: Diagnosis not present

## 2023-09-20 DIAGNOSIS — R0789 Other chest pain: Secondary | ICD-10-CM | POA: Diagnosis not present

## 2023-09-20 DIAGNOSIS — I10 Essential (primary) hypertension: Secondary | ICD-10-CM | POA: Diagnosis not present

## 2023-09-20 DIAGNOSIS — R079 Chest pain, unspecified: Secondary | ICD-10-CM | POA: Diagnosis not present

## 2023-09-20 DIAGNOSIS — I159 Secondary hypertension, unspecified: Secondary | ICD-10-CM

## 2023-09-20 LAB — CBC WITH DIFFERENTIAL/PLATELET
Abs Immature Granulocytes: 0.02 10*3/uL (ref 0.00–0.07)
Basophils Absolute: 0 10*3/uL (ref 0.0–0.1)
Basophils Relative: 0 %
Eosinophils Absolute: 0.1 10*3/uL (ref 0.0–0.5)
Eosinophils Relative: 1 %
HCT: 37.5 % (ref 36.0–46.0)
Hemoglobin: 12.1 g/dL (ref 12.0–15.0)
Immature Granulocytes: 0 %
Lymphocytes Relative: 30 %
Lymphs Abs: 2.4 10*3/uL (ref 0.7–4.0)
MCH: 28.9 pg (ref 26.0–34.0)
MCHC: 32.3 g/dL (ref 30.0–36.0)
MCV: 89.7 fL (ref 80.0–100.0)
Monocytes Absolute: 0.4 10*3/uL (ref 0.1–1.0)
Monocytes Relative: 5 %
Neutro Abs: 5 10*3/uL (ref 1.7–7.7)
Neutrophils Relative %: 64 %
Platelets: 339 10*3/uL (ref 150–400)
RBC: 4.18 MIL/uL (ref 3.87–5.11)
RDW: 14.6 % (ref 11.5–15.5)
WBC: 7.8 10*3/uL (ref 4.0–10.5)
nRBC: 0 % (ref 0.0–0.2)

## 2023-09-20 LAB — BASIC METABOLIC PANEL WITH GFR
Anion gap: 9 (ref 5–15)
BUN: 15 mg/dL (ref 8–23)
CO2: 27 mmol/L (ref 22–32)
Calcium: 8.9 mg/dL (ref 8.9–10.3)
Chloride: 104 mmol/L (ref 98–111)
Creatinine, Ser: 0.89 mg/dL (ref 0.44–1.00)
GFR, Estimated: >60 mL/min (ref >60)
Glucose, Bld: 88 mg/dL (ref 70–99)
Potassium: 4.1 mmol/L (ref 3.5–5.1)
Sodium: 140 mmol/L (ref 135–145)

## 2023-09-20 LAB — TROPONIN T, HIGH SENSITIVITY: Troponin T High Sensitivity: <15 ng/L (ref <19)

## 2023-09-20 MED ORDER — AMLODIPINE BESYLATE 5 MG PO TABS: 5.0000 mg | ORAL_TABLET | Freq: Every day | ORAL | 0 refills | Status: DC

## 2023-09-20 MED ORDER — ACETAMINOPHEN 500 MG PO TABS
1000.0000 mg | ORAL_TABLET | Freq: Once | ORAL | Status: AC
  Administered 2023-09-20: 1000 mg via ORAL
  Filled 2023-09-20: qty 2

## 2023-09-20 NOTE — ED Triage Notes (Signed)
 Patient noticed her BP was high x2 days Chest tightness when breathing

## 2023-09-20 NOTE — ED Provider Notes (Signed)
 [place] EMERGENCY DEPARTMENT AT [facility] Provider Note   CSN: [id number] Arrival date & time: 09/20/23  1017     History  Chief Complaint  Patient presents with   Hypertension    Annaleese Guier is a 73 y.o. female.  Patient here with chest pain headache high blood pressure last couple days.  History of hypertension but not on any medication for currently.  Denies any weakness numbness tingling.  Denies any shortness of breath cough sputum production.  Has had some mild chest tightness on and off this morning.  Mild headache currently.  Denies any exertional symptoms.  Denies any discomfort the last couple days or in the past.  Denies any recent surgery or travel.  No blood clot history.  The history is provided by the patient.       Home Medications Prior to Admission medications   Medication Sig Start Date End Date Taking? Authorizing Provider  amLODipine  (NORVASC ) 5 MG tablet Take 1 tablet (5 mg total) by mouth daily. 09/20/23  Yes Alexee Delsanto, DO  aspirin 81 MG tablet Take 81 mg by mouth daily.    [provider]  clobetasol ointment (TEMOVATE) 0.05 % clobetasol 0.05 % topical ointment  APPLY A THIN LAYER TO THE AFFECTED AREA TWICE DAILY FOR 2 WEEKS    [provider]  colchicine  0.6 MG tablet Take 1 tablet (0.6 mg total) by mouth every 6 (six) hours as needed (gout pain). 12/09/22   Donley Furth, MD  Cyanocobalamin  (VITAMIN B-12 PO) Take 1,000 mcg by mouth daily.    [provider]  lidocaine  (XYLOCAINE ) 2 % solution Use as directed 15 mLs in the mouth or throat as needed for mouth pain. 03/30/21   Reena Canning, NP  potassium chloride  (KLOR-CON ) 10 MEQ tablet Take 2 tablets by mouth twice daily 08/15/23   Donley Furth, MD      Allergies    Ace inhibitors, Hydrocodone , Hydrocodone -acetaminophen , and Oxycodone    Review of Systems   Review of Systems  Physical Exam Updated Vital Signs BP (!) 175/90 (BP Location: Right Arm)    Pulse 95   Temp 98 F (36.7 C) (Oral)   Resp 18   Wt 90.3 kg   LMP  (LMP Unknown)   SpO2 95%   BMI 36.40 kg/m  Physical Exam Vitals and nursing note reviewed.  Constitutional:      General: She is not in acute distress.    Appearance: She is well-developed. She is not ill-appearing.  HENT:     Head: Normocephalic and atraumatic.     Nose: Nose normal.     Mouth/Throat:     Mouth: Mucous membranes are moist.  Eyes:     Extraocular Movements: Extraocular movements intact.     Conjunctiva/sclera: Conjunctivae normal.     Pupils: Pupils are equal, round, and reactive to light.  Cardiovascular:     Rate and Rhythm: Normal rate and regular rhythm.     Pulses: Normal pulses.     Heart sounds: Normal heart sounds. No murmur heard. Pulmonary:     Effort: Pulmonary effort is normal. No respiratory distress.     Breath sounds: Normal breath sounds.  Abdominal:     General: Abdomen is flat.     Palpations: Abdomen is soft.     Tenderness: There is no abdominal tenderness.  Musculoskeletal:        General: No swelling.     Cervical back: Normal range of motion and  neck supple.  Skin:    General: Skin is warm and dry.     Capillary Refill: Capillary refill takes less than 2 seconds.  Neurological:     General: No focal deficit present.     Mental Status: She is alert and oriented to person, place, and time.     Cranial Nerves: No cranial nerve deficit.     Sensory: No sensory deficit.     Motor: No weakness.     Coordination: Coordination normal.     Comments: 5+ out of 5 strength, normal sensation, no drift, normal finger-nose-finger, normal speech  Psychiatric:        Mood and Affect: Mood normal.     ED Results / Procedures / Treatments   Labs (all labs ordered are listed, but only abnormal results are displayed) Labs Reviewed  CBC WITH DIFFERENTIAL/PLATELET  BASIC METABOLIC PANEL WITH GFR  TROPONIN T, HIGH SENSITIVITY    EKG EKG  Interpretation Date/Time:  Saturday September 20 2023 10:26:22 EDT Ventricular Rate:  93 PR Interval:  200 QRS Duration:  84 QT Interval:  357 QTC Calculation: 444 R Axis:   -59  Text Interpretation: Sinus rhythm Confirmed by Lowery Rue [id number]) on 09/20/2023 10:30:09 AM  Radiology CT Head Wo Contrast Result Date: 09/20/2023 CLINICAL DATA:  73 year old female with headache, hypertensive for 2 days. EXAM: CT HEAD WITHOUT CONTRAST TECHNIQUE: Contiguous axial images were obtained from the base of the skull through the vertex without intravenous contrast. RADIATION DOSE REDUCTION: This exam was performed according to the departmental dose-optimization program which includes automated exposure control, adjustment of the mA and/or kV according to patient size and/or use of iterative reconstruction technique. COMPARISON:  Brain MRI and Head CT03/19/2024. FINDINGS: Brain: Cerebral volume is stable and normal for age. No midline shift, ventriculomegaly, mass effect, evidence of mass lesion, intracranial hemorrhage or evidence of cortically based acute infarction. Gray-white matter differentiation is stable. Mild for age patchy periatrial white matter hypodensity is stable from last year. Vascular: No suspicious intracranial vascular hyperdensity. Calcified atherosclerosis at the skull base. Skull: Stable and negative. Sinuses/Orbits: Visualized paranasal sinuses and mastoids are clear. Other: Visualized orbits and scalp soft tissues are within normal limits. IMPRESSION: No acute intracranial abnormality. Stable non contrast CT appearance of mild for age chronic white matter changes. Electronically Signed   By: Marlise Simpers M.D.   On: 09/20/2023 11:07   DG Chest Portable 1 View Result Date: 09/20/2023 CLINICAL DATA:  Chest pain. EXAM: PORTABLE CHEST 1 VIEW COMPARISON:  07/02/2022. FINDINGS: Bilateral lung fields are clear. Bilateral costophrenic angles are clear. Normal cardio-mediastinal silhouette. No acute osseous  abnormalities. The soft tissues are within normal limits. IMPRESSION: No active disease. Electronically Signed   By: Beula Brunswick M.D.   On: 09/20/2023 10:55    Procedures Procedures    Medications Ordered in ED Medications  acetaminophen  (TYLENOL ) tablet 1,000 mg (1,000 mg Oral Given 09/20/23 1047)    ED Course/ Medical Decision Making/ A&P                                 Medical Decision Making Amount and/or Complexity of Data Reviewed Labs: ordered. Radiology: ordered.  Risk OTC drugs. Prescription drug management.   Bobie Caris is here with concern for her high blood pressure.  Maybe some chest discomfort here and there.  Blood pressure is 175/90.  She is very well-appearing otherwise.  Neurologically intact.  EKG shows  sinus rhythm.  No ischemic changes.  Differential diagnosis likely stressor related process and seems less likely to be ACS head bleed electrolyte abnormality or infectious process.  Have no concern for PE.  Wells criteria 0.  Ultimately I think she looks very well.  She has a very reassuring EKG.  Seems like she has a history of high blood pressure but she is not on medication.  Ultimately will check labs troponin head CT and initiate blood pressure meds assuming lab works unremarkable.  I have no concern for stroke.  Overall lab work shows no significant leukocytosis anemia or electrolyte abnormality.  Head CT normal.  Chest x-ray unremarkable.  I reviewed interpreted labs and imaging.  Reviewed radiology reports as well.  She is very well-appearing.  I question if she does have high blood pressure.  Will start her amlodipine  and told her to take this if her blood pressure continues to be elevated over the course of the next few days.  Educated her about blood pressure and stated that if blood pressure continue to be over 150/90 that she should start amlodipine  and follow-up with her primary care doctor in 1 to 2 weeks either way.  Told to return if symptoms worsen.   Have no concern for ACS or other acute process at this time.  Discharge.  This chart was dictated using voice recognition software.  Despite best efforts to proofread,  errors can occur which can change the documentation meaning.         Final Clinical Impression(s) / ED Diagnoses Final diagnoses:  Secondary hypertension  Atypical chest pain  Nonintractable headache, unspecified chronicity pattern, unspecified headache type    Rx / DC Orders ED Discharge Orders          Ordered    amLODipine  (NORVASC ) 5 MG tablet  Daily        09/20/23 1140              Yacqub Baston, Raylene Calamity, DO 09/20/23 1142

## 2023-09-20 NOTE — Discharge Instructions (Signed)
 Please return if symptoms worsen.  I prescribed you amlodipine  to start for blood pressure if you continue to have blood pressure greater than 150/90.  Follow-up with your primary care doctor for recheck of your blood pressure.

## 2023-09-22 ENCOUNTER — Encounter: Payer: Self-pay | Admitting: Family Medicine

## 2023-09-22 ENCOUNTER — Ambulatory Visit (INDEPENDENT_AMBULATORY_CARE_PROVIDER_SITE_OTHER): Admitting: Family Medicine

## 2023-09-22 VITALS — BP 146/96 | HR 72 | Temp 98.0°F | Wt 211.0 lb

## 2023-09-22 DIAGNOSIS — I10 Essential (primary) hypertension: Secondary | ICD-10-CM

## 2023-09-22 MED ORDER — AMLODIPINE BESYLATE 5 MG PO TABS: 5.0000 mg | ORAL_TABLET | Freq: Every day | ORAL | Status: DC

## 2023-09-22 NOTE — Progress Notes (Signed)
   Subjective:    Patient ID: Kiara Ramirez, female    DOB: [birth year], 73 y.o.   MRN: [id number]  HPI Here to follow up an ED visit on 09-20-23 for headache and high BP. She had no chest pain or SOB. On arrival to the ED her BP was 175/90. Her exam and labs and EKG were al normal. She had a non-contrasted head CT which was normal. She was given a RX for Amlodipine  5 mg daily, but she has not started taking this yet. She was concerned that it may be an ACE inhibitor, and she had angioedema on the past from these. Her BP at home has been steady around 140's over 90's.    Review of Systems  Constitutional: Negative.   Respiratory: Negative.    Cardiovascular: Negative.   Neurological: Negative.        Objective:   Physical Exam Constitutional:      Appearance: Normal appearance.  Cardiovascular:     Rate and Rhythm: Normal rate and regular rhythm.     Pulses: Normal pulses.     Heart sounds: Normal heart sounds.  Pulmonary:     Effort: Pulmonary effort is normal.     Breath sounds: Normal breath sounds.  Musculoskeletal:     Right lower leg: No edema.     Left lower leg: No edema.  Neurological:     Mental Status: She is alert.           Assessment & Plan:  HTN. I reassured her that Amlodipine  is a calcium channel blocker and not an ACE inhibitor. She agreed to start taking this today. Report back in 3-4 weeks. We spent a total of ( 34  ) minutes reviewing records and discussing these issues.  Corita Diego, MD

## 2023-10-08 DIAGNOSIS — N179 Acute kidney failure, unspecified: Secondary | ICD-10-CM | POA: Diagnosis not present

## 2023-10-08 DIAGNOSIS — K219 Gastro-esophageal reflux disease without esophagitis: Secondary | ICD-10-CM | POA: Diagnosis not present

## 2023-10-08 DIAGNOSIS — I129 Hypertensive chronic kidney disease with stage 1 through stage 4 chronic kidney disease, or unspecified chronic kidney disease: Secondary | ICD-10-CM | POA: Diagnosis not present

## 2023-10-08 DIAGNOSIS — D631 Anemia in chronic kidney disease: Secondary | ICD-10-CM | POA: Diagnosis not present

## 2023-10-08 DIAGNOSIS — M109 Gout, unspecified: Secondary | ICD-10-CM | POA: Diagnosis not present

## 2023-10-08 DIAGNOSIS — N189 Chronic kidney disease, unspecified: Secondary | ICD-10-CM | POA: Diagnosis not present

## 2023-10-21 ENCOUNTER — Other Ambulatory Visit: Payer: Self-pay | Admitting: Family Medicine

## 2023-10-21 MED ORDER — AMLODIPINE BESYLATE 5 MG PO TABS: 5.0000 mg | ORAL_TABLET | Freq: Every day | ORAL | Status: DC

## 2023-10-21 NOTE — Telephone Encounter (Signed)
 Copied from CRM [id number]. Topic: Clinical - Medication Refill >> Oct 21, 2023  8:54 AM Ernestene P wrote: Medication: amLODipine  (NORVASC ) 5 MG tablet  Has the patient contacted their pharmacy? Yes (Agent: If no, request that the patient contact the pharmacy for the refill. If patient does not wish to contact the pharmacy document the reason why and proceed with request.) (Agent: If yes, when and what did the pharmacy advise?)  This is the patient's preferred pharmacy:  [id number] - [place] - [address] [place] ([place]) [place] [address] Phone: [phone] Fax: [phone]  Is this the correct pharmacy for this prescription? Yes If no, delete pharmacy and type the correct one.   Has the prescription been filled recently? No  Is the patient out of the medication? Yes- has 1 day left   Has the patient been seen for an appointment in the last year OR does the patient have an upcoming appointment? Yes  Can we respond through MyChart? Yes  Agent: Please be advised that Rx refills may take up to 3 business days. We ask that you follow-up with your pharmacy.

## 2023-10-24 ENCOUNTER — Ambulatory Visit (INDEPENDENT_AMBULATORY_CARE_PROVIDER_SITE_OTHER): Admitting: Family Medicine

## 2023-10-24 ENCOUNTER — Encounter: Payer: Self-pay | Admitting: Family Medicine

## 2023-10-24 VITALS — BP 120/76 | HR 84 | Temp 97.8°F | Ht 62.0 in | Wt 210.0 lb

## 2023-10-24 DIAGNOSIS — I10 Essential (primary) hypertension: Secondary | ICD-10-CM | POA: Diagnosis not present

## 2023-10-24 MED ORDER — AMLODIPINE BESYLATE 5 MG PO TABS: 5.0000 mg | ORAL_TABLET | Freq: Every day | ORAL | 3 refills | Status: DC

## 2023-10-24 NOTE — Progress Notes (Signed)
   Subjective:    Patient ID: Kiara Ramirez, female    DOB: 10/12/1950, 73 y.o.   MRN: [id number]  HPI Here to follow up on HTN. She started taking Amlodipine  5 mg daily a month ago. Now her BP at home averages in the 130's over 80's, and she feel fine.    Review of Systems  Constitutional: Negative.   Respiratory: Negative.    Cardiovascular: Negative.   Neurological: Negative.        Objective:   Physical Exam Constitutional:      Appearance: Normal appearance.  Cardiovascular:     Rate and Rhythm: Normal rate and regular rhythm.     Pulses: Normal pulses.     Heart sounds: Normal heart sounds.  Pulmonary:     Effort: Pulmonary effort is normal.     Breath sounds: Normal breath sounds.  Musculoskeletal:     Right lower leg: No edema.     Left lower leg: No edema.  Neurological:     Mental Status: She is alert.           Assessment & Plan:  HTN, now well controlled. Recheck in 6 months.  Garnette Olmsted, MD

## 2023-11-06 NOTE — Progress Notes (Unsigned)
 [name] [address] [place] [address] Phone: [phone] Subjective:   [name], am serving as a scribe for Dr. Arthea Claudene.  I'm seeing this patient by the request  of:  [name], MD  CC: knee and back pain   YEP:Dlagzrupcz  08/06/2023 Patient having worsening pain significant improvement after the injection today.  Discussed with patient about icing regimen and home exercise, which activities are normal.  Avoid.  Increase activity slowly.  Patient will continue to work on weight loss.  Would like her BMI to be at least less than 35.  X-rays of the back ordered today secondary to new onset of back pain.  Follow-up again in 3 months     Multifactorial, no radiation.  Discussed icing regimen.  Will get x-rays to further evaluate.  Does have chronic kidney disease and if continuing to have difficulty will need to consider workup including laboratory and urinalysis.  Follow-up again in 3 months      Update 11/07/2023 Angeliah Wisdom is a 73 y.o. female coming in with complaint of L knee and LBP. Patient states that she has been doing well. No longer having popping sensation. Does have some stiffness.   Back pain has subsided.   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 Aneita, incomplete exam     COLONOSCOPY  02/05/2022   Cologuard test was negative   CYSTOSCOPY  04/30/2018   Procedure: CYSTOSCOPY;  Surgeon: Henry Slough, MD;  Location: [facility];  Service: Gynecology;;   DG BARIUM ENEMA ([facility] HX)  01/18/2019   per Dr. Aneita, incomplete exam, get CT colonography in 5 years    HYSTERECTOMY ABDOMINAL WITH  SALPINGO-OOPHORECTOMY Bilateral 04/30/2018   Procedure: TOTAL ABDOMINAL HYSTERECTOMY WITH BILATERAL SALPINGO-OOPHORECTOMY;  Surgeon: Henry Slough, MD;  Location: [facility];  Service: Gynecology;  Laterality: Bilateral;   LAPAROSCOPY  04/30/2018   Procedure: LAPAROSCOPY DIAGNOSTIC;  Surgeon: Henry Slough, MD;  Location: [facility];  Service: Gynecology;;   TUBAL LIGATION     Social History   Socioeconomic History   Marital status: Married    Spouse name: Not on file   Number of children: 2   Years of education: 13   Highest education level: Not on file  Occupational History    Employer: [employer]  Tobacco Use   Smoking status: Never   Smokeless tobacco: Never  Vaping Use   Vaping status: Never Used  Substance and Sexual Activity   Alcohol use: No    Alcohol/week: 0.0 standard drinks of alcohol   Drug use: No   Sexual activity: Yes    Birth control/protection: Post-menopausal  Other Topics Concern   Not on file  Social History Narrative   Lives with husband in a one story home.  Has 2 children.   53 year old grandson lives with her.    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     Education: one year of college.    Social Drivers of Corporate investment banker  Strain: Low Risk  (04/18/2023)   Overall Financial Resource Strain (CARDIA)    Difficulty of Paying Living Expenses: Not hard at all  Food Insecurity: No Food Insecurity (04/18/2023)   Hunger Vital Sign    Worried About Running Out of Food in the Last Year: Never true    Ran Out of Food in the Last Year: Never true  Transportation Needs: No Transportation Needs (04/18/2023)   PRAPARE - Administrator, Civil Service (Medical): No    Lack of Transportation (Non-Medical): No  Physical Activity: Inactive (04/18/2023)   Exercise Vital Sign    Days of Exercise per Week: 0 days    Minutes of Exercise per Session: 0 min  Stress: No Stress Concern Present (04/18/2023)   [employer] of Occupational Health -  Occupational Stress Questionnaire    Feeling of Stress : Not at all  Social Connections: Socially Integrated (04/18/2023)   Social Connection and Isolation Panel    Frequency of Communication with Friends and Family: More than three times a week    Frequency of Social Gatherings with Friends and Family: More than three times a week    Attends Religious Services: More than 4 times per year    Active Member of [employer] or Organizations: Yes    Attends Engineer, structural: More than 4 times per year    Marital Status: Married   Allergies  Allergen Reactions   Ace Inhibitors Swelling   Hydrocodone     Hydrocodone -Acetaminophen  Nausea Only   Oxycodone Nausea And Vomiting    Headache   Family History  Problem Relation Age of Onset   Heart failure Mother        No details   Anemia Father    Breast cancer Sister    Cancer Other        colon/endometrial   Hypertension Other        family hx   Arthritis Other        family hx   Diabetes Other        family hx   Colon cancer Neg Hx    Esophageal cancer Neg Hx    Stomach cancer Neg Hx    Rectal cancer Neg Hx      Current Outpatient Medications (Cardiovascular):    amLODipine  (NORVASC ) 5 MG tablet, Take 1 tablet (5 mg total) by mouth daily.   Current Outpatient Medications (Analgesics):    aspirin 81 MG tablet, Take 81 mg by mouth daily.   colchicine  0.6 MG tablet, Take 1 tablet (0.6 mg total) by mouth every 6 (six) hours as needed (gout pain).  Current Outpatient Medications (Hematological):    Cyanocobalamin  (VITAMIN B-12 PO), Take 1,000 mcg by mouth daily.  Current Outpatient Medications (Other):    clobetasol ointment (TEMOVATE) 0.05 %, clobetasol 0.05 % topical ointment  APPLY A THIN LAYER TO THE AFFECTED AREA TWICE DAILY FOR 2 WEEKS   lidocaine  (XYLOCAINE ) 2 % solution, Use as directed 15 mLs in the mouth or throat as needed for mouth pain.   potassium chloride  (KLOR-CON ) 10 MEQ tablet, Take 2 tablets by mouth twice  daily   Tart Cherry 1200 MG CAPS, Take by mouth.   Turmeric (QC TUMERIC COMPLEX PO), Take by mouth.     Objective  Blood pressure 124/84, pulse 85, height 5' 2 (1.575 m), weight 213 lb (96.6 kg), SpO2 95%.   General: No apparent distress alert and oriented x3 mood and affect normal, dressed appropriately.  HEENT: Pupils equal,  extraocular movements intact  Respiratory: Patient's speak in full sentences and does not appear short of breath  Cardiovascular: Trace effusion noted Knee exam shows patient does have some tenderness laterally.  Trace effusion of the patellofemoral joint noted.  Nothing severe.  Back exam shows patient does have some loss of lordosis noted as well.     Impression and Recommendations:    The above documentation has been reviewed and is accurate and complete Terriana Barreras M Zyiah Withington, DO

## 2023-11-07 ENCOUNTER — Ambulatory Visit: Admitting: Family Medicine

## 2023-11-07 ENCOUNTER — Encounter: Payer: Self-pay | Admitting: Family Medicine

## 2023-11-07 VITALS — BP 124/84 | HR 85 | Ht 62.0 in | Wt 213.0 lb

## 2023-11-07 DIAGNOSIS — M1712 Unilateral primary osteoarthritis, left knee: Secondary | ICD-10-CM

## 2023-11-07 NOTE — Assessment & Plan Note (Signed)
 Patient does have significant arthritic changes but has done really well with the injection.  Discussed with patient to continue to stay active.  I think that this has been significantly helpful.  Discussed which activities to do and which ones to avoid.  Increase activity slowly.  Follow-up with me again in 3 months if needed.

## 2023-11-07 NOTE — Patient Instructions (Signed)
 Doing fabulous Don't change a thing See me in 3 months

## 2023-12-03 ENCOUNTER — Other Ambulatory Visit: Payer: Self-pay | Admitting: Family Medicine

## 2023-12-23 ENCOUNTER — Ambulatory Visit: Payer: Self-pay | Admitting: *Deleted

## 2023-12-23 NOTE — Telephone Encounter (Signed)
 Please work her into our schedule for tomorrow

## 2023-12-23 NOTE — Telephone Encounter (Signed)
 First available app in on Friday, Please advise

## 2023-12-23 NOTE — Telephone Encounter (Signed)
 FYI Only or Action Required?: Action required by provider: request for appointment, update on patient condition, and please advise regarding continuing to take amlodipine  , patient did not take dose this am due to sx.  Patient was last seen in primary care on 10/24/2023 by [name], MD.  Called Nurse Triage reporting Fatigue.  Symptoms began several days ago.  Interventions attempted: Prescription medications: amlodipine  .  Symptoms are: gradually worsening.  Triage Disposition: See HCP Within 4 Hours (Or PCP Triage)  Patient/caregiver understands and will follow disposition?: Unsure  Please advise if patient can be seen today to check BP.       Copied from CRM [id number]. Topic: Clinical - Red Word Triage >> Dec 23, 2023 10:53 AM Kiara Ramirez wrote: Reason for RMF:aonni pressure has been low and she is weak Reason for Disposition  [1] Systolic BP 90-110 AND [2] taking blood pressure medications AND [3] feeling weak or lightheaded  Answer Assessment - Initial Assessment Questions Recommended mobile bus or UC. No available appt today as recommended with any provider. Please advise regarding patient taking amlodipine  . Patient reports she did not take this am but continued to feel weak. No dizziness no feeling faint. Reports BP has been low over weekend and did not remember today. Recommended hydration and if sx worsen go to ED. Please advise if patient can be seen today , patient gets off of work at 1 pm.     1. BLOOD PRESSURE: What is your blood pressure? Did you take at least two measurements 5 minutes apart?     Could not remember 2. ONSET: When did you take your blood pressure?     This am  3. HOW: How did you take your blood pressure? (e.g., visiting nurse, automatic home BP monitor)     Automatic home BP monitor  4. HISTORY: Do you have a history of low blood pressure? What is your blood pressure normally?     Na  5. MEDICINES: Are you taking any medicines  for blood pressure? If Yes, ask: Have they been changed recently?     Yes amlodipine . Patient reports she did not take this am due to sx  6. PULSE RATE: Do you know what your pulse rate is?      Na  7. OTHER SYMPTOMS: Have you been sick recently? Have you had a recent injury?     No  8. PREGNANCY: Is there any chance you are pregnant? When was your last menstrual period?     Na  Protocols used: Blood Pressure - Low-A-AH

## 2023-12-24 ENCOUNTER — Ambulatory Visit: Admitting: Family Medicine

## 2023-12-24 ENCOUNTER — Encounter: Payer: Self-pay | Admitting: Family Medicine

## 2023-12-24 VITALS — BP 110/78 | HR 78 | Temp 98.1°F | Wt 216.0 lb

## 2023-12-24 DIAGNOSIS — I10 Essential (primary) hypertension: Secondary | ICD-10-CM | POA: Diagnosis not present

## 2023-12-24 MED ORDER — AMLODIPINE BESYLATE 5 MG PO TABS: 2.5000 mg | ORAL_TABLET | Freq: Every day | ORAL | Status: DC

## 2023-12-24 NOTE — Telephone Encounter (Signed)
 Pt has appointment with Dr Johnny for this problem

## 2023-12-24 NOTE — Progress Notes (Signed)
   Subjective:    Patient ID: Kiara Ramirez, female    DOB: October 03, 1950, 73 y.o.   MRN: [id number]  HPI Over the past few weeks she has had days where she felt very tired and sluggish, and then she has felt fine on other days. She has been tracking her BP, and these readings are often in the 100-120 range systolic. Pulses have been normal. No SOB.    Review of Systems  Constitutional:  Positive for fatigue.  Respiratory: Negative.    Cardiovascular: Negative.   Neurological: Negative.        Objective:   Physical Exam Constitutional:      Appearance: She is obese. She is not ill-appearing.  Cardiovascular:     Rate and Rhythm: Normal rate and regular rhythm.     Pulses: Normal pulses.     Heart sounds: Normal heart sounds.  Pulmonary:     Effort: Pulmonary effort is normal.     Breath sounds: Normal breath sounds.  Musculoskeletal:     Right lower leg: No edema.     Left lower leg: No edema.  Neurological:     Mental Status: She is alert.           Assessment & Plan:  Her HTN has been a bit overtreated. We will reduce the Amlodipine  to 2.5 mg daily. Recheck in 3-4 weeks. Garnette Olmsted, MD

## 2023-12-24 NOTE — Addendum Note (Signed)
 Addended by: JOHNNY SENIOR A on: 12/24/2023 05:08 PM   Modules accepted: Orders

## 2024-01-13 ENCOUNTER — Encounter: Payer: Self-pay | Admitting: Family Medicine

## 2024-01-13 ENCOUNTER — Ambulatory Visit: Admitting: Family Medicine

## 2024-01-13 VITALS — BP 118/80 | HR 93 | Temp 98.1°F | Ht 62.0 in | Wt 218.0 lb

## 2024-01-13 DIAGNOSIS — E538 Deficiency of other specified B group vitamins: Secondary | ICD-10-CM | POA: Diagnosis not present

## 2024-01-13 DIAGNOSIS — R600 Localized edema: Secondary | ICD-10-CM

## 2024-01-13 DIAGNOSIS — Z0000 Encounter for general adult medical examination without abnormal findings: Secondary | ICD-10-CM

## 2024-01-13 DIAGNOSIS — Z23 Encounter for immunization: Secondary | ICD-10-CM | POA: Diagnosis not present

## 2024-01-13 LAB — BASIC METABOLIC PANEL WITH GFR
BUN: 15 mg/dL (ref 6–23)
CO2: 31 meq/L (ref 19–32)
Calcium: 8.9 mg/dL (ref 8.4–10.5)
Chloride: 103 meq/L (ref 96–112)
Creatinine, Ser: 0.87 mg/dL (ref 0.40–1.20)
GFR: 66.68 mL/min (ref >60.00)
Glucose, Bld: 78 mg/dL (ref 70–99)
Potassium: 3.9 meq/L (ref 3.5–5.1)
Sodium: 139 meq/L (ref 135–145)

## 2024-01-13 LAB — CBC WITH DIFFERENTIAL/PLATELET
Basophils Absolute: 0.1 K/uL (ref 0.0–0.1)
Basophils Relative: 0.8 % (ref 0.0–3.0)
Eosinophils Absolute: 0.1 K/uL (ref 0.0–0.7)
Eosinophils Relative: 1.3 % (ref 0.0–5.0)
HCT: 37.6 % (ref 36.0–46.0)
Hemoglobin: 12.2 g/dL (ref 12.0–15.0)
Lymphocytes Relative: 31 % (ref 12.0–46.0)
Lymphs Abs: 2 K/uL (ref 0.7–4.0)
MCHC: 32.5 g/dL (ref 30.0–36.0)
MCV: 89.2 fl (ref 78.0–100.0)
Monocytes Absolute: 0.4 K/uL (ref 0.1–1.0)
Monocytes Relative: 6.5 % (ref 3.0–12.0)
Neutro Abs: 3.9 K/uL (ref 1.4–7.7)
Neutrophils Relative %: 60.4 % (ref 43.0–77.0)
Platelets: 349 K/uL (ref 150.0–400.0)
RBC: 4.22 Mil/uL (ref 3.87–5.11)
RDW: 14.6 % (ref 11.5–15.5)
WBC: 6.5 K/uL (ref 4.0–10.5)

## 2024-01-13 LAB — HEPATIC FUNCTION PANEL
ALT: 8 U/L (ref 0–35)
AST: 13 U/L (ref 0–37)
Albumin: 3.4 g/dL — ABNORMAL LOW (ref 3.5–5.2)
Alkaline Phosphatase: 85 U/L (ref 39–117)
Bilirubin, Direct: 0 mg/dL (ref 0.0–0.3)
Total Bilirubin: 0.2 mg/dL (ref 0.2–1.2)
Total Protein: 7.6 g/dL (ref 6.0–8.3)

## 2024-01-13 LAB — LIPID PANEL
Cholesterol: 117 mg/dL (ref 0–200)
HDL: 40.6 mg/dL (ref >39.00)
LDL Cholesterol: 65 mg/dL (ref 0–99)
NonHDL: 76.2
Total CHOL/HDL Ratio: 3
Triglycerides: 54 mg/dL (ref 0.0–149.0)
VLDL: 10.8 mg/dL (ref 0.0–40.0)

## 2024-01-13 LAB — HEMOGLOBIN A1C: Hgb A1c MFr Bld: 6 % (ref 4.6–6.5)

## 2024-01-13 LAB — VITAMIN B12: Vitamin B-12: >1500 pg/mL — ABNORMAL HIGH (ref 211–911)

## 2024-01-13 LAB — TSH: TSH: 1.24 u[IU]/mL (ref 0.35–5.50)

## 2024-01-13 MED ORDER — AMLODIPINE BESYLATE 2.5 MG PO TABS: 2.5000 mg | ORAL_TABLET | Freq: Every day | ORAL | 3 refills | Status: AC

## 2024-01-13 NOTE — Progress Notes (Addendum)
 Subjective:    Patient ID: Kiara Ramirez, female    DOB: 1950/10/28, 73 y.o.   MRN: [id number]  HPI Here for a well exam. She feels well in general. She has had intermittent swelling in both legs for several years, the left being more than the right. Sometimes her legs feel tight, but there is no pain. She sometimes wears compression stockings. She had venous US  of the legs in 2018 and 2021 which were normal. No SOB.    Review of Systems  Constitutional: Negative.   HENT: Negative.    Eyes: Negative.   Respiratory: Negative.    Cardiovascular:  Positive for leg swelling.  Gastrointestinal: Negative.   Genitourinary:  Negative for decreased urine volume, difficulty urinating, dyspareunia, dysuria, enuresis, flank pain, frequency, hematuria, pelvic pain and urgency.  Musculoskeletal: Negative.   Skin: Negative.   Neurological: Negative.  Negative for headaches.  Psychiatric/Behavioral: Negative.         Objective:   Physical Exam Constitutional:      General: She is not in acute distress.    Appearance: She is well-developed. She is obese.  HENT:     Head: Normocephalic and atraumatic.     Right Ear: External ear normal.     Left Ear: External ear normal.     Nose: Nose normal.     Mouth/Throat:     Pharynx: No oropharyngeal exudate.  Eyes:     General: No scleral icterus.    Conjunctiva/sclera: Conjunctivae normal.     Pupils: Pupils are equal, round, and reactive to light.  Neck:     Thyroid : No thyromegaly.     Vascular: No JVD.  Cardiovascular:     Rate and Rhythm: Normal rate and regular rhythm.     Pulses: Normal pulses.     Heart sounds: Normal heart sounds. No murmur heard.    No friction rub. No gallop.  Pulmonary:     Effort: Pulmonary effort is normal. No respiratory distress.     Breath sounds: Normal breath sounds. No wheezing or rales.  Chest:     Chest wall: No tenderness.  Abdominal:     General: Bowel sounds are normal. There is no distension.      Palpations: Abdomen is soft. There is no mass.     Tenderness: There is no abdominal tenderness. There is no guarding or rebound.  Musculoskeletal:        General: No tenderness. Normal range of motion.     Cervical back: Normal range of motion and neck supple.     Comments: 1+ edema in the right leg and 2+ edema in the left leg   Lymphadenopathy:     Cervical: No cervical adenopathy.  Skin:    General: Skin is warm and dry.     Findings: No erythema or rash.  Neurological:     General: No focal deficit present.     Mental Status: She is alert and oriented to person, place, and time.     Cranial Nerves: No cranial nerve deficit.     Motor: No abnormal muscle tone.     Coordination: Coordination normal.     Deep Tendon Reflexes: Reflexes are normal and symmetric. Reflexes normal.  Psychiatric:        Mood and Affect: Mood normal.        Behavior: Behavior normal.        Thought Content: Thought content normal.        Judgment: Judgment normal.  Assessment & Plan:  Well exam. We discussed diet and exercise. Get fasting labs. For the leg swelling, we will set up an ECHO. We will also decrease the Amlodipine  to 2.5 mg daily.  Garnette Olmsted, MD

## 2024-01-13 NOTE — Addendum Note (Signed)
 Addended by: LADONNA INOCENTE SAILOR on: 01/13/2024 09:52 AM   Modules accepted: Orders

## 2024-01-14 ENCOUNTER — Ambulatory Visit: Payer: Self-pay | Admitting: Family Medicine

## 2024-02-12 NOTE — Progress Notes (Unsigned)
 [facility] [address] [place] [address] Phone: [phone] Subjective:   Kiara Ramirez, am serving as a scribe for Dr. Arthea Claudene.  I'm seeing this patient by the request  of:  Kiara Garnette LABOR, MD  CC: Left knee pain  YEP:Dlagzrupcz  11/07/2023 Patient does have significant arthritic changes but has done really well with the injection.  Discussed with patient to continue to stay active.  I think that this has been significantly helpful.  Discussed which activities to do and which ones to avoid.  Increase activity slowly.  Follow-up with me again in 3 months if needed.      Update 02/13/2024 Kiara Ramirez is a 73 y.o. female coming in with complaint of L knee pain.  Last seen 3 months ago.  Patient was doing much better at that time.  Last injection was in April 2025.  Patient states that she has been doing well.       Past Medical History:  Diagnosis Date   Allergy    Anemia    Anxiety    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 meds   B12 deficiency    CKD (chronic kidney disease) stage 3, GFR 30-59 ml/min (HCC)    sees Dr. Almarie Bonine   GERD (gastroesophageal reflux disease)    Gout    Hypertension    Past Surgical History:  Procedure Laterality Date   COLONOSCOPY  12/16/2018   per Dr. Aneita, incomplete exam     COLONOSCOPY  02/05/2022   Cologuard test was negative   CYSTOSCOPY  04/30/2018   Procedure: CYSTOSCOPY;  Surgeon: Henry Slough, MD;  Location: [facility];  Service: Gynecology;;   DG BARIUM ENEMA ([facility] HX)  01/18/2019   per Dr. Aneita, incomplete exam, get CT colonography in 5 years    HYSTERECTOMY ABDOMINAL WITH SALPINGO-OOPHORECTOMY Bilateral 04/30/2018   Procedure: TOTAL ABDOMINAL HYSTERECTOMY WITH BILATERAL SALPINGO-OOPHORECTOMY;  Surgeon: Henry Slough, MD;  Location: [facility];  Service: Gynecology;  Laterality: Bilateral;   LAPAROSCOPY  04/30/2018   Procedure: LAPAROSCOPY DIAGNOSTIC;  Surgeon: Henry Slough, MD;   Location: [facility];  Service: Gynecology;;   TUBAL LIGATION     Social History   Socioeconomic History   Marital status: Married    Spouse name: Not on file   Number of children: 2   Years of education: 13   Highest education level: Not on file  Occupational History    Employer: [employer]  Tobacco Use   Smoking status: Never   Smokeless tobacco: Never  Vaping Use   Vaping status: Never Used  Substance and Sexual Activity   Alcohol use: No    Alcohol/week: 0.0 standard drinks of alcohol   Drug use: No   Sexual activity: Yes    Birth control/protection: Post-menopausal  Other Topics Concern   Not on file  Social History Narrative   Lives with husband in a one story home.  Has 2 children.   23 year old grandson lives with her.    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     Education: one year of college.    Social Drivers of Corporate Investment Banker Strain: Low Risk  (04/18/2023)   Overall Financial Resource Strain (CARDIA)    Difficulty of Paying Living Expenses: Not hard at all  Food Insecurity: No Food Insecurity (04/18/2023)   Hunger Vital Sign    Worried About Running Out of Food in the Last Year:  Never true    Ran Out of Food in the Last Year: Never true  Transportation Needs: No Transportation Needs (04/18/2023)   PRAPARE - Administrator, Civil Service (Medical): No    Lack of Transportation (Non-Medical): No  Physical Activity: Inactive (04/18/2023)   Exercise Vital Sign    Days of Exercise per Week: 0 days    Minutes of Exercise per Session: 0 min  Stress: No Stress Concern Present (04/18/2023)   [name]    Feeling of Stress : Not at all  Social Connections: Socially Integrated (04/18/2023)   Social Connection and Isolation Panel    Frequency of Communication with Friends and Family: More than three times a week    Frequency of Social Gatherings with Friends and Family: More than three  times a week    Attends Religious Services: More than 4 times per year    Active Member of [employer] or Organizations: Yes    Attends Engineer, Structural: More than 4 times per year    Marital Status: Married   Allergies  Allergen Reactions   Ace Inhibitors Swelling   Hydrocodone     Hydrocodone -Acetaminophen  Nausea Only   Oxycodone Nausea And Vomiting    Headache   Family History  Problem Relation Age of Onset   Heart failure Mother        No details   Anemia Father    Breast cancer Sister    Cancer Other        colon/endometrial   Hypertension Other        family hx   Arthritis Other        family hx   Diabetes Other        family hx   Colon cancer Neg Hx    Esophageal cancer Neg Hx    Stomach cancer Neg Hx    Rectal cancer Neg Hx      Current Outpatient Medications (Cardiovascular):    amLODipine  (NORVASC ) 2.5 MG tablet, Take 1 tablet (2.5 mg total) by mouth daily.   Current Outpatient Medications (Analgesics):    aspirin 81 MG tablet, Take 81 mg by mouth daily.   colchicine  0.6 MG tablet, Take 1 tablet (0.6 mg total) by mouth every 6 (six) hours as needed (gout pain).  Current Outpatient Medications (Hematological):    Cyanocobalamin  (VITAMIN B-12 PO), Take 1,000 mcg by mouth daily.  Current Outpatient Medications (Other):    clobetasol ointment (TEMOVATE) 0.05 %, clobetasol 0.05 % topical ointment  APPLY A THIN LAYER TO THE AFFECTED AREA TWICE DAILY FOR 2 WEEKS   lidocaine  (XYLOCAINE ) 2 % solution, Use as directed 15 mLs in the mouth or throat as needed for mouth pain.   potassium chloride  (KLOR-CON ) 10 MEQ tablet, Take 2 tablets by mouth twice daily   Tart Cherry 1200 MG CAPS, Take by mouth.   Turmeric (QC TUMERIC COMPLEX PO), Take by mouth.   Objective  Blood pressure 128/82, pulse 84, height 5' 2 (1.575 m), weight 216 lb (98 kg).   General: No apparent distress alert and oriented x3 mood and affect normal, dressed appropriately.  HEENT: Pupils  equal, extraocular movements intact  Respiratory: Patient's speak in full sentences and does not appear short of breath  Cardiovascular: No lower extremity edema, non tender, no erythema  Knee exam shows still some mild crepitus noted.  Some lateral tracking of the patella noted.  No significant instability noted.  Mild varus deformity  noted    Impression and Recommendations:    The above documentation has been reviewed and is accurate and complete Nobuo Nunziata M Krishay Faro, DO

## 2024-02-13 ENCOUNTER — Ambulatory Visit: Admitting: Family Medicine

## 2024-02-13 VITALS — BP 128/82 | HR 84 | Ht 62.0 in | Wt 216.0 lb

## 2024-02-13 DIAGNOSIS — M1712 Unilateral primary osteoarthritis, left knee: Secondary | ICD-10-CM

## 2024-02-13 NOTE — Patient Instructions (Signed)
 Great to see you Kiara Ramirez or [name] recovery in the house See me in April

## 2024-02-13 NOTE — Assessment & Plan Note (Signed)
 Continues to do fabulous at this time.  Encouraged weight loss still and would like her to get ideally at least less than 200 pounds.  Follow-up with me again in 6 months

## 2024-02-20 ENCOUNTER — Ambulatory Visit (HOSPITAL_BASED_OUTPATIENT_CLINIC_OR_DEPARTMENT_OTHER)

## 2024-02-20 DIAGNOSIS — R600 Localized edema: Secondary | ICD-10-CM | POA: Diagnosis not present

## 2024-02-23 LAB — ECHOCARDIOGRAM COMPLETE
AR max vel: 2.15 cm2
AV Area VTI: 2.23 cm2
AV Area mean vel: 2.13 cm2
AV Mean grad: 3 mmHg
AV Peak grad: 6.3 mmHg
Ao pk vel: 1.25 m/s
Area-P 1/2: 3.46 cm2
S' Lateral: 2.37 cm

## 2024-04-21 ENCOUNTER — Ambulatory Visit: Payer: PPO

## 2024-04-21 ENCOUNTER — Ambulatory Visit: Admitting: Family Medicine

## 2024-04-21 ENCOUNTER — Encounter: Payer: Self-pay | Admitting: Family Medicine

## 2024-04-21 VITALS — BP 120/62 | HR 83 | Temp 98.0°F | Wt 219.0 lb

## 2024-04-21 VITALS — BP 120/62 | HR 83 | Temp 98.0°F | Ht 62.0 in | Wt 219.2 lb

## 2024-04-21 DIAGNOSIS — Z0000 Encounter for general adult medical examination without abnormal findings: Secondary | ICD-10-CM

## 2024-04-21 DIAGNOSIS — R059 Cough, unspecified: Secondary | ICD-10-CM | POA: Diagnosis not present

## 2024-04-21 DIAGNOSIS — J069 Acute upper respiratory infection, unspecified: Secondary | ICD-10-CM

## 2024-04-21 LAB — POC COVID19 BINAXNOW: SARS Coronavirus 2 Ag: NEGATIVE

## 2024-04-21 LAB — POCT INFLUENZA A/B
Influenza A, POC: NEGATIVE
Influenza B, POC: NEGATIVE

## 2024-04-21 NOTE — Progress Notes (Signed)
" ° °  Subjective:    Patient ID: Mont Ada, female    DOB: [birth year], 74 y.o.   MRN: [id number]  HPI Here for 4 days of stuffy head, PND, and a dry cough. No fever or ST or SOB.    Review of Systems  Constitutional: Negative.   HENT:  Positive for congestion and postnasal drip. Negative for ear pain, sinus pain and sore throat.   Eyes: Negative.   Respiratory:  Positive for cough. Negative for shortness of breath and wheezing.   Gastrointestinal: Negative.        Objective:   Physical Exam Constitutional:      Appearance: Normal appearance.  HENT:     Right Ear: Tympanic membrane, ear canal and external ear normal.     Left Ear: Tympanic membrane, ear canal and external ear normal.     Nose: Nose normal.     Mouth/Throat:     Pharynx: Oropharynx is clear.  Eyes:     Conjunctiva/sclera: Conjunctivae normal.  Pulmonary:     Effort: Pulmonary effort is normal.     Breath sounds: Normal breath sounds.  Lymphadenopathy:     Cervical: No cervical adenopathy.  Neurological:     Mental Status: She is alert.           Assessment & Plan:  Viral URI, she can use Robitussin as needed. This should resolve in the next few days.  Garnette Olmsted, MD   "

## 2024-04-21 NOTE — Progress Notes (Signed)
 "  No chief complaint on file.    Subjective:   Kiara Ramirez is a 74 y.o. female who presents for a Medicare Annual Wellness Visit.  Visit info / Clinical Intake: Medicare Wellness Visit Type:: Subsequent Annual Wellness Visit Persons participating in visit and providing information:: patient Medicare Wellness Visit Mode:: In-person (required for WTM) Interpreter Needed?: No Pre-visit prep was completed: yes AWV questionnaire completed by patient prior to visit?: no Living arrangements:: lives with spouse/significant other Patient's Overall Health Status Rating: good Typical amount of pain: none Does pain affect daily life?: no Are you currently prescribed opioids?: no  Dietary Habits and Nutritional Risks How many meals a day?: 3 Eats fruit and vegetables daily?: yes Most meals are obtained by: preparing own meals In the last 2 weeks, have you had any of the following?: none Diabetic:: no  Functional Status Activities of Daily Living (to include ambulation/medication): Independent Ambulation: Independent with device- listed below Home Assistive Devices/Equipment: Eyeglasses Medication Administration: Independent Home Management (perform basic housework or laundry): Independent Manage your own finances?: yes Primary transportation is: driving Concerns about vision?: no *vision screening is required for WTM* Concerns about hearing?: no  Fall Screening Falls in the past year?: 0 Number of falls in past year: 0 Was there an injury with Fall?: 0 Fall Risk Category Calculator: 0 Patient Fall Risk Level: Low Fall Risk  Fall Risk Patient at Risk for Falls Due to: No Fall Risks Fall risk Follow up: Falls evaluation completed  Home and Transportation Safety: All rugs have non-skid backing?: yes All stairs or steps have railings?: N/A, no stairs Grab bars in the bathtub or shower?: (!) no Have non-skid surface in bathtub or shower?: yes Good home lighting?: yes Regular  seat belt use?: yes Hospital stays in the last year:: no  Cognitive Assessment Difficulty concentrating, remembering, or making decisions? : no Will 6CIT or Mini Cog be Completed: yes What year is it?: 0 points What month is it?: 0 points Give patient an address phrase to remember (5 components): [address] [place]  About what time is it?: 0 points Count backwards from 20 to 1: 0 points Say the months of the year in reverse: 0 points Repeat the address phrase from earlier: 0 points 6 CIT Score: 0 points  Advance Directives (For Healthcare) Does Patient Have a Medical Advance Directive?: No Would patient like information on creating a medical advance directive?: No - Patient declined  Reviewed/Updated  Reviewed/Updated: Reviewed All (Medical, Surgical, Family, Medications, Allergies, Care Teams, Patient Goals)    Allergies (verified) Ace inhibitors, Hydrocodone , Hydrocodone -acetaminophen , and Oxycodone   Current Medications (verified) Outpatient Encounter Medications as of 04/21/2024  Medication Sig   amLODipine  (NORVASC ) 2.5 MG tablet Take 1 tablet (2.5 mg total) by mouth daily.   aspirin 81 MG tablet Take 81 mg by mouth daily.   clobetasol ointment (TEMOVATE) 0.05 % clobetasol 0.05 % topical ointment  APPLY A THIN LAYER TO THE AFFECTED AREA TWICE DAILY FOR 2 WEEKS   colchicine  0.6 MG tablet Take 1 tablet (0.6 mg total) by mouth every 6 (six) hours as needed (gout pain).   Cyanocobalamin  (VITAMIN B-12 PO) Take 1,000 mcg by mouth daily.   lidocaine  (XYLOCAINE ) 2 % solution Use as directed 15 mLs in the mouth or throat as needed for mouth pain.   potassium chloride  (KLOR-CON ) 10 MEQ tablet Take 2 tablets by mouth twice daily   Tart Cherry 1200 MG CAPS Take by mouth.   Turmeric (QC TUMERIC COMPLEX  PO) Take by mouth.   No facility-administered encounter medications on file as of 04/21/2024.    History: Past Medical History:  Diagnosis Date   Allergy    Anemia     Anxiety    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 meds   B12 deficiency    CKD (chronic kidney disease) stage 3, GFR 30-59 ml/min (HCC)    sees Dr. Almarie Bonine   GERD (gastroesophageal reflux disease)    Gout    Hypertension    Past Surgical History:  Procedure Laterality Date   COLONOSCOPY  12/16/2018   per Dr. Aneita, incomplete exam     COLONOSCOPY  02/05/2022   Cologuard test was negative   CYSTOSCOPY  04/30/2018   Procedure: CYSTOSCOPY;  Surgeon: Henry Slough, MD;  Location: [facility];  Service: Gynecology;;   DG BARIUM ENEMA ([facility] HX)  01/18/2019   per Dr. Aneita, incomplete exam, get CT colonography in 5 years    HYSTERECTOMY ABDOMINAL WITH SALPINGO-OOPHORECTOMY Bilateral 04/30/2018   Procedure: TOTAL ABDOMINAL HYSTERECTOMY WITH BILATERAL SALPINGO-OOPHORECTOMY;  Surgeon: Henry Slough, MD;  Location: [facility];  Service: Gynecology;  Laterality: Bilateral;   LAPAROSCOPY  04/30/2018   Procedure: LAPAROSCOPY DIAGNOSTIC;  Surgeon: Henry Slough, MD;  Location: [facility];  Service: Gynecology;;   TUBAL LIGATION     Family History  Problem Relation Age of Onset   Heart failure Mother        No details   Anemia Father    Breast cancer Sister    Cancer Other        colon/endometrial   Hypertension Other        family hx   Arthritis Other        family hx   Diabetes Other        family hx   Colon cancer Neg Hx    Esophageal cancer Neg Hx    Stomach cancer Neg Hx    Rectal cancer Neg Hx    Social History   Occupational History    Employer: [employer]  Tobacco Use   Smoking status: Never   Smokeless tobacco: Never  Vaping Use   Vaping status: Never Used  Substance and Sexual Activity   Alcohol use: No    Alcohol/week: 0.0 standard drinks of alcohol   Drug use: No   Sexual activity: Yes    Birth control/protection: Post-menopausal   Tobacco Counseling Counseling given: No  SDOH Screenings   Food Insecurity: No Food Insecurity (04/21/2024)  Housing: Unknown (04/21/2024)   Transportation Needs: No Transportation Needs (04/21/2024)  Utilities: Not At Risk (04/21/2024)  Alcohol Screen: Low Risk (04/18/2023)  Depression (PHQ2-9): Low Risk (04/21/2024)  Financial Resource Strain: Low Risk (04/18/2023)  Physical Activity: Inactive (04/21/2024)  Social Connections: Socially Integrated (04/21/2024)  Stress: No Stress Concern Present (04/21/2024)  Tobacco Use: Low Risk (04/21/2024)  Health Literacy: Adequate Health Literacy (04/21/2024)   See flowsheets for full screening details  Depression Screen PHQ 2 & 9 Depression Scale- Over the past 2 weeks, how often have you been bothered by any of the following problems? Little interest or pleasure in doing things: 0 Feeling down, depressed, or hopeless (PHQ Adolescent also includes...irritable): 0 PHQ-2 Total Score: 0     Goals Addressed               This Visit's Progress     Increase physical activity (pt-stated)        Remain active!  Objective:    Today's Vitals   04/21/24 1123  BP: 120/62  Pulse: 83  Temp: 98 F (36.7 C)  TempSrc: Oral  SpO2: 94%  Weight: 219 lb 3.2 oz (99.4 kg)  Height: 5' 2 (1.575 m)   Body mass index is 40.09 kg/m.  Hearing/Vision screen Hearing Screening - Comments:: Denies hearing difficulties   Vision Screening - Comments:: Wears rx glasses - up to date with routine eye exams with  [employer] Immunizations and Health Maintenance Health Maintenance  Topic Date Due   Hepatitis C Screening  Never done   Zoster Vaccines- Shingrix (1 of 2) Never done   COVID-19 Vaccine (5 - 2025-26 season) 12/15/2023   Medicare Annual Wellness (AWV)  04/21/2025   Mammogram  08/07/2025   Colonoscopy  12/15/2028   Pneumococcal Vaccine: 50+ Years  Completed   Influenza Vaccine  Completed   Bone Density Scan  Completed   Meningococcal B Vaccine  Aged Out   DTaP/Tdap/Td  Discontinued        Assessment/Plan:  This is a routine wellness examination for Kiara Ramirez.  Patient Care  Team: [name], MD as PCP - General  I have personally reviewed and noted the following in the patients chart:   Medical and social history Use of alcohol, tobacco or illicit drugs  Current medications and supplements including opioid prescriptions. Functional ability and status Nutritional status Physical activity Advanced directives List of other physicians Hospitalizations, surgeries, and ER visits in previous 12 months Vitals Screenings to include cognitive, depression, and falls Referrals and appointments  No orders of the defined types were placed in this encounter.  In addition, I have reviewed and discussed with patient certain preventive protocols, quality metrics, and best practice recommendations. A written personalized care plan for preventive services as well as general preventive health recommendations were provided to patient.   Rojelio LELON Blush, LPN   11/15/7971   Return in 1 year (on 04/29/2025).  After Visit Summary: (In Person-Printed) AVS printed and given to the patient  Nurse Notes: No voiced or noted concerns at this time "

## 2024-04-21 NOTE — Patient Instructions (Addendum)
 [name],  Thank you for taking the time for your Medicare Wellness Visit. I appreciate your continued commitment to your health goals. Please review the care plan we discussed, and feel free to reach out if I can assist you further.  Please note that Annual Wellness Visits do not include a physical exam. Some assessments may be limited, especially if the visit was conducted virtually. If needed, we may recommend an in-person follow-up with your provider.  Ongoing Care Seeing your primary care provider every 3 to 6 months helps us  monitor your health and provide consistent, personalized care.   Referrals If a referral was made during today's visit and you haven't received any updates within two weeks, please contact the referred provider directly to check on the status.  Recommended Screenings:  Health Maintenance  Topic Date Due   Hepatitis C Screening  Never done   Zoster (Shingles) Vaccine (1 of 2) Never done   COVID-19 Vaccine (5 - 2025-26 season) 12/15/2023   Medicare Annual Wellness Visit  04/21/2025   Breast Cancer Screening  08/07/2025   Colon Cancer Screening  12/15/2028   Pneumococcal Vaccine for age over 30  Completed   Flu Shot  Completed   Osteoporosis screening with Bone Density Scan  Completed   Meningitis B Vaccine  Aged Out   DTaP/Tdap/Td vaccine  Discontinued       04/21/2024   11:34 AM  Advanced Directives  Does Patient Have a Medical Advance Directive? No  Would patient like information on creating a medical advance directive? No - Patient declined    Vision: Annual vision screenings are recommended for early detection of glaucoma, cataracts, and diabetic retinopathy. These exams can also reveal signs of chronic conditions such as diabetes and high blood pressure.  Dental: Annual dental screenings help detect early signs of oral cancer, gum disease, and other conditions linked to overall health, including heart disease and diabetes.  Please see the attached  documents for additional preventive care recommendations.

## 2024-07-30 ENCOUNTER — Ambulatory Visit: Admitting: Family Medicine

## 2025-04-29 ENCOUNTER — Ambulatory Visit
